# Patient Record
Sex: Male | Born: 1950 | Race: White | Hispanic: No | Marital: Married | Smoking: Current every day smoker
Health system: Southern US, Community
[De-identification: ages and names within clinical notes are randomized; demographics above are authoritative.]

## PROBLEM LIST (undated history)

## (undated) DIAGNOSIS — M109 Gout, unspecified: Secondary | ICD-10-CM

## (undated) DIAGNOSIS — E785 Hyperlipidemia, unspecified: Secondary | ICD-10-CM

## (undated) DIAGNOSIS — I480 Paroxysmal atrial fibrillation: Secondary | ICD-10-CM

## (undated) DIAGNOSIS — I10 Essential (primary) hypertension: Secondary | ICD-10-CM

## (undated) DIAGNOSIS — Z86010 Personal history of colonic polyps: Principal | ICD-10-CM

## (undated) DIAGNOSIS — Z87898 Personal history of other specified conditions: Secondary | ICD-10-CM

## (undated) DIAGNOSIS — E669 Obesity, unspecified: Secondary | ICD-10-CM

## (undated) DIAGNOSIS — R42 Dizziness and giddiness: Secondary | ICD-10-CM

## (undated) DIAGNOSIS — E119 Type 2 diabetes mellitus without complications: Secondary | ICD-10-CM

## (undated) DIAGNOSIS — H269 Unspecified cataract: Secondary | ICD-10-CM

## (undated) HISTORY — PX: CATARACT EXTRACTION: SUR2

## (undated) HISTORY — PX: CHOLECYSTECTOMY: SHX55

## (undated) HISTORY — DX: Essential (primary) hypertension: I10

## (undated) HISTORY — DX: Hyperlipidemia, unspecified: E78.5

## (undated) HISTORY — DX: Gout, unspecified: M10.9

## (undated) HISTORY — DX: Dizziness and giddiness: R42

## (undated) HISTORY — DX: Personal history of other specified conditions: Z87.898

## (undated) HISTORY — DX: Unspecified cataract: H26.9

## (undated) HISTORY — DX: Type 2 diabetes mellitus without complications: E11.9

## (undated) HISTORY — PX: COLONOSCOPY: SHX174

## (undated) HISTORY — DX: Personal history of colonic polyps: Z86.010

## (undated) HISTORY — DX: Paroxysmal atrial fibrillation: I48.0

## (undated) HISTORY — DX: Obesity, unspecified: E66.9

---

## 2000-06-05 ENCOUNTER — Encounter: Admission: RE | Admit: 2000-06-05 | Discharge: 2000-09-03 | Payer: Self-pay | Admitting: Internal Medicine

## 2004-07-06 ENCOUNTER — Ambulatory Visit: Payer: Self-pay | Admitting: Internal Medicine

## 2004-07-21 ENCOUNTER — Ambulatory Visit: Payer: Self-pay | Admitting: Internal Medicine

## 2005-01-25 ENCOUNTER — Ambulatory Visit: Payer: Self-pay | Admitting: Internal Medicine

## 2005-02-01 ENCOUNTER — Ambulatory Visit: Payer: Self-pay | Admitting: Internal Medicine

## 2005-11-09 ENCOUNTER — Ambulatory Visit: Payer: Self-pay | Admitting: Internal Medicine

## 2006-12-01 ENCOUNTER — Encounter: Payer: Self-pay | Admitting: Internal Medicine

## 2006-12-01 DIAGNOSIS — Z87898 Personal history of other specified conditions: Secondary | ICD-10-CM

## 2006-12-01 DIAGNOSIS — M109 Gout, unspecified: Secondary | ICD-10-CM

## 2006-12-01 DIAGNOSIS — I10 Essential (primary) hypertension: Secondary | ICD-10-CM

## 2006-12-01 HISTORY — DX: Essential (primary) hypertension: I10

## 2006-12-01 HISTORY — DX: Personal history of other specified conditions: Z87.898

## 2006-12-01 HISTORY — DX: Gout, unspecified: M10.9

## 2007-01-15 ENCOUNTER — Ambulatory Visit: Payer: Self-pay | Admitting: Internal Medicine

## 2007-01-17 LAB — CONVERTED CEMR LAB
Albumin: 3.6 g/dL (ref 3.5–5.2)
Basophils Absolute: 0 10*3/uL (ref 0.0–0.1)
Basophils Relative: 0.4 % (ref 0.0–1.0)
Bilirubin Urine: NEGATIVE
Bilirubin Urine: NEGATIVE
Bilirubin, Direct: 0.2 mg/dL (ref 0.0–0.3)
CO2: 29 meq/L (ref 19–32)
Cholesterol: 134 mg/dL (ref 0–200)
Cholesterol: 134 mg/dL (ref 0–200)
Creatinine, Ser: 1.2 mg/dL (ref 0.4–1.5)
Eosinophils Absolute: 0.1 10*3/uL (ref 0.0–0.6)
Eosinophils Relative: 2.1 % (ref 0.0–5.0)
GFR calc Af Amer: 80 mL/min
GFR calc Af Amer: 80 mL/min
Glucose, Bld: 129 mg/dL — ABNORMAL HIGH (ref 70–99)
HCT: 39.9 % (ref 39.0–52.0)
HDL: 37.5 mg/dL — ABNORMAL LOW (ref >39.0)
Hemoglobin: 13.9 g/dL (ref 13.0–17.0)
Hemoglobin: 13.9 g/dL (ref 13.0–17.0)
Leukocytes, UA: NEGATIVE
Leukocytes, UA: NEGATIVE
Lymphocytes Relative: 31.5 % (ref 12.0–46.0)
Lymphocytes Relative: 31.5 % (ref 12.0–46.0)
MCHC: 34.9 g/dL (ref 30.0–36.0)
MCV: 94.4 fL (ref 78.0–100.0)
Monocytes Absolute: 0.4 10*3/uL (ref 0.2–0.7)
Monocytes Absolute: 0.4 10*3/uL (ref 0.2–0.7)
Monocytes Relative: 7.3 % (ref 3.0–11.0)
Neutro Abs: 3.4 10*3/uL (ref 1.4–7.7)
Neutro Abs: 3.4 10*3/uL (ref 1.4–7.7)
Neutrophils Relative %: 58.7 % (ref 43.0–77.0)
Nitrite: NEGATIVE
PSA: 1.28 ng/mL (ref 0.10–4.00)
PSA: 1.28 ng/mL (ref 0.10–4.00)
Potassium: 3.7 meq/L (ref 3.5–5.1)
Potassium: 3.7 meq/L (ref 3.5–5.1)
RDW: 12.6 % (ref 11.5–14.6)
Sodium: 139 meq/L (ref 135–145)
Specific Gravity, Urine: >=1.03 (ref 1.000–1.03)
TSH: 2.29 microintl units/mL (ref 0.35–5.50)
TSH: 2.29 microintl units/mL (ref 0.35–5.50)
Total Bilirubin: 0.8 mg/dL (ref 0.3–1.2)
Total Protein, Urine: NEGATIVE mg/dL
Total Protein: 6.9 g/dL (ref 6.0–8.3)
Triglycerides: 150 mg/dL — ABNORMAL HIGH (ref 0–149)
Triglycerides: 150 mg/dL — ABNORMAL HIGH (ref 0–149)
Urobilinogen, UA: 0.2 (ref 0.0–1.0)
WBC: 5.7 10*3/uL (ref 4.5–10.5)

## 2007-01-19 ENCOUNTER — Ambulatory Visit: Payer: Self-pay | Admitting: Internal Medicine

## 2007-01-19 DIAGNOSIS — E785 Hyperlipidemia, unspecified: Secondary | ICD-10-CM

## 2007-01-19 HISTORY — DX: Hyperlipidemia, unspecified: E78.5

## 2007-07-27 ENCOUNTER — Ambulatory Visit: Payer: Self-pay | Admitting: Internal Medicine

## 2007-07-27 DIAGNOSIS — E1165 Type 2 diabetes mellitus with hyperglycemia: Secondary | ICD-10-CM

## 2007-07-27 DIAGNOSIS — E118 Type 2 diabetes mellitus with unspecified complications: Secondary | ICD-10-CM

## 2007-07-27 DIAGNOSIS — E119 Type 2 diabetes mellitus without complications: Secondary | ICD-10-CM

## 2007-07-27 HISTORY — DX: Type 2 diabetes mellitus without complications: E11.9

## 2007-11-19 ENCOUNTER — Encounter: Payer: Self-pay | Admitting: Internal Medicine

## 2008-01-21 ENCOUNTER — Ambulatory Visit: Payer: Self-pay | Admitting: Internal Medicine

## 2008-01-21 LAB — CONVERTED CEMR LAB
ALT: 16 units/L (ref 0–53)
AST: 20 units/L (ref 0–37)
Albumin: 3.6 g/dL (ref 3.5–5.2)
Alkaline Phosphatase: 53 units/L (ref 39–117)
BUN: 17 mg/dL (ref 6–23)
Bilirubin, Direct: 0.1 mg/dL (ref 0.0–0.3)
CO2: 27 meq/L (ref 19–32)
Chloride: 108 meq/L (ref 96–112)
Eosinophils Relative: 2.3 % (ref 0.0–5.0)
Glucose, Bld: 123 mg/dL — ABNORMAL HIGH (ref 70–99)
HCT: 38.9 % — ABNORMAL LOW (ref 39.0–52.0)
Leukocytes, UA: NEGATIVE
Lymphocytes Relative: 27.6 % (ref 12.0–46.0)
Monocytes Absolute: 0.4 10*3/uL (ref 0.1–1.0)
Monocytes Relative: 7.2 % (ref 3.0–12.0)
Neutrophils Relative %: 62.1 % (ref 43.0–77.0)
Nitrite: NEGATIVE
Platelets: 199 10*3/uL (ref 150–400)
Potassium: 4.4 meq/L (ref 3.5–5.1)
TSH: 2.13 microintl units/mL (ref 0.35–5.50)
Total Protein, Urine: NEGATIVE mg/dL
Total Protein: 7 g/dL (ref 6.0–8.3)
VLDL: 25 mg/dL (ref 0–40)
WBC: 5.2 10*3/uL (ref 4.5–10.5)

## 2008-01-24 ENCOUNTER — Ambulatory Visit: Payer: Self-pay | Admitting: Internal Medicine

## 2008-02-11 ENCOUNTER — Telehealth: Payer: Self-pay | Admitting: Internal Medicine

## 2008-03-05 ENCOUNTER — Encounter: Payer: Self-pay | Admitting: Internal Medicine

## 2008-05-26 ENCOUNTER — Ambulatory Visit: Payer: Self-pay | Admitting: Internal Medicine

## 2008-05-30 ENCOUNTER — Ambulatory Visit: Payer: Self-pay | Admitting: Internal Medicine

## 2008-05-30 ENCOUNTER — Encounter: Payer: Self-pay | Admitting: Internal Medicine

## 2008-05-30 DIAGNOSIS — Z860100 Personal history of colon polyps, unspecified: Secondary | ICD-10-CM | POA: Insufficient documentation

## 2008-05-30 HISTORY — DX: Personal history of colon polyps, unspecified: Z86.0100

## 2008-05-30 HISTORY — DX: Personal history of colonic polyps: Z86.010

## 2008-05-30 LAB — HM COLONOSCOPY

## 2008-06-03 ENCOUNTER — Encounter: Payer: Self-pay | Admitting: Internal Medicine

## 2008-07-22 ENCOUNTER — Ambulatory Visit: Payer: Self-pay | Admitting: Internal Medicine

## 2008-07-22 DIAGNOSIS — R21 Rash and other nonspecific skin eruption: Secondary | ICD-10-CM | POA: Insufficient documentation

## 2008-07-22 LAB — CONVERTED CEMR LAB
CO2: 30 meq/L (ref 19–32)
Chloride: 106 meq/L (ref 96–112)
Cholesterol: 127 mg/dL (ref 0–200)
HDL: 45 mg/dL (ref >39.00)
Hgb A1c MFr Bld: 6.6 % — ABNORMAL HIGH (ref 4.6–6.5)
LDL Cholesterol: 58 mg/dL (ref 0–99)
Sodium: 141 meq/L (ref 135–145)
Total CHOL/HDL Ratio: 3
Triglycerides: 118 mg/dL (ref 0.0–149.0)

## 2009-01-14 ENCOUNTER — Ambulatory Visit: Payer: Self-pay | Admitting: Internal Medicine

## 2009-01-14 LAB — CONVERTED CEMR LAB
ALT: 13 units/L (ref 0–53)
AST: 17 units/L (ref 0–37)
Albumin: 3.9 g/dL (ref 3.5–5.2)
Alkaline Phosphatase: 60 units/L (ref 39–117)
BUN: 19 mg/dL (ref 6–23)
Basophils Absolute: 0 10*3/uL (ref 0.0–0.1)
Basophils Relative: 0.4 % (ref 0.0–3.0)
Bilirubin Urine: NEGATIVE
CO2: 29 meq/L (ref 19–32)
Calcium: 9.1 mg/dL (ref 8.4–10.5)
Chloride: 102 meq/L (ref 96–112)
Creatinine, Ser: 1.3 mg/dL (ref 0.4–1.5)
Eosinophils Relative: 1.4 % (ref 0.0–5.0)
HCT: 42.4 % (ref 39.0–52.0)
Hemoglobin, Urine: NEGATIVE
Hemoglobin: 14.2 g/dL (ref 13.0–17.0)
Hgb A1c MFr Bld: 6.3 % (ref 4.6–6.5)
Ketones, ur: NEGATIVE mg/dL
LDL Cholesterol: 76 mg/dL (ref 0–99)
Lymphocytes Relative: 26 % (ref 12.0–46.0)
Lymphs Abs: 1.7 10*3/uL (ref 0.7–4.0)
Microalb, Ur: 0.9 mg/dL (ref 0.0–1.9)
Monocytes Relative: 6 % (ref 3.0–12.0)
Neutro Abs: 4.2 10*3/uL (ref 1.4–7.7)
RBC: 4.4 M/uL (ref 4.22–5.81)
RDW: 12.6 % (ref 11.5–14.6)
TSH: 2.05 microintl units/mL (ref 0.35–5.50)
Total CHOL/HDL Ratio: 3
Total Protein, Urine: NEGATIVE mg/dL
Total Protein: 7 g/dL (ref 6.0–8.3)
Triglycerides: 131 mg/dL (ref 0.0–149.0)
Urine Glucose: NEGATIVE mg/dL
Urobilinogen, UA: 0.2 (ref 0.0–1.0)

## 2009-01-19 ENCOUNTER — Ambulatory Visit: Payer: Self-pay | Admitting: Internal Medicine

## 2009-07-14 ENCOUNTER — Ambulatory Visit: Payer: Self-pay | Admitting: Internal Medicine

## 2009-07-14 LAB — CONVERTED CEMR LAB
CO2: 30 meq/L (ref 19–32)
Calcium: 9.3 mg/dL (ref 8.4–10.5)
Glucose, Bld: 121 mg/dL — ABNORMAL HIGH (ref 70–99)
HDL: 46.4 mg/dL (ref >39.00)
Potassium: 4.2 meq/L (ref 3.5–5.1)
Sodium: 142 meq/L (ref 135–145)
Total CHOL/HDL Ratio: 3
Triglycerides: 176 mg/dL — ABNORMAL HIGH (ref 0.0–149.0)
VLDL: 35.2 mg/dL (ref 0.0–40.0)

## 2009-07-20 ENCOUNTER — Ambulatory Visit: Payer: Self-pay | Admitting: Internal Medicine

## 2010-01-13 ENCOUNTER — Ambulatory Visit: Payer: Self-pay | Admitting: Internal Medicine

## 2010-01-13 LAB — CONVERTED CEMR LAB
Basophils Absolute: 0 10*3/uL (ref 0.0–0.1)
Bilirubin Urine: NEGATIVE
Bilirubin, Direct: 0.1 mg/dL (ref 0.0–0.3)
Calcium: 9.1 mg/dL (ref 8.4–10.5)
Cholesterol: 132 mg/dL (ref 0–200)
Creatinine, Ser: 1.2 mg/dL (ref 0.4–1.5)
Eosinophils Absolute: 0.1 10*3/uL (ref 0.0–0.7)
GFR calc non Af Amer: 62.95 mL/min (ref >60)
HCT: 39.8 % (ref 39.0–52.0)
HDL: 37.1 mg/dL — ABNORMAL LOW (ref >39.00)
Hemoglobin: 13.8 g/dL (ref 13.0–17.0)
Hgb A1c MFr Bld: 7.5 % — ABNORMAL HIGH (ref 4.6–6.5)
Ketones, ur: NEGATIVE mg/dL
LDL Cholesterol: 63 mg/dL (ref 0–99)
Lymphocytes Relative: 24.3 % (ref 12.0–46.0)
Lymphs Abs: 1.6 10*3/uL (ref 0.7–4.0)
MCHC: 34.6 g/dL (ref 30.0–36.0)
Microalb Creat Ratio: 2 mg/g (ref 0.0–30.0)
Neutro Abs: 4.6 10*3/uL (ref 1.4–7.7)
Platelets: 227 10*3/uL (ref 150.0–400.0)
RDW: 12.6 % (ref 11.5–14.6)
Sodium: 140 meq/L (ref 135–145)
Specific Gravity, Urine: 1.02 (ref 1.000–1.030)
Total Bilirubin: 0.7 mg/dL (ref 0.3–1.2)
Triglycerides: 161 mg/dL — ABNORMAL HIGH (ref 0.0–149.0)
Urine Glucose: NEGATIVE mg/dL
Urobilinogen, UA: 0.2 (ref 0.0–1.0)
VLDL: 32.2 mg/dL (ref 0.0–40.0)

## 2010-01-19 ENCOUNTER — Encounter: Payer: Self-pay | Admitting: Internal Medicine

## 2010-01-19 ENCOUNTER — Ambulatory Visit: Payer: Self-pay | Admitting: Internal Medicine

## 2010-02-11 ENCOUNTER — Ambulatory Visit: Payer: Self-pay | Admitting: Internal Medicine

## 2010-02-11 ENCOUNTER — Inpatient Hospital Stay (HOSPITAL_COMMUNITY): Admission: EM | Admit: 2010-02-11 | Discharge: 2010-02-15 | Payer: Self-pay | Source: Home / Self Care

## 2010-02-11 DIAGNOSIS — R42 Dizziness and giddiness: Secondary | ICD-10-CM

## 2010-02-11 DIAGNOSIS — R1011 Right upper quadrant pain: Secondary | ICD-10-CM

## 2010-02-11 HISTORY — DX: Dizziness and giddiness: R42

## 2010-02-12 ENCOUNTER — Encounter (INDEPENDENT_AMBULATORY_CARE_PROVIDER_SITE_OTHER): Payer: Self-pay

## 2010-02-14 ENCOUNTER — Encounter (INDEPENDENT_AMBULATORY_CARE_PROVIDER_SITE_OTHER): Payer: Self-pay | Admitting: Cardiology

## 2010-02-23 ENCOUNTER — Encounter: Payer: Self-pay | Admitting: Internal Medicine

## 2010-02-23 ENCOUNTER — Encounter: Payer: Self-pay | Admitting: Cardiology

## 2010-03-09 ENCOUNTER — Encounter: Payer: Self-pay | Admitting: Cardiology

## 2010-03-09 ENCOUNTER — Ambulatory Visit
Admission: RE | Admit: 2010-03-09 | Discharge: 2010-03-09 | Payer: Self-pay | Source: Home / Self Care | Attending: Cardiology | Admitting: Cardiology

## 2010-03-09 DIAGNOSIS — I4891 Unspecified atrial fibrillation: Secondary | ICD-10-CM | POA: Insufficient documentation

## 2010-04-06 NOTE — Assessment & Plan Note (Signed)
Summary: vomiting,diarrhea/#/cd   Vital Signs:  Patient profile:   60 year old male Height:      75 inches Weight:      330.38 pounds BMI:     41.44 O2 Sat:      96 % on Room air Temp:     98.9 degrees F oral Pulse rate:   61 / minute BP supine:   132 / 82 BP sitting:   118 / 78  (left arm) BP standing:   110 / 80 Cuff size:   large  Vitals Entered By: Zella Ball Ewing CMA Duncan Dull) (February 11, 2010 9:16 AM)  O2 Flow:  Room air CC: Vomiting, shoulder pain, dizziness and no appetiteRE   CC:  Vomiting, shoulder pain, and dizziness and no appetiteRE.  History of Present Illness: here with symptoms that starting with upper abd pressure and belching the night before last after dinner,  drank a coke with vomitied 30 min later, assoc with a small pice of dinner and ? bile;  later still felt naueas and made himself vomit again but nausea persisted, finanlly got ot sleep about 2AM,  yesterday morning, had onset aching in the upper mid abd, right mid abd /side, and near the right shoulder blade;  pregressively worse ;  no position makes better;  little appetite, not eating for 2 days, very little sleep, dizzy to stand up, only drinking smal sips of coke;  no fever, no bowel change except one episode of losse stool, no blood, but discomfort has been constant, persistent and gradually worse.  Feels terrible.  Hot water may have helped the right shoulder blade area this am.  Pt denies CP, worsening sob, doe, wheezing, orthopnea, pnd, worsening LE edema, palps, dizziness or syncope  Pt denies new neuro symptoms such as headache, facial or extremity weakness.  No recnet gout attack.  No fever,  night sweats, loss of appetite or other constitutional symptoms , but has lost 3 lbs in the past 2 days.  No reflux, did not try antacids.  No rash, Pt denies polydipsia, polyuria, or low sugar symptoms such as shakiness improved with eating.  Overall good compliance with meds, trying to follow low chol, DM diet, little  excercise however   Problems Prior to Update: 1)  Rash-nonvesicular  (ICD-782.1) 2)  Rash-nonvesicular  (ICD-782.1) 3)  Preventive Health Care  (ICD-V70.0) 4)  Diabetes Mellitus, Type II  (ICD-250.00) 5)  Preventive Health Care  (ICD-V70.0) 6)  Family History of Cad Male 1st Degree Relative <50  (ICD-V17.3) 7)  Hyperlipidemia  (ICD-272.4) 8)  Special Screening Malignant Neoplasm of Prostate  (ICD-V76.44) 9)  Routine General Medical Exam@[facility]  (ICD-V70.0) 10)  Gout  (ICD-274.9) 11)  Obesity, Hx of  (ICD-V13.8) 12)  Hypertension  (ICD-401.9)  Medications Prior to Update: 1)  Kombiglyze Xr 5-500 Mg Xr24h-Tab (Saxagliptin-Metformin) .Marland Kitchen.. 1po Once Daily 2)  Crestor 20 Mg  Tabs (Rosuvastatin Calcium) .Marland Kitchen.. 1po Once Daily 3)  Indocin Sr 75 Mg Cpcr (Indomethacin) .Marland Kitchen.. 1 By Mouth Two Times A Day As Needed 4)  Lisinopril-Hydrochlorothiazide 20-12.5 Mg Tabs (Lisinopril-Hydrochlorothiazide) .... 2 By Mouth Once Daily 5)  Zetia 10 Mg Tabs (Ezetimibe) .Marland Kitchen.. 1po Once Daily 6)  Ecotrin Low Strength 81 Mg  Tbec (Aspirin) .Marland Kitchen.. 1po Qd 7)  Allopurinol 100 Mg Tabs (Allopurinol) .Marland Kitchen.. 1po Once Daily 8)  Onetouch Test  Strp (Glucose Blood) .... Use Asd 1 Once Daily  Current Medications (verified): 1)  Kombiglyze Xr 5-500 Mg Xr24h-Tab (Saxagliptin-Metformin) .Marland KitchenMarland KitchenMarland Kitchen  1po Once Daily 2)  Crestor 20 Mg  Tabs (Rosuvastatin Calcium) .Marland Kitchen.. 1po Once Daily 3)  Indocin Sr 75 Mg Cpcr (Indomethacin) .Marland Kitchen.. 1 By Mouth Two Times A Day As Needed 4)  Lisinopril-Hydrochlorothiazide 20-12.5 Mg Tabs (Lisinopril-Hydrochlorothiazide) .... 2 By Mouth Once Daily 5)  Zetia 10 Mg Tabs (Ezetimibe) .Marland Kitchen.. 1po Once Daily 6)  Ecotrin Low Strength 81 Mg  Tbec (Aspirin) .Marland Kitchen.. 1po Qd 7)  Allopurinol 100 Mg Tabs (Allopurinol) .Marland Kitchen.. 1po Once Daily 8)  Onetouch Test  Strp (Glucose Blood) .... Use Asd 1 Once Daily  Allergies (verified): 1)  ! * Bee Stings  Past History:  Past Medical History: Last updated:  07/27/2007 Hypertension Gout Obesity Hyperlipidemia Diabetes mellitus, type II  Past Surgical History: Last updated: 12/02/2006 Denies surgical history  Family History: Last updated: 01/24/2008 Family History of CAD - father with MI, died 52yo mother with cervical cancer  Social History: Last updated: 01/19/2010 Current Smoker - occas pipe Alcohol use-yes - 2 -3 beer/day Married 2 children work - Airline pilot, Chief of Staff business as well Drug use-no  Risk Factors: Smoking Status: current (01/19/2007) Packs/Day: Pipe (12/02/2006)  Review of Systems  The patient denies anorexia, fever, vision loss, decreased hearing, hoarseness, chest pain, syncope, dyspnea on exertion, peripheral edema, prolonged cough, headaches, hemoptysis, melena, hematochezia, severe indigestion/heartburn, hematuria, muscle weakness, suspicious skin lesions, depression, abnormal bleeding, enlarged lymph nodes, and angioedema.         all otherwise negative per pt -    Physical Exam  General:  alert and overweight-appearing.  , mod ill appearing Head:  normocephalic and atraumatic.   Eyes:  vision grossly intact, pupils equal, and pupils round.   Ears:  R ear normal and L ear normal.   Nose:  no external deformity and no nasal discharge.   Mouth:  no gingival abnormalities and pharynx pink and moist.   Neck:  supple and no masses.   Lungs:  normal respiratory effort and normal breath sounds.   Heart:  normal rate and regular rhythm.   Abdomen:  soft. , decreased BS with mod to severe RUQ tender, without guarding or rebound Msk:  no acute joint tenderness and no joint swelling.   Extremities:  no edema, no erythema  Neurologic:  strength normal in all extremities and gait normal., but still due to abd pain, and groans to lie down and sit up  Skin:  color normal and no rashes.   Psych:  normally interactive and slightly anxious.     Impression & Recommendations:  Problem # 1:  RUQ PAIN  (ICD-789.01) high suspicion for acute cholecystitis that should be eval and consdiered for more urgent management;  will ask pt to present to ER for labs/ct or u/s, and possible surgury consult  Problem # 2:  POSTURAL LIGHTHEADEDNESS (ICD-780.4) with decreased by mouth intake for 2 days; c/w mild volume depletion;  orthostatics as above;  would likely benefit from IVF;s as well  Problem # 3:  DIABETES MELLITUS, TYPE II (ICD-250.00)  His updated medication list for this problem includes:    Kombiglyze Xr 5-500 Mg Xr24h-tab (Saxagliptin-metformin) .Marland Kitchen... 1po once daily    Lisinopril-hydrochlorothiazide 20-12.5 Mg Tabs (Lisinopril-hydrochlorothiazide) .Marland Kitchen... 2 by mouth once daily    Ecotrin Low Strength 81 Mg Tbec (Aspirin) .Marland Kitchen... 1po qd  Labs Reviewed: Creat: 1.2 (01/13/2010)    Reviewed HgBA1c results: 7.5 (01/13/2010)  6.6 (07/14/2009) stable overall by hx and exam, ok to continue meds/tx as is   Problem # 4:  HYPERTENSION (ICD-401.9)  His updated medication list for this problem includes:    Lisinopril-hydrochlorothiazide 20-12.5 Mg Tabs (Lisinopril-hydrochlorothiazide) .Marland Kitchen... 2 by mouth once daily should hold BP med for now  BP today: 118/78 Prior BP: 120/82 (01/19/2010)  Labs Reviewed: K+: 4.2 (01/13/2010) Creat: : 1.2 (01/13/2010)   Chol: 132 (01/13/2010)   HDL: 37.10 (01/13/2010)   LDL: 63 (01/13/2010)   TG: 161.0 (01/13/2010)  Complete Medication List: 1)  Kombiglyze Xr 5-500 Mg Xr24h-tab (Saxagliptin-metformin) .Marland Kitchen.. 1po once daily 2)  Crestor 20 Mg Tabs (Rosuvastatin calcium) .Marland Kitchen.. 1po once daily 3)  Indocin Sr 75 Mg Cpcr (Indomethacin) .Marland Kitchen.. 1 by mouth two times a day as needed 4)  Lisinopril-hydrochlorothiazide 20-12.5 Mg Tabs (Lisinopril-hydrochlorothiazide) .... 2 by mouth once daily 5)  Zetia 10 Mg Tabs (Ezetimibe) .Marland Kitchen.. 1po once daily 6)  Ecotrin Low Strength 81 Mg Tbec (Aspirin) .Marland Kitchen.. 1po qd 7)  Allopurinol 100 Mg Tabs (Allopurinol) .Marland Kitchen.. 1po once daily 8)  Onetouch Test  Strp (Glucose blood) .... Use asd 1 once daily  Patient Instructions: 1)  You have dehydration by examination today, with pain should be further evaluated urgently by CT scan or ultrasound 2)  Please go to ER ([facility] or [facility]) for further evaluation and management 3)  Please hold your Blood Pressure pills until further advised to take them 4)  Please schedule a follow-up appointment as needed.   Orders Added: 1)  Est. Patient Level V [16109]

## 2010-04-06 NOTE — Assessment & Plan Note (Signed)
Summary: CPX/NWS  #   Vital Signs:  Patient profile:   60 year old male Height:      75 inches Weight:      338.75 pounds BMI:     42.49 O2 Sat:      96 % on Room air Temp:     97.8 degrees F oral Pulse rate:   56 / minute BP sitting:   120 / 82  (left arm) Cuff size:   large  Vitals Entered By: Zella Ball Ewing CMA [facility] Dull) (January 19, 2010 8:41 AM)  O2 Flow:  Room air  CC: Adult Physical/RE   CC:  Adult Physical/RE.  History of Present Illness: here for wellness and f/u;  Pt denies CP, worsening sob, doe, wheezing, orthopnea, pnd, worsening LE edema, palps, dizziness or syncope  Pt denies new neuro symptoms such as headache, facial or extremity weakness  Pt denies polydipsia, polyuria, or low sugar symptoms such as shakiness improved with eating.  Overall good compliance with meds, trying to follow low chol, DM diet, wt stable, little excercise however Denies worsening depressive symptoms, suicidal ideation, or panic.  No fever, wt loss, night sweats, loss of appetite or other constitutional symptoms  Overall good compliance with meds, and good tolerability.  Pt states good ability with ADL's, low fall risk, home safety reviewed and adequate, no significant change in hearing or vision, trying to follow lower chol diet, and occasionally active only with regular excercise.  Also with some sweling to 3rd finger left hand PIP c/w gout which cont to try to recur.  No trauma or fever.   Preventive Screening-Counseling & Management      Drug Use:  no.    Problems Prior to Update: 1)  Rash-nonvesicular  (ICD-782.1) 2)  Rash-nonvesicular  (ICD-782.1) 3)  Preventive Health Care  (ICD-V70.0) 4)  Diabetes Mellitus, Type II  (ICD-250.00) 5)  Preventive Health Care  (ICD-V70.0) 6)  Family History of Cad Male 1st Degree Relative <50  (ICD-V17.3) 7)  Hyperlipidemia  (ICD-272.4) 8)  Special Screening Malignant Neoplasm of Prostate  (ICD-V76.44) 9)  Routine General Medical Exam@[facility]   (ICD-V70.0) 10)  Gout  (ICD-274.9) 11)  Obesity, Hx of  (ICD-V13.8) 12)  Hypertension  (ICD-401.9)  Medications Prior to Update: 1)  Actos 45 Mg Tabs (Pioglitazone Hcl) .Marland Kitchen.. 1 By Mouth Qd 2)  Crestor 20 Mg  Tabs (Rosuvastatin Calcium) .Marland Kitchen.. 1po Qd 3)  Indocin Sr 75 Mg Cpcr (Indomethacin) .Marland Kitchen.. 1 By Mouth Two Times A Day As Needed 4)  Lisinopril-Hydrochlorothiazide 20-12.5 Mg Tabs (Lisinopril-Hydrochlorothiazide) .... 2 By Mouth Qd 5)  Zetia 10 Mg Tabs (Ezetimibe) .Marland Kitchen.. 1po Qd 6)  Ecotrin Low Strength 81 Mg  Tbec (Aspirin) .Marland Kitchen.. 1po Qd 7)  Ketoconazole 200 Mg Tabs (Ketoconazole) .Marland Kitchen.. 1 By Mouth Two Times A Day For 7 Days 8)  Nystatin  Powd (Nystatin) .... Use Asd Two Times A Day As Needed  Current Medications (verified): 1)  Kombiglyze Xr 5-500 Mg Xr24h-Tab (Saxagliptin-Metformin) .Marland Kitchen.. 1po Once Daily 2)  Crestor 20 Mg  Tabs (Rosuvastatin Calcium) .Marland Kitchen.. 1po Once Daily 3)  Indocin Sr 75 Mg Cpcr (Indomethacin) .Marland Kitchen.. 1 By Mouth Two Times A Day As Needed 4)  Lisinopril-Hydrochlorothiazide 20-12.5 Mg Tabs (Lisinopril-Hydrochlorothiazide) .... 2 By Mouth Once Daily 5)  Zetia 10 Mg Tabs (Ezetimibe) .Marland Kitchen.. 1po Once Daily 6)  Ecotrin Low Strength 81 Mg  Tbec (Aspirin) .Marland Kitchen.. 1po Qd 7)  Allopurinol 100 Mg Tabs (Allopurinol) .Marland Kitchen.. 1po Once Daily 8)  Onetouch Test  Strp (Glucose Blood) .... Use Asd 1 Once Daily  Allergies (verified): 1)  ! * Bee Stings  Past History:  Past Medical History: Last updated: 07/27/2007 Hypertension Gout Obesity Hyperlipidemia Diabetes mellitus, type II  Past Surgical History: Last updated: 12/02/2006 Denies surgical history  Family History: Last updated: 01/24/2008 Family History of CAD - father with MI, died 52yo mother with cervical cancer  Social History: Last updated: 01/19/2010 Current Smoker - occas pipe Alcohol use-yes - 2 -3 beer/day Married 2 children work - Airline pilot, Chief of Staff business as well Drug use-no  Risk Factors: Smoking  Status: current (01/19/2007) Packs/Day: Pipe (12/02/2006)  Social History: Current Smoker - occas pipe Alcohol use-yes - 2 -3 beer/day Married 2 children work - Airline pilot, Chief of Staff business as well Drug use-no Drug Use:  no  Review of Systems  The patient denies anorexia, fever, vision loss, decreased hearing, hoarseness, chest pain, syncope, dyspnea on exertion, peripheral edema, prolonged cough, headaches, hemoptysis, abdominal pain, melena, hematochezia, severe indigestion/heartburn, hematuria, muscle weakness, suspicious skin lesions, transient blindness, difficulty walking, depression, unusual weight change, abnormal bleeding, enlarged lymph nodes, and angioedema.         all otherwise negative per pt -    Physical Exam  General:  alert and overweight-appearing.   Head:  normocephalic and atraumatic.   Eyes:  vision grossly intact, pupils equal, and pupils round.   Ears:  R ear normal and L ear normal.   Nose:  no external deformity and no nasal discharge.   Mouth:  no gingival abnormalities and pharynx pink and moist.   Neck:  supple and no masses.   Lungs:  normal respiratory effort and normal breath sounds.   Heart:  normal rate and regular rhythm.   Abdomen:  soft, non-tender, and normal bowel sounds.   Msk:  no joint tenderness and no joint swelling.  except for third finger left hand PIP mild swelling Extremities:  no edema, no erythema  Neurologic:  cranial nerves II-XII intact and strength normal in all extremities.   Skin:  color normal and no rashes.   Psych:  not depressed appearing and slightly anxious.     Impression & Recommendations:  Problem # 1:  Preventive Health Care (ICD-V70.0) Overall doing well, age appropriate education and counseling updated, referral for preventive services and immunizations addressed, dietary counseling and smoking status adressed , most recent labs reviewed, ecg reviewed I have personally reviewed and have  noted 1.The patient's medical and social history 2.Their use of alcohol, tobacco or illicit drugs 3.Their current medications and supplements 4. Functional ability including ADL's, fall risk, home safety risk, hearing & visual impairment  5.Diet and physical activities 6.Evidence for depression or mood disorders The patients weight, height, BMI  have been recorded in the chart I have made referrals, counseling and provided education to the patient based review of the above  Orders: EKG w/ Interpretation (93000)  Problem # 2:  DIABETES MELLITUS, TYPE II (ICD-250.00)  His updated medication list for this problem includes:    Kombiglyze Xr 5-500 Mg Xr24h-tab (Saxagliptin-metformin) .Marland Kitchen... 1po once daily    Lisinopril-hydrochlorothiazide 20-12.5 Mg Tabs (Lisinopril-hydrochlorothiazide) .Marland Kitchen... 2 by mouth once daily    Ecotrin Low Strength 81 Mg Tbec (Aspirin) .Marland Kitchen... 1po qd  Labs Reviewed: Creat: 1.2 (01/13/2010)    Reviewed HgBA1c results: 7.5 (01/13/2010)  6.6 (07/14/2009) treat as above, f/u any worsening signs or symptoms   Problem # 3:  GOUT (ICD-274.9)  His updated medication list for this  problem includes:    Allopurinol 100 Mg Tabs (Allopurinol) .Marland Kitchen... 1po once daily treat as above, f/u any worsening signs or symptoms  - for prevention  Complete Medication List: 1)  Kombiglyze Xr 5-500 Mg Xr24h-tab (Saxagliptin-metformin) .Marland Kitchen.. 1po once daily 2)  Crestor 20 Mg Tabs (Rosuvastatin calcium) .Marland Kitchen.. 1po once daily 3)  Indocin Sr 75 Mg Cpcr (Indomethacin) .Marland Kitchen.. 1 by mouth two times a day as needed 4)  Lisinopril-hydrochlorothiazide 20-12.5 Mg Tabs (Lisinopril-hydrochlorothiazide) .... 2 by mouth once daily 5)  Zetia 10 Mg Tabs (Ezetimibe) .Marland Kitchen.. 1po once daily 6)  Ecotrin Low Strength 81 Mg Tbec (Aspirin) .Marland Kitchen.. 1po qd 7)  Allopurinol 100 Mg Tabs (Allopurinol) .Marland Kitchen.. 1po once daily 8)  Onetouch Test Strp (Glucose blood) .... Use asd 1 once daily  Other Orders: Admin 1st Vaccine (85885) Flu  Vaccine 84yrs + (02774) Zoster (Shingles) Vaccine Live [id number]) Admin of Any Addtl Vaccine (67672)  Patient Instructions: 1)  you had the flu shot today 2)  stop the actos 3)  start the new komblygiyze ER 4)  you are given the new glucometer and supplies 5)  please check your sugars once to twice per day and call if consistently over 175 6)  you had the shingles shot today 7)  Continue all other previous medications as before this visit 8)  Please schedule a follow-up appointment in 6 months with: 9)  BMP prior to visit, ICD-9:250.02 10)  Lipid Panel prior to visit, ICD-9: 11)  HbgA1C prior to visit, ICD-9: Prescriptions: ONETOUCH TEST  STRP (GLUCOSE BLOOD) use asd 1 once daily  #100 x 3   Entered and Authorized by:   Corwin Levins MD   Signed by:   Corwin Levins MD on 01/19/2010   Method used:   Print then Give to Patient   RxID:   [id number] ZETIA 10 MG TABS (EZETIMIBE) 1po once daily  #90 x 3   Entered and Authorized by:   Corwin Levins MD   Signed by:   Corwin Levins MD on 01/19/2010   Method used:   Print then Give to Patient   RxID:   [id number] LISINOPRIL-HYDROCHLOROTHIAZIDE 20-12.5 MG TABS (LISINOPRIL-HYDROCHLOROTHIAZIDE) 2 by mouth once daily  #180 x 3   Entered and Authorized by:   Corwin Levins MD   Signed by:   Corwin Levins MD on 01/19/2010   Method used:   Print then Give to Patient   RxID:   [id number] INDOCIN SR 75 MG CPCR (INDOMETHACIN) 1 by mouth two times a day as needed  #60 x 11   Entered and Authorized by:   Corwin Levins MD   Signed by:   Corwin Levins MD on 01/19/2010   Method used:   Print then Give to Patient   RxID:   [id number] CRESTOR 20 MG  TABS (ROSUVASTATIN CALCIUM) 1po once daily  #90 x 3   Entered and Authorized by:   Corwin Levins MD   Signed by:   Corwin Levins MD on 01/19/2010   Method used:   Print then Give to Patient   RxID:   [id number] KOMBIGLYZE XR 5-500 MG XR24H-TAB (SAXAGLIPTIN-METFORMIN) 1po once daily  #90 x  3   Entered and Authorized by:   Corwin Levins MD   Signed by:   Corwin Levins MD on 01/19/2010   Method used:   Print then Give to Patient   RxID:   [id number] ALLOPURINOL 100 MG TABS (ALLOPURINOL) 1po  once daily  #90 x 3   Entered and Authorized by:   Corwin Levins MD   Signed by:   Corwin Levins MD on 01/19/2010   Method used:   Print then Give to Patient   RxID:   [id number] KOMBIGLYZE XR 5-500 MG XR24H-TAB (SAXAGLIPTIN-METFORMIN) 1po once daily  #30 x 11   Entered and Authorized by:   Corwin Levins MD   Signed by:   Corwin Levins MD on 01/19/2010   Method used:   Print then Give to Patient   RxID:   [id number]    Orders Added: 1)  Admin 1st Vaccine [90471] 2)  Flu Vaccine 39yrs + [84696] 3)  EKG w/ Interpretation [93000] 4)  Zoster (Shingles) Vaccine Live [90736] 5)  Admin of Any Addtl Vaccine [29528]   Immunizations Administered:  Zostavax # 1:    Vaccine Type: Zostavax    Site: right deltoid    Mfr: Merck    Dose: 0.5 ml    Route: [place]    Given by: Zella Ball Ewing CMA (AAMA)    Exp. Date: 10/09/2010    Lot #: [id number]    VIS given: 12/17/04 given January 19, 2010.  Flu Vaccine Consent Questions     Do you have a history of severe allergic reactions to this vaccine? no    Any prior history of allergic reactions to egg and/or gelatin? no    Do you have a sensitivity to the preservative Thimersol? no    Do you have a past history of Guillan-Barre Syndrome? no    Do you currently have an acute febrile illness? no    Have you ever had a severe reaction to latex? no    Vaccine information given and explained to patient? yes    Are you currently pregnant? no    Lot Number:[id number]   Exp Date:09/04/2010   Site Given  Left Deltoid IM  Immunizations Administered:  Zostavax # 1:    Vaccine Type: Zostavax    Site: right deltoid    Mfr: Merck    Dose: 0.5 ml    Route: [place]    Given by: Zella Ball Ewing CMA (AAMA)    Exp. Date: 10/09/2010    Lot #: [id number]    VIS  given: 12/17/04 given January 19, 2010.        Marland [email or web address]

## 2010-04-06 NOTE — Assessment & Plan Note (Signed)
Summary: 6 MO ROV /NWS  #   Vital Signs:  Patient profile:   60 year old male Height:      75 inches Weight:      344.50 pounds BMI:     43.22 O2 Sat:      97 % on Room air Temp:     97.8 degrees F oral Pulse rate:   61 / minute BP sitting:   120 / 82  (left arm) Cuff size:   large  Vitals Entered ByZella Ball Ewing (Jul 20, 2009 8:02 AM)  O2 Flow:  Room air CC: 6 Month ROV/RE   CC:  6 Month ROV/RE.  History of Present Illness: overall doing well;  Pt denies CP, sob, doe, wheezing, orthopnea, pnd, worsening LE edema, palps, dizziness or syncope   Pt denies new neuro symptoms such as headache, facial or extremity weakness   Pt denies polydipsia, polyuria, or low sugar symptoms such as shakiness improved with eating.  Overall good compliance with meds, trying to follow low chol, DM diet, wt stable, little excercise however   CBG's in low 100's.  No new complaints. Has significant itchy rash to inguinal areas and right scrotum with itch and mild discomfort for several months gradually worsening, without trauma or fever.  No GU symptoms such as dysuria or urgency.    Problems Prior to Update: 1)  Rash-nonvesicular  (ICD-782.1) 2)  Rash-nonvesicular  (ICD-782.1) 3)  Preventive Health Care  (ICD-V70.0) 4)  Diabetes Mellitus, Type II  (ICD-250.00) 5)  Preventive Health Care  (ICD-V70.0) 6)  Family History of Cad Male 1st Degree Relative <50  (ICD-V17.3) 7)  Hyperlipidemia  (ICD-272.4) 8)  Special Screening Malignant Neoplasm of Prostate  (ICD-V76.44) 9)  Routine General Medical Exam@[facility]  (ICD-V70.0) 10)  Gout  (ICD-274.9) 11)  Obesity, Hx of  (ICD-V13.8) 12)  Hypertension  (ICD-401.9)  Medications Prior to Update: 1)  Actos 45 Mg Tabs (Pioglitazone Hcl) .Marland Kitchen.. 1 By Mouth Qd 2)  Crestor 20 Mg  Tabs (Rosuvastatin Calcium) .Marland Kitchen.. 1po Qd 3)  Indocin Sr 75 Mg Cpcr (Indomethacin) .Marland Kitchen.. 1 By Mouth Two Times A Day As Needed 4)  Lisinopril-Hydrochlorothiazide 20-12.5 Mg Tabs  (Lisinopril-Hydrochlorothiazide) .... 2 By Mouth Qd 5)  Zetia 10 Mg Tabs (Ezetimibe) .Marland Kitchen.. 1po Qd 6)  Ecotrin Low Strength 81 Mg  Tbec (Aspirin) .Marland Kitchen.. 1po Qd  Current Medications (verified): 1)  Actos 45 Mg Tabs (Pioglitazone Hcl) .Marland Kitchen.. 1 By Mouth Qd 2)  Crestor 20 Mg  Tabs (Rosuvastatin Calcium) .Marland Kitchen.. 1po Qd 3)  Indocin Sr 75 Mg Cpcr (Indomethacin) .Marland Kitchen.. 1 By Mouth Two Times A Day As Needed 4)  Lisinopril-Hydrochlorothiazide 20-12.5 Mg Tabs (Lisinopril-Hydrochlorothiazide) .... 2 By Mouth Qd 5)  Zetia 10 Mg Tabs (Ezetimibe) .Marland Kitchen.. 1po Qd 6)  Ecotrin Low Strength 81 Mg  Tbec (Aspirin) .Marland Kitchen.. 1po Qd 7)  Ketoconazole 200 Mg Tabs (Ketoconazole) .Marland Kitchen.. 1 By Mouth Two Times A Day For 7 Days 8)  Nystatin  Powd (Nystatin) .... Use Asd Two Times A Day As Needed  Allergies (verified): 1)  ! * Bee Stings  Past History:  Past Medical History: Last updated: 07/27/2007 Hypertension Gout Obesity Hyperlipidemia Diabetes mellitus, type II  Past Surgical History: Last updated: 12/02/2006 Denies surgical history  Social History: Last updated: 01/24/2008 Current Smoker - occas pipe Alcohol use-yes - 2 -3 beer/day Married 2 children work - Airline pilot, Chief of Staff business as well  Risk Factors: Smoking Status: current (01/19/2007) Packs/Day: Pipe (12/02/2006)  Review of  Systems       all otherwise negative per pt -    Physical Exam  General:  alert and overweight-appearing.   Head:  normocephalic and atraumatic.   Eyes:  vision grossly intact, pupils equal, and pupils round.   Ears:  R ear normal and L ear normal.   Nose:  no external deformity and no nasal discharge.   Mouth:  no gingival abnormalities and pharynx pink and moist.   Neck:  supple and no masses.   Lungs:  normal respiratory effort and normal breath sounds.   Heart:  normal rate and regular rhythm.   Extremities:  no edema, no erythema  Skin:  nontender erythema to right inguinal area and right scrotum ,  nondiscrete without swelling or ulcer   Impression & Recommendations:  Problem # 1:  RASH-NONVESICULAR (ICD-782.1)  bilat groin and right scrotum  His updated medication list for this problem includes:    Nystatin Powd (Nystatin) ..... Use asd two times a day as needed  Problem # 2:  HYPERTENSION (ICD-401.9)  His updated medication list for this problem includes:    Lisinopril-hydrochlorothiazide 20-12.5 Mg Tabs (Lisinopril-hydrochlorothiazide) .Marland Kitchen... 2 by mouth qd  BP today: 120/82 Prior BP: 142/84 (01/19/2009)  Labs Reviewed: K+: 4.2 (07/14/2009) Creat: : 1.1 (07/14/2009)   Chol: 151 (07/14/2009)   HDL: 46.40 (07/14/2009)   LDL: 69 (07/14/2009)   TG: 176.0 (07/14/2009) stable overall by hx and exam, ok to continue meds/tx as is   Problem # 3:  DIABETES MELLITUS, TYPE II (ICD-250.00)  His updated medication list for this problem includes:    Actos 45 Mg Tabs (Pioglitazone hcl) .Marland Kitchen... 1 by mouth qd    Lisinopril-hydrochlorothiazide 20-12.5 Mg Tabs (Lisinopril-hydrochlorothiazide) .Marland Kitchen... 2 by mouth qd    Ecotrin Low Strength 81 Mg Tbec (Aspirin) .Marland Kitchen... 1po qd  Labs Reviewed: Creat: 1.1 (07/14/2009)    Reviewed HgBA1c results: 6.6 (07/14/2009)  6.3 (01/14/2009) stable overall by hx and exam, ok to continue meds/tx as is   Problem # 4:  HYPERLIPIDEMIA (ICD-272.4)  His updated medication list for this problem includes:    Crestor 20 Mg Tabs (Rosuvastatin calcium) .Marland Kitchen... 1po qd    Zetia 10 Mg Tabs (Ezetimibe) .Marland Kitchen... 1po qd  Labs Reviewed: SGOT: 17 (01/14/2009)   SGPT: 13 (01/14/2009)   HDL:46.40 (07/14/2009), 44.30 (01/14/2009)  LDL:69 (07/14/2009), 76 (01/14/2009)  Chol:151 (07/14/2009), 146 (01/14/2009)  Trig:176.0 (07/14/2009), 131.0 (01/14/2009) stable overall by hx and exam, ok to continue meds/tx as is   Complete Medication List: 1)  Actos 45 Mg Tabs (Pioglitazone hcl) .Marland Kitchen.. 1 by mouth qd 2)  Crestor 20 Mg Tabs (Rosuvastatin calcium) .Marland Kitchen.. 1po qd 3)  Indocin Sr 75 Mg Cpcr  (Indomethacin) .Marland Kitchen.. 1 by mouth two times a day as needed 4)  Lisinopril-hydrochlorothiazide 20-12.5 Mg Tabs (Lisinopril-hydrochlorothiazide) .... 2 by mouth qd 5)  Zetia 10 Mg Tabs (Ezetimibe) .Marland Kitchen.. 1po qd 6)  Ecotrin Low Strength 81 Mg Tbec (Aspirin) .Marland Kitchen.. 1po qd 7)  Ketoconazole 200 Mg Tabs (Ketoconazole) .Marland Kitchen.. 1 by mouth two times a day for 7 days 8)  Nystatin Powd (Nystatin) .... Use asd two times a day as needed  Patient Instructions: 1)  Please take all new medications as prescribed 2)  Continue all previous medications as before this visit  3)  Please schedule a follow-up appointment in 6 months with CPX labs and: 4)  HbgA1C prior to visit, ICD-9: 250.02 5)  Urine Microalbumin prior to visit, ICD-9: Prescriptions: NYSTATIN  POWD (NYSTATIN) use asd  two times a day as needed  #1 x 2   Entered and Authorized by:   Corwin Levins MD   Signed by:   Corwin Levins MD on 07/20/2009   Method used:   Print then Give to Patient   RxID:   [id number] KETOCONAZOLE 200 MG TABS (KETOCONAZOLE) 1 by mouth two times a day for 7 days  #14 x 0   Entered and Authorized by:   Corwin Levins MD   Signed by:   Corwin Levins MD on 07/20/2009   Method used:   Print then Give to Patient   RxID:   [id number]

## 2010-04-08 NOTE — Assessment & Plan Note (Signed)
Summary: card eval/high pulse rate/mt   Visit Type:  Follow-up Primary Provider:  Corwin Levins MD  CC:  Abnormal ECG and Atrial Fibrillation.  History of Present Illness: The patient presents for followup after recent hospitalization. He was admitted with acute cholecystitis. We saw him preoperatively because of risk factors and inferolateral T-wave inversions. At that time Dr. Graciela Husbands did not suggest further preoperative testing as his EKG was chronically abnormal. He had laparoscopic cholecystectomy and did well from a cardiovascular standpoint. There was an episode of nonsustained atrial fibrillation postoperatively. Since going home the patient has done very well. He denies any chest pressure, neck or arm discomfort. He never feels any palpitations and has had no presyncope or syncope. He has had no shortness of breath, PND or orthopnea. He has been dieting and lost weight. He said no fevers or chills. He said no abdominal discomfort or GI complaints.  Of note I did review the hospital records. He had an echocardiogram which demonstrated well-preserved left ventricular function but evidence of moderate LVH without significant valvular abnormalities.  Current Medications (verified): 1)  Kombiglyze Xr 5-500 Mg Xr24h-Tab (Saxagliptin-Metformin) .Marland Kitchen.. 1po Once Daily 2)  Crestor 20 Mg  Tabs (Rosuvastatin Calcium) .Marland Kitchen.. 1po Once Daily 3)  Indocin Sr 75 Mg Cpcr (Indomethacin) .Marland Kitchen.. 1 By Mouth Two Times A Day As Needed 4)  Lisinopril-Hydrochlorothiazide 20-12.5 Mg Tabs (Lisinopril-Hydrochlorothiazide) .... Hold 5)  Zetia 10 Mg Tabs (Ezetimibe) .... Hold 6)  Allopurinol 100 Mg Tabs (Allopurinol) .Marland Kitchen.. 1po Once Daily 7)  Onetouch Test  Strp (Glucose Blood) .... Use Asd 1 Once Daily 8)  Augmentin 875-125 Mg Tabs (Amoxicillin-Pot Clavulanate) .Marland Kitchen.. 1 By Mouth Three Times A Day 9)  Hydrocodone-Acetaminophen 5-500 Mg Tabs (Hydrocodone-Acetaminophen) .... As Needed 10)  Metoprolol Tartrate 50 Mg Tabs (Metoprolol  Tartrate) .Marland Kitchen.. 1 By Mouth Two Times A Day 11)  Protonix 40 Mg Tbec (Pantoprazole Sodium) .Marland Kitchen.. 1 By Mouth Daily  Allergies (verified): 1)  ! * Bee Stings  Past History:  Past Medical History: Hypertension Gout Obesity Hyperlipidemia Diabetes mellitus, type II Paroxysmal atrial fibrillation (postoperatively)  Past Surgical History: Laparoscopic cholecystectomy  Review of Systems       As stated in the HPI and negative for all other systems.   Vital Signs:  Patient profile:   59 year old male Height:      75 inches Weight:      319 pounds BMI:     40.02 Pulse rate:   54 / minute Resp:     16 per minute BP sitting:   138 / 90  (right arm)  Vitals Entered By: Marrion Coy, CNA (March 09, 2010 3:14 PM)   Impression & Recommendations:  Problem # 1:  ATRIAL FIBRILLATION (ICD-427.31) The patient had one episode of fibrillation postoperatively. This was self-limited and there have been no other documented episodes. This was also in the context of postoperative situation. He has minimal risk for thromboembolism. At this point, in the absence of further atrial fibrillation documented I would not suggest further testing or chronic Coumadin. I discussed this with the patient at length. Orders: EKG w/ Interpretation (93000) Treadmill (Treadmill)  Problem # 2:  PREVENTIVE HEALTH CARE (ICD-V70.0) The patient has significant cardiovascular risk factors and a baseline abnormal EKG. Screening with an exercise treadmill test as indicated. While I do not think there is a high pretest probability for obstructive coronary disease I do think this needs to be excluded, he needs to be risk stratified and most  importantly he needs a prescription for exercise.  Problem # 3:  DIABETES MELLITUS, TYPE II (ICD-250.00) I reviewed with him his hemoglobin A1c from November which was 7.5. Hopefully his sugar control will improve with diet and exercise.  Problem # 4:  HYPERLIPIDEMIA (ICD-272.4) I  reviewed with him his lipid profile from November. His HDL was 37 and I would like that to be 40. We discussed that this can go up with exercise. At this point I would not change his medical regimen.  Problem # 5:  OBESITY, HX OF (ICD-V13.8) I am delighted he is losing weight and we discussed strategies for diet and exercise. We will explore this further at the time of his treadmill.  Patient Instructions: 1)  Your physician recommends that you schedule a follow-up appointment at the time of your treadmill 2)  Your physician recommends that you continue on your current medications as directed. Please refer to the Current Medication list given to you today. 3)  Your physician has requested that you have an exercise tolerance test.  For further information please visit [email or web address]  Please also follow instruction sheet, as given.

## 2010-04-08 NOTE — Letter (Signed)
Summary: [facility]   Imported By: Lennie Odor 03/31/2010 13:59:53  _____________________________________________________________________  External Attachment:    Type:   Image     Comment:   External Document

## 2010-04-12 ENCOUNTER — Encounter (INDEPENDENT_AMBULATORY_CARE_PROVIDER_SITE_OTHER): Payer: 59

## 2010-04-12 ENCOUNTER — Encounter: Payer: Self-pay | Admitting: Cardiology

## 2010-04-12 ENCOUNTER — Encounter (INDEPENDENT_AMBULATORY_CARE_PROVIDER_SITE_OTHER): Payer: 59 | Admitting: Cardiology

## 2010-04-12 DIAGNOSIS — R0989 Other specified symptoms and signs involving the circulatory and respiratory systems: Secondary | ICD-10-CM

## 2010-04-12 DIAGNOSIS — R0602 Shortness of breath: Secondary | ICD-10-CM | POA: Insufficient documentation

## 2010-04-13 ENCOUNTER — Encounter: Payer: Self-pay | Admitting: Cardiology

## 2010-04-26 ENCOUNTER — Encounter: Payer: Self-pay | Admitting: Cardiology

## 2010-04-28 ENCOUNTER — Telehealth (INDEPENDENT_AMBULATORY_CARE_PROVIDER_SITE_OTHER): Payer: Self-pay | Admitting: *Deleted

## 2010-04-29 ENCOUNTER — Encounter: Payer: Self-pay | Admitting: Internal Medicine

## 2010-05-03 ENCOUNTER — Encounter: Payer: Self-pay | Admitting: Internal Medicine

## 2010-05-04 NOTE — Letter (Signed)
Summary: [facility] - Office Note  [facility] - Office Note   Imported By: Marylou Mccoy 04/28/2010 13:51:41  _____________________________________________________________________  External Attachment:    Type:   Image     Comment:   External Document

## 2010-05-07 ENCOUNTER — Telehealth: Payer: Self-pay | Admitting: Cardiology

## 2010-05-13 ENCOUNTER — Telehealth: Payer: Self-pay | Admitting: Internal Medicine

## 2010-05-13 NOTE — Progress Notes (Signed)
Summary: Monitor results  Phone Note Outgoing Call   Call placed by: Charolotte Capuchin, RN,  May 07, 2010 11:55 AM Call placed to: Patient Details for Reason: monitor results Summary of Call: Per Dr Verdean Murin's reading of monitor results are NSR, sinus arrhythmia, atrial tachycardia and PAC's.  Left message at pts home number to call back for results Initial call taken by: Charolotte Capuchin, RN,  May 07, 2010 11:57 AM  Follow-up for Phone Call        Pt returning call Judie Grieve  May 07, 2010 1:39 PM  pt aware of monitor results Follow-up by: Charolotte Capuchin, RN,  May 07, 2010 5:24 PM

## 2010-05-13 NOTE — Medication Information (Signed)
Summary: Test strips denied/[employer]  Test strips denied/[employer]   Imported By: Sherian Rein 05/05/2010 12:41:53  _____________________________________________________________________  External Attachment:    Type:   Image     Comment:   External Document

## 2010-05-14 ENCOUNTER — Telehealth: Payer: Self-pay | Admitting: Internal Medicine

## 2010-05-18 LAB — GLUCOSE, CAPILLARY
Glucose-Capillary: 138 mg/dL — ABNORMAL HIGH (ref 70–99)
Glucose-Capillary: 147 mg/dL — ABNORMAL HIGH (ref 70–99)
Glucose-Capillary: 160 mg/dL — ABNORMAL HIGH (ref 70–99)
Glucose-Capillary: 164 mg/dL — ABNORMAL HIGH (ref 70–99)
Glucose-Capillary: 164 mg/dL — ABNORMAL HIGH (ref 70–99)
Glucose-Capillary: 167 mg/dL — ABNORMAL HIGH (ref 70–99)
Glucose-Capillary: 168 mg/dL — ABNORMAL HIGH (ref 70–99)
Glucose-Capillary: 171 mg/dL — ABNORMAL HIGH (ref 70–99)
Glucose-Capillary: 190 mg/dL — ABNORMAL HIGH (ref 70–99)

## 2010-05-18 LAB — COMPREHENSIVE METABOLIC PANEL
ALT: 43 U/L (ref 0–53)
AST: 13 U/L (ref 0–37)
AST: 33 U/L (ref 0–37)
AST: 81 U/L — ABNORMAL HIGH (ref 0–37)
Albumin: 2.4 g/dL — ABNORMAL LOW (ref 3.5–5.2)
Alkaline Phosphatase: 51 U/L (ref 39–117)
Alkaline Phosphatase: 53 U/L (ref 39–117)
Alkaline Phosphatase: 79 U/L (ref 39–117)
BUN: 13 mg/dL (ref 6–23)
CO2: 25 mEq/L (ref 19–32)
CO2: 28 mEq/L (ref 19–32)
CO2: 28 mEq/L (ref 19–32)
Calcium: 8.2 mg/dL — ABNORMAL LOW (ref 8.4–10.5)
Chloride: 100 mEq/L (ref 96–112)
Chloride: 100 mEq/L (ref 96–112)
Chloride: 101 mEq/L (ref 96–112)
Chloride: 98 mEq/L (ref 96–112)
Creatinine, Ser: 1.34 mg/dL (ref 0.4–1.5)
Creatinine, Ser: 1.36 mg/dL (ref 0.4–1.5)
GFR calc Af Amer: 60 mL/min — ABNORMAL LOW (ref >60)
GFR calc Af Amer: >60 mL/min (ref >60)
GFR calc Af Amer: >60 mL/min (ref >60)
GFR calc non Af Amer: 49 mL/min — ABNORMAL LOW (ref >60)
GFR calc non Af Amer: 53 mL/min — ABNORMAL LOW (ref >60)
GFR calc non Af Amer: 54 mL/min — ABNORMAL LOW (ref >60)
Glucose, Bld: 163 mg/dL — ABNORMAL HIGH (ref 70–99)
Glucose, Bld: 211 mg/dL — ABNORMAL HIGH (ref 70–99)
Potassium: 3.4 mEq/L — ABNORMAL LOW (ref 3.5–5.1)
Potassium: 3.6 mEq/L (ref 3.5–5.1)
Potassium: 3.8 mEq/L (ref 3.5–5.1)
Sodium: 137 mEq/L (ref 135–145)
Total Bilirubin: 0.9 mg/dL (ref 0.3–1.2)
Total Bilirubin: 1 mg/dL (ref 0.3–1.2)
Total Bilirubin: 1.4 mg/dL — ABNORMAL HIGH (ref 0.3–1.2)
Total Bilirubin: 1.6 mg/dL — ABNORMAL HIGH (ref 0.3–1.2)
Total Protein: 6.3 g/dL (ref 6.0–8.3)

## 2010-05-18 LAB — DIFFERENTIAL
Basophils Absolute: 0 10*3/uL (ref 0.0–0.1)
Lymphocytes Relative: 9 % — ABNORMAL LOW (ref 12–46)
Neutro Abs: 12.6 10*3/uL — ABNORMAL HIGH (ref 1.7–7.7)
Neutrophils Relative %: 85 % — ABNORMAL HIGH (ref 43–77)

## 2010-05-18 LAB — BASIC METABOLIC PANEL
BUN: 10 mg/dL (ref 6–23)
BUN: 16 mg/dL (ref 6–23)
CO2: 27 mEq/L (ref 19–32)
Chloride: 102 mEq/L (ref 96–112)
Creatinine, Ser: 1.37 mg/dL (ref 0.4–1.5)
GFR calc Af Amer: >60 mL/min (ref >60)
GFR calc non Af Amer: 53 mL/min — ABNORMAL LOW (ref >60)
Glucose, Bld: 124 mg/dL — ABNORMAL HIGH (ref 70–99)
Potassium: 3.5 mEq/L (ref 3.5–5.1)
Potassium: 3.5 mEq/L (ref 3.5–5.1)
Sodium: 138 mEq/L (ref 135–145)

## 2010-05-18 LAB — CBC
HCT: 34.4 % — ABNORMAL LOW (ref 39.0–52.0)
HCT: 37 % — ABNORMAL LOW (ref 39.0–52.0)
HCT: 37.7 % — ABNORMAL LOW (ref 39.0–52.0)
Hemoglobin: 11.3 g/dL — ABNORMAL LOW (ref 13.0–17.0)
Hemoglobin: 12.1 g/dL — ABNORMAL LOW (ref 13.0–17.0)
Hemoglobin: 13.2 g/dL (ref 13.0–17.0)
Hemoglobin: 15.3 g/dL (ref 13.0–17.0)
MCH: 30.9 pg (ref 26.0–34.0)
MCH: 31.1 pg (ref 26.0–34.0)
MCH: 32.6 pg (ref 26.0–34.0)
MCHC: 32.7 g/dL (ref 30.0–36.0)
MCHC: 32.8 g/dL (ref 30.0–36.0)
MCHC: 33.2 g/dL (ref 30.0–36.0)
MCHC: 33.5 g/dL (ref 30.0–36.0)
MCV: 92.6 fL (ref 78.0–100.0)
MCV: 94.8 fL (ref 78.0–100.0)
MCV: 95 fL (ref 78.0–100.0)
MCV: 95.6 fL (ref 78.0–100.0)
Platelets: 185 10*3/uL (ref 150–400)
Platelets: 191 10*3/uL (ref 150–400)
Platelets: 224 10*3/uL (ref 150–400)
RBC: 3.88 MIL/uL — ABNORMAL LOW (ref 4.22–5.81)
RBC: 4.27 MIL/uL (ref 4.22–5.81)
RBC: 4.7 MIL/uL (ref 4.22–5.81)
RDW: 13 % (ref 11.5–15.5)
RDW: 13 % (ref 11.5–15.5)
RDW: 13.1 % (ref 11.5–15.5)
WBC: 9.2 10*3/uL (ref 4.0–10.5)

## 2010-05-18 LAB — MAGNESIUM: Magnesium: 1.6 mg/dL (ref 1.5–2.5)

## 2010-05-18 LAB — HEMOGLOBIN A1C: Hgb A1c MFr Bld: 7.6 % — ABNORMAL HIGH (ref <5.7)

## 2010-05-18 LAB — LIPID PANEL
HDL: 30 mg/dL — ABNORMAL LOW (ref >39)
LDL Cholesterol: 42 mg/dL (ref 0–99)
Triglycerides: 101 mg/dL (ref <150)
VLDL: 20 mg/dL (ref 0–40)

## 2010-05-18 LAB — CARDIAC PANEL(CRET KIN+CKTOT+MB+TROPI)
CK, MB: 0.7 ng/mL (ref 0.3–4.0)
CK, MB: 1 ng/mL (ref 0.3–4.0)
Relative Index: INVALID (ref 0.0–2.5)
Relative Index: INVALID (ref 0.0–2.5)
Relative Index: INVALID (ref 0.0–2.5)
Total CK: 43 U/L (ref 7–232)
Troponin I: 0.02 ng/mL (ref 0.00–0.06)
Troponin I: 0.03 ng/mL (ref 0.00–0.06)
Troponin I: 0.03 ng/mL (ref 0.00–0.06)

## 2010-05-18 LAB — URINE MICROSCOPIC-ADD ON

## 2010-05-18 LAB — LIPASE, BLOOD: Lipase: 21 U/L (ref 11–59)

## 2010-05-18 LAB — URINALYSIS, ROUTINE W REFLEX MICROSCOPIC
Ketones, ur: 40 mg/dL — AB
Leukocytes, UA: NEGATIVE
Nitrite: NEGATIVE
pH: 5 (ref 5.0–8.0)

## 2010-05-18 LAB — POCT CARDIAC MARKERS
CKMB, poc: <1 ng/mL — ABNORMAL LOW (ref 1.0–8.0)
Myoglobin, poc: 146 ng/mL (ref 12–200)

## 2010-05-18 LAB — CK TOTAL AND CKMB (NOT AT ARMC): CK, MB: 0.7 ng/mL (ref 0.3–4.0)

## 2010-05-18 LAB — AMYLASE: Amylase: 27 U/L (ref 0–105)

## 2010-05-18 NOTE — Procedures (Signed)
Summary: SUMMARY REPORT  SUMMARY REPORT   Imported By: Mirna Mires 05/11/2010 13:17:59  _____________________________________________________________________  External Attachment:    Type:   Image     Comment:   External Document

## 2010-05-18 NOTE — Progress Notes (Signed)
Summary: PA-one touch strips  Phone Note From Pharmacy   Summary of Call: PA-one touch ultra strips, awaiting form from [facility] @ [phone]. Dagoberto Reef  April 28, 2010 4:53 PM Form to Dr Jonny Ruiz to complete. Dagoberto Reef  April 29, 2010 8:32 AM Faxed to [facility] @ [phone], awaiting approval. Dagoberto Reef  April 30, 2010 8:27 AM Insurance denied  Initial call taken by: Dagoberto Reef,  May 11, 2010 3:57 PM

## 2010-05-18 NOTE — Medication Information (Signed)
Summary: Prior Auth/[facility]  Prior Auth/[facility]   Imported By: Lester Aline 05/13/2010 11:16:02  _____________________________________________________________________  External Attachment:    Type:   Image     Comment:   External Document

## 2010-05-18 NOTE — Progress Notes (Signed)
  Phone Note Refill Request Message from:  Pharmacy on May 14, 2010 10:50 AM  Refills Requested: Medication #1:  ZETIA 10 MG TABS HOLD   Dosage confirmed as above?Dosage Confirmed   Notes: [facility]  Medication #2:  LISINOPRIL-HYDROCHLOROTHIAZIDE 20-12.5 MG TABS HOLD   Dosage confirmed as above?Dosage Confirmed   Notes: [facility] Initial call taken by: Zella Ball Ewing CMA (AAMA),  May 14, 2010 10:50 AM    Prescriptions: ZETIA 10 MG TABS (EZETIMIBE) HOLD  #90 x 2   Entered by:   Scharlene Gloss CMA (AAMA)   Authorized by:   Corwin Levins MD   Signed by:   Scharlene Gloss CMA (AAMA) on 05/14/2010   Method used:   Faxed to ...       [facility]* (retail)             ,          Ph: [phone]       Fax: [phone]   RxID:   [id number] LISINOPRIL-HYDROCHLOROTHIAZIDE 20-12.5 MG TABS (LISINOPRIL-HYDROCHLOROTHIAZIDE) HOLD  #90 x 2   Entered by:   Scharlene Gloss CMA (AAMA)   Authorized by:   Corwin Levins MD   Signed by:   Scharlene Gloss CMA (AAMA) on 05/14/2010   Method used:   Faxed to ...       [facility]* (retail)             ,          Ph: [phone]       Fax: [phone]   RxID:   [id number]

## 2010-05-18 NOTE — Procedures (Signed)
Summary: summary report  summary report   Imported By: Mirna Mires 05/10/2010 16:29:38  _____________________________________________________________________  External Attachment:    Type:   Image     Comment:   External Document

## 2010-05-18 NOTE — Progress Notes (Signed)
  Phone Note Refill Request Message from:  Fax from Pharmacy on May 13, 2010 8:11 AM  Refills Requested: Medication #1:  LISINOPRIL-HYDROCHLOROTHIAZIDE 20-12.5 MG TABS HOLD   Dosage confirmed as above?Dosage Confirmed   Notes: [facility]  Medication #2:  ZETIA 10 MG TABS HOLD   Dosage confirmed as above?Dosage Confirmed   Notes: [facility] Initial call taken by: Robin Ewing CMA (AAMA),  May 13, 2010 8:12 AM    Prescriptions: ALLOPURINOL 100 MG TABS (ALLOPURINOL) 1po once daily  #90 x 2   Entered by:   Zella Ball Ewing CMA (AAMA)   Authorized by:   Corwin Levins MD   Signed by:   Scharlene Gloss CMA (AAMA) on 05/13/2010   Method used:   Faxed to ...       [facility]* (retail)             ,          Ph: [phone]       Fax: [phone]   RxID:   [id number] ONETOUCH TEST  STRP (GLUCOSE BLOOD) use asd 1 once daily  #100 x 2   Entered by:   Zella Ball Ewing CMA (AAMA)   Authorized by:   Corwin Levins MD   Signed by:   Scharlene Gloss CMA (AAMA) on 05/13/2010   Method used:   Faxed to ...       [facility]* (retail)             ,          Ph: [phone]       Fax: [phone]   RxID:   [id number] KOMBIGLYZE XR 5-500 MG XR24H-TAB (SAXAGLIPTIN-METFORMIN) 1po once daily  #90 x 2   Entered by:   Zella Ball Ewing CMA (AAMA)   Authorized by:   Corwin Levins MD   Signed by:   Scharlene Gloss CMA (AAMA) on 05/13/2010   Method used:   Faxed to ...       [facility]* (retail)             ,          Ph: [phone]       Fax: [phone]   RxID:   [id number] LISINOPRIL-HYDROCHLOROTHIAZIDE 20-12.5 MG TABS (LISINOPRIL-HYDROCHLOROTHIAZIDE) HOLD  #90 x 2   Entered by:   Scharlene Gloss CMA (AAMA)   Authorized by:   Corwin Levins MD   Signed by:   Scharlene Gloss CMA (AAMA) on 05/13/2010   Method used:   Faxed to ...       [facility]* (retail)             ,          Ph: [phone]       Fax: [phone]   RxID:   [id number] ZETIA 10 MG TABS (EZETIMIBE) HOLD  #90 x 2   Entered by:    Scharlene Gloss CMA (AAMA)   Authorized by:   Corwin Levins MD   Signed by:   Scharlene Gloss CMA (AAMA) on 05/13/2010   Method used:   Faxed to ...       [facility]* (retail)             ,          Ph: [phone]       Fax: [phone]   RxID:   [id number]

## 2010-05-25 NOTE — Procedures (Signed)
Summary: Summary Report  Summary Report   Imported By: Erle Crocker 05/21/2010 13:27:48  _____________________________________________________________________  External Attachment:    Type:   Image     Comment:   External Document

## 2010-06-17 LAB — GLUCOSE, CAPILLARY
Glucose-Capillary: 115 mg/dL — ABNORMAL HIGH (ref 70–99)
Glucose-Capillary: 136 mg/dL — ABNORMAL HIGH (ref 70–99)

## 2010-07-13 ENCOUNTER — Other Ambulatory Visit: Payer: Self-pay

## 2010-07-13 ENCOUNTER — Other Ambulatory Visit: Payer: Self-pay | Admitting: Internal Medicine

## 2010-07-18 ENCOUNTER — Encounter: Payer: Self-pay | Admitting: Internal Medicine

## 2010-07-18 DIAGNOSIS — Z0000 Encounter for general adult medical examination without abnormal findings: Secondary | ICD-10-CM | POA: Insufficient documentation

## 2010-07-19 ENCOUNTER — Encounter: Payer: Self-pay | Admitting: Internal Medicine

## 2010-07-20 ENCOUNTER — Other Ambulatory Visit (INDEPENDENT_AMBULATORY_CARE_PROVIDER_SITE_OTHER): Payer: 59

## 2010-07-20 ENCOUNTER — Ambulatory Visit: Payer: Self-pay | Admitting: Internal Medicine

## 2010-07-20 DIAGNOSIS — IMO0001 Reserved for inherently not codable concepts without codable children: Secondary | ICD-10-CM

## 2010-07-20 LAB — LIPID PANEL
Cholesterol: 121 mg/dL (ref 0–200)
VLDL: 20 mg/dL (ref 0.0–40.0)

## 2010-07-20 LAB — BASIC METABOLIC PANEL
BUN: 15 mg/dL (ref 6–23)
Calcium: 8.9 mg/dL (ref 8.4–10.5)
GFR: 81.49 mL/min (ref >60.00)
Potassium: 4.3 mEq/L (ref 3.5–5.1)

## 2010-07-20 LAB — HEMOGLOBIN A1C: Hgb A1c MFr Bld: 7.2 % — ABNORMAL HIGH (ref 4.6–6.5)

## 2010-07-21 ENCOUNTER — Ambulatory Visit (INDEPENDENT_AMBULATORY_CARE_PROVIDER_SITE_OTHER): Payer: 59 | Admitting: Internal Medicine

## 2010-07-21 ENCOUNTER — Encounter: Payer: Self-pay | Admitting: Internal Medicine

## 2010-07-21 DIAGNOSIS — I10 Essential (primary) hypertension: Secondary | ICD-10-CM

## 2010-07-21 DIAGNOSIS — E785 Hyperlipidemia, unspecified: Secondary | ICD-10-CM

## 2010-07-21 DIAGNOSIS — Z0000 Encounter for general adult medical examination without abnormal findings: Secondary | ICD-10-CM

## 2010-07-21 DIAGNOSIS — M109 Gout, unspecified: Secondary | ICD-10-CM

## 2010-07-21 DIAGNOSIS — E119 Type 2 diabetes mellitus without complications: Secondary | ICD-10-CM

## 2010-07-21 MED ORDER — LISINOPRIL-HYDROCHLOROTHIAZIDE 20-12.5 MG PO TABS: 1.0000 | ORAL_TABLET | Freq: Every day | ORAL | Status: DC

## 2010-07-21 MED ORDER — SAXAGLIPTIN-METFORMIN ER 5-1000 MG PO TB24: 1.0000 | ORAL_TABLET | Freq: Every day | ORAL | Status: DC

## 2010-07-21 MED ORDER — ROSUVASTATIN CALCIUM 20 MG PO TABS: 20.0000 mg | ORAL_TABLET | Freq: Every day | ORAL | Status: DC

## 2010-07-21 MED ORDER — EZETIMIBE 10 MG PO TABS: 10.0000 mg | ORAL_TABLET | Freq: Every day | ORAL | Status: DC

## 2010-07-21 MED ORDER — ALLOPURINOL 100 MG PO TABS: 100.0000 mg | ORAL_TABLET | Freq: Every day | ORAL | Status: DC

## 2010-07-21 MED ORDER — INDOMETHACIN ER 75 MG PO CPCR: 75.0000 mg | ORAL_CAPSULE | Freq: Two times a day (BID) | ORAL | Status: DC | PRN

## 2010-07-21 NOTE — Progress Notes (Signed)
Subjective:    Patient ID: Juan [name], male    DOB: 1950-10-11, 60 y.o.   MRN: [id number]  HPI    Here to f/u; overall doing ok,  Pt denies chest pain, increased sob or doe, wheezing, orthopnea, PND, increased LE swelling, palpitations, dizziness or syncope.  Pt denies new neurological symptoms such as new headache, or facial or extremity weakness or numbness   Pt denies polydipsia, polyuria, or low sugar symptoms such as weakness or confusion improved with po intake.  Pt states overall good compliance with meds, trying to follow lower cholesterol, diabetic diet, wt overall down from 338 to current 311 with better diet, but little exercise however. Past Medical History  Diagnosis Date  . Hypertension   . Gout   . Obesity   . Hyperlipidemia   . Diabetes mellitus     type II  . Paroxysmal atrial fibrillation     postoperatively  . Atrial fibrillation 03/09/2010  . DIABETES MELLITUS, TYPE II 07/27/2007  . GOUT 12/01/2006  . HYPERLIPIDEMIA 01/19/2007  . HYPERTENSION 12/01/2006  . 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 OBESITY, HX OF 12/01/2006   Past Surgical History  Procedure Date  . Cholecystectomy     Laparoscopic    reports that he has been smoking Pipe.  He does not have any smokeless tobacco history on file. He reports that he uses illicit drugs. His alcohol history not on file. family history includes Coronary artery disease in his father and Heart attack in his father. No Known Allergies Current Outpatient Prescriptions on File Prior to Visit  Medication Sig Dispense Refill  . allopurinol (ZYLOPRIM) 100 MG tablet Take 100 mg by mouth daily.        Marland Kitchen ezetimibe (ZETIA) 10 MG tablet Take 10 mg by mouth. HOLD       . glucose blood (ONE TOUCH TEST STRIPS) test strip 1 each by Other route. Use as directed once daily       . indomethacin (INDOCIN SR) 75 MG CR capsule Take 75 mg by mouth 2 (two) times daily as needed.        Marland Kitchen lisinopril-hydrochlorothiazide (PRINZIDE,ZESTORETIC)  20-12.5 MG per tablet Take 1 tablet by mouth daily.        . rosuvastatin (CRESTOR) 20 MG tablet Take 20 mg by mouth daily.        . Saxagliptin-Metformin (KOMBIGLYZE XR) 5-500 MG TB24 Take by mouth daily.        Marland Kitchen amoxicillin-clavulanate (AUGMENTIN) 875-125 MG per tablet Take 1 tablet by mouth 3 (three) times daily.        . hydrocodone-acetaminophen (LORCET-HD) 5-500 MG per capsule Take 1 capsule by mouth daily as needed.        . metoprolol (LOPRESSOR) 50 MG tablet Take 50 mg by mouth 2 (two) times daily.        . pantoprazole (PROTONIX) 40 MG tablet Take 40 mg by mouth daily.         Review of Systems All otherwise neg per pt     Objective:   Physical Exam BP 130/82  Pulse 61  Temp(Src) 97.9 F (36.6 C) (Oral)  Ht 6\' 3"  (1.905 m)  Wt 311 lb 4 oz (141.182 kg)  BMI 38.90 kg/m2  SpO2 97% Physical Exam  VS noted Constitutional: Pt appears well-developed and well-nourished.  HENT: Head: Normocephalic.  Right Ear: External ear normal.  Left Ear: External ear normal.  Eyes: Conjunctivae and EOM are normal. Pupils are equal, round, and reactive to  light.  Neck: Normal range of motion. Neck supple.  Cardiovascular: Normal rate and regular rhythm.   Pulmonary/Chest: Effort normal and breath sounds normal.  Abd:  Soft, NT, non-distended, + BS Neurological: Pt is alert. No cranial nerve deficit.  Skin: Skin is warm. No erythema.  Psychiatric: Pt behavior is normal. Thought content normal.  Does not appear depressed or nervous;  No active arthritis today       Assessment & Plan:

## 2010-07-21 NOTE — Assessment & Plan Note (Signed)
stable overall by hx and exam, most recent lab reviewed with pt, and pt to continue medical treatment as before  To check uric acid with next labs, cont allopurinol as is

## 2010-07-21 NOTE — Assessment & Plan Note (Signed)
Fairly well controlled but a1c still over 7 - will increase the kombiglyze, and ask pt to cont efforts at wt loss, diet  Lab Results  Component Value Date   HGBA1C 7.2* 07/20/2010

## 2010-07-21 NOTE — Assessment & Plan Note (Signed)
Lab Results  Component Value Date   WBC 6.4 02/15/2010   HGB 11.3* 02/15/2010   HCT 34.4* 02/15/2010   PLT 223 02/15/2010   CHOL 121 07/20/2010   TRIG 100.0 07/20/2010   HDL 36.20* 07/20/2010   ALT 37 02/14/2010   AST 33 02/14/2010   NA 139 07/20/2010   K 4.3 07/20/2010   CL 106 07/20/2010   CREATININE 1.0 07/20/2010   BUN 15 07/20/2010   CO2 27 07/20/2010   TSH 2.694 02/13/2010   PSA 0.87 01/13/2010   HGBA1C 7.2* 07/20/2010   MICROALBUR 3.3* 01/13/2010   stable overall by hx and exam, most recent lab reviewed with pt, and pt to continue medical treatment as before

## 2010-07-21 NOTE — Patient Instructions (Signed)
Continue all other medications as before, except increase the Kombiglyze to the 07/998 mg per day All medications were sent to [facility] for refill today Please return in 6 mo with Lab testing done 3-5 days before

## 2010-07-21 NOTE — Assessment & Plan Note (Signed)
stable overall by hx and exam, most recent lab reviewed with pt, and pt to continue medical treatment as before  Lab Results  Component Value Date   LDLCALC 65 07/20/2010

## 2011-01-10 ENCOUNTER — Telehealth: Payer: Self-pay

## 2011-01-10 DIAGNOSIS — Z0000 Encounter for general adult medical examination without abnormal findings: Secondary | ICD-10-CM

## 2011-01-10 DIAGNOSIS — Z1289 Encounter for screening for malignant neoplasm of other sites: Secondary | ICD-10-CM

## 2011-01-10 NOTE — Telephone Encounter (Signed)
Put order in for physical labs. 

## 2011-01-19 ENCOUNTER — Ambulatory Visit: Payer: 59 | Admitting: Internal Medicine

## 2011-02-21 ENCOUNTER — Other Ambulatory Visit: Payer: Self-pay | Admitting: Internal Medicine

## 2011-02-21 ENCOUNTER — Other Ambulatory Visit (INDEPENDENT_AMBULATORY_CARE_PROVIDER_SITE_OTHER): Payer: 59

## 2011-02-21 DIAGNOSIS — Z1289 Encounter for screening for malignant neoplasm of other sites: Secondary | ICD-10-CM

## 2011-02-21 DIAGNOSIS — Z0000 Encounter for general adult medical examination without abnormal findings: Secondary | ICD-10-CM

## 2011-02-21 LAB — BASIC METABOLIC PANEL
CO2: 26 mEq/L (ref 19–32)
Calcium: 8.9 mg/dL (ref 8.4–10.5)
Creatinine, Ser: 1.1 mg/dL (ref 0.4–1.5)
Glucose, Bld: 211 mg/dL — ABNORMAL HIGH (ref 70–99)

## 2011-02-21 LAB — CBC WITH DIFFERENTIAL/PLATELET
Basophils Absolute: 0 10*3/uL (ref 0.0–0.1)
HCT: 41.9 % (ref 39.0–52.0)
Hemoglobin: 14.5 g/dL (ref 13.0–17.0)
Lymphs Abs: 1.7 10*3/uL (ref 0.7–4.0)
MCV: 93.4 fl (ref 78.0–100.0)
Monocytes Absolute: 0.4 10*3/uL (ref 0.1–1.0)
Monocytes Relative: 5.4 % (ref 3.0–12.0)
Neutro Abs: 4.9 10*3/uL (ref 1.4–7.7)
RDW: 13.1 % (ref 11.5–14.6)

## 2011-02-21 LAB — PSA: PSA: 1.46 ng/mL (ref 0.10–4.00)

## 2011-02-21 LAB — LIPID PANEL
Cholesterol: 137 mg/dL (ref 0–200)
HDL: 36.7 mg/dL — ABNORMAL LOW (ref >39.00)
Total CHOL/HDL Ratio: 4
Triglycerides: 206 mg/dL — ABNORMAL HIGH (ref 0.0–149.0)
VLDL: 41.2 mg/dL — ABNORMAL HIGH (ref 0.0–40.0)

## 2011-02-21 LAB — URINALYSIS, ROUTINE W REFLEX MICROSCOPIC
Hgb urine dipstick: NEGATIVE
Leukocytes, UA: NEGATIVE
Nitrite: NEGATIVE
Urobilinogen, UA: 0.2 (ref 0.0–1.0)

## 2011-02-21 LAB — HEPATIC FUNCTION PANEL
Albumin: 3.6 g/dL (ref 3.5–5.2)
Bilirubin, Direct: 0.1 mg/dL (ref 0.0–0.3)
Total Protein: 6.6 g/dL (ref 6.0–8.3)

## 2011-02-25 ENCOUNTER — Ambulatory Visit (INDEPENDENT_AMBULATORY_CARE_PROVIDER_SITE_OTHER): Payer: 59 | Admitting: Internal Medicine

## 2011-02-25 VITALS — BP 138/92 | HR 74 | Temp 98.2°F | Wt 306.0 lb

## 2011-02-25 DIAGNOSIS — Z136 Encounter for screening for cardiovascular disorders: Secondary | ICD-10-CM

## 2011-02-25 DIAGNOSIS — R972 Elevated prostate specific antigen [PSA]: Secondary | ICD-10-CM

## 2011-02-25 DIAGNOSIS — Z23 Encounter for immunization: Secondary | ICD-10-CM

## 2011-02-25 DIAGNOSIS — Z0000 Encounter for general adult medical examination without abnormal findings: Secondary | ICD-10-CM

## 2011-02-25 DIAGNOSIS — E119 Type 2 diabetes mellitus without complications: Secondary | ICD-10-CM

## 2011-02-25 MED ORDER — PIOGLITAZONE HCL 15 MG PO TABS: 15.0000 mg | ORAL_TABLET | Freq: Every day | ORAL | Status: DC

## 2011-02-25 NOTE — Assessment & Plan Note (Addendum)

## 2011-02-25 NOTE — Patient Instructions (Addendum)
You had the flu shot today Take all new medications as prescribed  - the lower dose actos at 15 mg per day Continue all other medications as before You can use the OTC "ear wax removal kit" to help remove the left ear wax Please return in 6 mo with Lab testing done 3-5 days before

## 2011-02-25 NOTE — Assessment & Plan Note (Signed)
?   Somewhat suspicious for incrsaed psa velocity - for psa repeat in 6 mo

## 2011-02-26 ENCOUNTER — Encounter: Payer: Self-pay | Admitting: Internal Medicine

## 2011-02-26 NOTE — Assessment & Plan Note (Signed)
Uncontrolled, to add actos 15 qd,  to f/u any worsening symptoms or concerns

## 2011-02-26 NOTE — Progress Notes (Signed)
Subjective:    Patient ID: Juan [name], male    DOB: 31-Jan-1951, 60 y.o.   MRN: [id number]  HPI  Here for wellness and f/u;  Overall doing ok;  Pt denies CP, worsening SOB, DOE, wheezing, orthopnea, PND, worsening LE edema, palpitations, dizziness or syncope.  Pt denies neurological change such as new Headache, facial or extremity weakness.  Pt denies polydipsia, polyuria, or low sugar symptoms. Pt states overall good compliance with treatment and medications, good tolerability, and trying to follow lower cholesterol diet.  Pt denies worsening depressive symptoms, suicidal ideation or panic. No fever, wt loss, night sweats, loss of appetite, or other constitutional symptoms.  Pt states good ability with ADL's, low fall risk, home safety reviewed and adequate, no significant changes in hearing or vision, and occasionally active with exercise.  Due for flu shot. 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 Hypertension   . Gout   . Obesity   . Hyperlipidemia   . Diabetes mellitus     type II  . Paroxysmal atrial fibrillation     postoperatively  . Atrial fibrillation 03/09/2010  . DIABETES MELLITUS, TYPE II 07/27/2007  . GOUT 12/01/2006  . HYPERLIPIDEMIA 01/19/2007  . HYPERTENSION 12/01/2006  . 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 OBESITY, HX OF 12/01/2006   Past Surgical History  Procedure Date  . Cholecystectomy     Laparoscopic    reports that he has been smoking Pipe.  He does not have any smokeless tobacco history on file. He reports that he uses illicit drugs. His alcohol history not on file. family history includes Coronary artery disease in his father and Heart attack in his father. No Known Allergies Current Outpatient Prescriptions on File Prior to Visit  Medication Sig Dispense Refill  . allopurinol (ZYLOPRIM) 100 MG tablet Take 1 tablet (100 mg total) by mouth daily.  90 tablet  3  . ezetimibe (ZETIA) 10 MG tablet Take 1 tablet  (10 mg total) by mouth daily. HOLD  90 tablet  3  . glucose blood (ONE TOUCH TEST STRIPS) test strip 1 each by Other route. Use as directed once daily       . indomethacin (INDOCIN SR) 75 MG CR capsule Take 1 capsule (75 mg total) by mouth 2 (two) times daily as needed.  180 capsule  3  . lisinopril-hydrochlorothiazide (PRINZIDE,ZESTORETIC) 20-12.5 MG per tablet Take 1 tablet by mouth daily.  90 tablet  3  . rosuvastatin (CRESTOR) 20 MG tablet Take 1 tablet (20 mg total) by mouth daily.  90 tablet  3  . Saxagliptin-Metformin (KOMBIGLYZE XR) 07-998 MG TB24 Take 1 tablet by mouth daily.  90 tablet  3   Review of Systems Review of Systems  Constitutional: Negative for diaphoresis, activity change, appetite change and unexpected weight change.  HENT: Negative for hearing loss, ear pain, facial swelling, mouth sores and neck stiffness.   Eyes: Negative for pain, redness and visual disturbance.  Respiratory: Negative for shortness of breath and wheezing.   Cardiovascular: Negative for chest pain and palpitations.  Gastrointestinal: Negative for diarrhea, blood in stool, abdominal distention and rectal pain.  Genitourinary: Negative for hematuria, flank pain and decreased urine volume.  Musculoskeletal: Negative for myalgias and joint swelling.  Skin: Negative for color change and wound.  Neurological: Negative for syncope and numbness.  Hematological: Negative for adenopathy.  Psychiatric/Behavioral: Negative for hallucinations, self-injury, decreased concentration and agitation.  Objective:   Physical Exam BP 138/92  Pulse 74  Temp(Src) 98.2 F (36.8 C) (Oral)  Wt 306 lb (138.801 kg)  SpO2 96% Physical Exam  VS noted Constitutional: Pt is oriented to person, place, and time. Appears well-developed and well-nourished.  HENT:  Head: Normocephalic and atraumatic.  Right Ear: External ear normal.  Left Ear: External ear normal.  Nose: Nose normal.  Mouth/Throat: Oropharynx is clear  and moist.  Eyes: Conjunctivae and EOM are normal. Pupils are equal, round, and reactive to light.  Neck: Normal range of motion. Neck supple. No JVD present. No tracheal deviation present.  Cardiovascular: Normal rate, regular rhythm, normal heart sounds and intact distal pulses.   Pulmonary/Chest: Effort normal and breath sounds normal.  Abdominal: Soft. Bowel sounds are normal. There is no tenderness.  Musculoskeletal: Normal range of motion. Exhibits no edema.  Lymphadenopathy:  Has no cervical adenopathy.  Neurological: Pt is alert and oriented to person, place, and time. Pt has normal reflexes. No cranial nerve deficit.  Skin: Skin is warm and dry. No rash noted.  Psychiatric:  Has  normal mood and affect. Behavior is normal. 1+ nervous    Assessment & Plan:

## 2011-08-26 ENCOUNTER — Other Ambulatory Visit (INDEPENDENT_AMBULATORY_CARE_PROVIDER_SITE_OTHER): Payer: 59

## 2011-08-26 DIAGNOSIS — R972 Elevated prostate specific antigen [PSA]: Secondary | ICD-10-CM

## 2011-08-26 DIAGNOSIS — E119 Type 2 diabetes mellitus without complications: Secondary | ICD-10-CM

## 2011-08-26 LAB — PSA: PSA: 1.38 ng/mL (ref 0.10–4.00)

## 2011-08-26 LAB — LIPID PANEL
HDL: 40.5 mg/dL (ref >39.00)
Total CHOL/HDL Ratio: 6
Triglycerides: 282 mg/dL — ABNORMAL HIGH (ref 0.0–149.0)

## 2011-08-26 LAB — BASIC METABOLIC PANEL
Calcium: 9.3 mg/dL (ref 8.4–10.5)
GFR: 71.9 mL/min (ref >60.00)
Glucose, Bld: 254 mg/dL — ABNORMAL HIGH (ref 70–99)
Potassium: 4.3 mEq/L (ref 3.5–5.1)
Sodium: 138 mEq/L (ref 135–145)

## 2011-08-30 ENCOUNTER — Encounter: Payer: Self-pay | Admitting: Internal Medicine

## 2011-08-30 ENCOUNTER — Ambulatory Visit (INDEPENDENT_AMBULATORY_CARE_PROVIDER_SITE_OTHER): Payer: 59 | Admitting: Internal Medicine

## 2011-08-30 VITALS — BP 140/90 | HR 69 | Temp 98.0°F | Ht 74.0 in | Wt 283.5 lb

## 2011-08-30 DIAGNOSIS — R972 Elevated prostate specific antigen [PSA]: Secondary | ICD-10-CM

## 2011-08-30 DIAGNOSIS — E785 Hyperlipidemia, unspecified: Secondary | ICD-10-CM

## 2011-08-30 DIAGNOSIS — I10 Essential (primary) hypertension: Secondary | ICD-10-CM

## 2011-08-30 DIAGNOSIS — E119 Type 2 diabetes mellitus without complications: Secondary | ICD-10-CM

## 2011-08-30 DIAGNOSIS — Z0000 Encounter for general adult medical examination without abnormal findings: Secondary | ICD-10-CM

## 2011-08-30 MED ORDER — PIOGLITAZONE HCL 15 MG PO TABS: 15.0000 mg | ORAL_TABLET | Freq: Every day | ORAL | Status: DC

## 2011-08-30 MED ORDER — ROSUVASTATIN CALCIUM 20 MG PO TABS: 20.0000 mg | ORAL_TABLET | Freq: Every day | ORAL | Status: DC

## 2011-08-30 MED ORDER — ALLOPURINOL 100 MG PO TABS: 100.0000 mg | ORAL_TABLET | Freq: Every day | ORAL | Status: DC

## 2011-08-30 MED ORDER — GLUCOSE BLOOD VI STRP: ORAL_STRIP | Status: DC

## 2011-08-30 MED ORDER — SAXAGLIPTIN-METFORMIN ER 5-1000 MG PO TB24: 1.0000 | ORAL_TABLET | Freq: Every day | ORAL | Status: DC

## 2011-08-30 MED ORDER — ASPIRIN 81 MG PO TBEC: 81.0000 mg | DELAYED_RELEASE_TABLET | Freq: Every day | ORAL | Status: AC

## 2011-08-30 MED ORDER — LISINOPRIL-HYDROCHLOROTHIAZIDE 20-12.5 MG PO TABS: 1.0000 | ORAL_TABLET | Freq: Every day | ORAL | Status: DC

## 2011-08-30 MED ORDER — EZETIMIBE 10 MG PO TABS: 10.0000 mg | ORAL_TABLET | Freq: Every day | ORAL | Status: DC

## 2011-08-30 MED ORDER — INDOMETHACIN ER 75 MG PO CPCR: 75.0000 mg | ORAL_CAPSULE | Freq: Two times a day (BID) | ORAL | Status: DC | PRN

## 2011-08-30 NOTE — Assessment & Plan Note (Signed)
Uncontrolled, goal ldl < 70, Continue all other medications as before - to re-start crestor 20 Lab Results  Component Value Date   LDLCALC 65 07/20/2010  declines referral for DM education

## 2011-08-30 NOTE — Patient Instructions (Addendum)
Please re-start your medications Your refills were all sent today No further changes today Please continue your efforts at being more active, low cholesterol diet, and weight control. Please return in 6 mo with Lab testing done 3-5 days before

## 2011-08-30 NOTE — Assessment & Plan Note (Signed)
stable overall by hx and exam, most recent data reviewed with pt, and pt to continue medical treatment as before Lab Results  Component Value Date   PSA 1.38 08/26/2011   PSA 1.46 02/21/2011   PSA 0.87 01/13/2010

## 2011-08-30 NOTE — Progress Notes (Signed)
Subjective:    Patient ID: Juan [name], male    DOB: 1950/10/20, 61 y.o.   MRN: [id number]  HPI Here to f/u; overall doing ok,  Pt denies chest pain, increased sob or doe, wheezing, orthopnea, PND, increased LE swelling, palpitations, dizziness or syncope.  Pt denies new neurological symptoms such as new headache, or facial or extremity weakness or numbness   Pt denies polydipsia, polyuria, or low sugar symptoms such as weakness or confusion improved with po intake.  Pt states overall good compliance with meds, trying to follow lower cholesterol, diabetic diet, wt overall stable but little exercise however.  Unfortunately has been out of all his med for 6 wks, "had trouble getting them from the pharmacy." Wife has been ill. Denies worsening depressive symptoms, suicidal ideation, or panic Past Medical History  Diagnosis Date  . Hypertension   . Gout   . Obesity   . Hyperlipidemia   . Diabetes mellitus     type II  . Paroxysmal atrial fibrillation     postoperatively  . Atrial fibrillation 03/09/2010  . DIABETES MELLITUS, TYPE II 07/27/2007  . GOUT 12/01/2006  . HYPERLIPIDEMIA 01/19/2007  . HYPERTENSION 12/01/2006  . 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 OBESITY, HX OF 12/01/2006   Past Surgical History  Procedure Date  . Cholecystectomy     Laparoscopic    reports that he has been smoking Pipe.  He does not have any smokeless tobacco history on file. He reports that he uses illicit drugs. His alcohol history not on file. family history includes Coronary artery disease in his father and Heart attack in his father. No Known Allergies Current Outpatient Prescriptions on File Prior to Visit  Medication Sig Dispense Refill  . DISCONTD: allopurinol (ZYLOPRIM) 100 MG tablet Take 1 tablet (100 mg total) by mouth daily.  90 tablet  3  . DISCONTD: ezetimibe (ZETIA) 10 MG tablet Take 1 tablet (10 mg total) by mouth daily. HOLD  90 tablet  3  . DISCONTD: lisinopril-hydrochlorothiazide  (PRINZIDE,ZESTORETIC) 20-12.5 MG per tablet Take 1 tablet by mouth daily.  90 tablet  3  . DISCONTD: pioglitazone (ACTOS) 15 MG tablet Take 1 tablet (15 mg total) by mouth daily.  90 tablet  3  . DISCONTD: rosuvastatin (CRESTOR) 20 MG tablet Take 1 tablet (20 mg total) by mouth daily.  90 tablet  3  . DISCONTD: Saxagliptin-Metformin (KOMBIGLYZE XR) 07-998 MG TB24 Take 1 tablet by mouth daily.  90 tablet  3   Review of Systems Review of Systems  Constitutional: Negative for diaphoresis and unexpected weight change.  Eyes: Negative for photophobia and visual disturbance.  Respiratory: Negative for choking and stridor.   Gastrointestinal: Negative for vomiting and blood in stool.  Genitourinary: Negative for hematuria and decreased urine volume.  Skin: Negative for color change and wound.  Neurological: Negative for tremors and numbness.     Objective:   Physical Exam BP 140/90  Pulse 69  Temp 98 F (36.7 C) (Oral)  Ht 6\' 2"  (1.88 m)  Wt 283 lb 8 oz (128.595 kg)  BMI 36.40 kg/m2  SpO2 98% Physical Exam  VS noted Constitutional: Pt appears well-developed and well-nourished.  HENT: Head: Normocephalic.  Right Ear: External ear normal.  Left Ear: External ear normal.  Eyes: Conjunctivae and EOM are normal. Pupils are equal, round, and reactive to light.  Neck: Normal range of motion. Neck supple.  Cardiovascular: Normal rate and regular rhythm.   Pulmonary/Chest: Effort normal and breath sounds normal.  Neurological: Pt is alert. Not confused Skin: Skin is warm. No erythema.  Psychiatric: Pt behavior is normal. Thought content normal. 1+ nervous    Assessment & Plan:

## 2011-08-30 NOTE — Assessment & Plan Note (Signed)
Severe uncontrolled, out of meds for over a month, to re-start meds, reinforced importance of med compliance, Please continue  efforts at being more active, low cholesterol DM diet, and weight control, declines referral for DM education Lab Results  Component Value Date   HGBA1C 10.4* 08/26/2011

## 2011-08-30 NOTE — Assessment & Plan Note (Addendum)
stable overall by hx and exam, most recent data reviewed with pt, and pt to continue medical treatment as before  - to re-start meds BP Readings from Last 3 Encounters:  08/30/11 140/90  02/25/11 138/92  07/21/10 130/82

## 2012-02-15 ENCOUNTER — Other Ambulatory Visit (INDEPENDENT_AMBULATORY_CARE_PROVIDER_SITE_OTHER): Payer: 59

## 2012-02-15 DIAGNOSIS — E119 Type 2 diabetes mellitus without complications: Secondary | ICD-10-CM

## 2012-02-15 DIAGNOSIS — Z0000 Encounter for general adult medical examination without abnormal findings: Secondary | ICD-10-CM

## 2012-02-15 LAB — CBC WITH DIFFERENTIAL/PLATELET
Basophils Absolute: 0 10*3/uL (ref 0.0–0.1)
Eosinophils Absolute: 0.1 10*3/uL (ref 0.0–0.7)
HCT: 48.4 % (ref 39.0–52.0)
Hemoglobin: 16.8 g/dL (ref 13.0–17.0)
Lymphocytes Relative: 25.5 % (ref 12.0–46.0)
Lymphs Abs: 1.6 10*3/uL (ref 0.7–4.0)
MCHC: 34.6 g/dL (ref 30.0–36.0)
Monocytes Relative: 5.9 % (ref 3.0–12.0)
Neutro Abs: 4 10*3/uL (ref 1.4–7.7)
Platelets: 233 10*3/uL (ref 150.0–400.0)
RDW: 13 % (ref 11.5–14.6)

## 2012-02-15 LAB — URINALYSIS, ROUTINE W REFLEX MICROSCOPIC
Bilirubin Urine: NEGATIVE
Ketones, ur: NEGATIVE
Leukocytes, UA: NEGATIVE
Urine Glucose: >=1000
Urobilinogen, UA: 0.2 (ref 0.0–1.0)

## 2012-02-15 LAB — BASIC METABOLIC PANEL
BUN: 10 mg/dL (ref 6–23)
Chloride: 102 mEq/L (ref 96–112)
Creatinine, Ser: 1 mg/dL (ref 0.4–1.5)
GFR: 80.14 mL/min (ref >60.00)

## 2012-02-15 LAB — HEPATIC FUNCTION PANEL
AST: 53 U/L — ABNORMAL HIGH (ref 0–37)
Albumin: 3.7 g/dL (ref 3.5–5.2)
Alkaline Phosphatase: 87 U/L (ref 39–117)
Bilirubin, Direct: 0.2 mg/dL (ref 0.0–0.3)
Total Protein: 7.1 g/dL (ref 6.0–8.3)

## 2012-02-15 LAB — TSH: TSH: 2.46 u[IU]/mL (ref 0.35–5.50)

## 2012-02-15 LAB — HEMOGLOBIN A1C: Hgb A1c MFr Bld: 9.6 % — ABNORMAL HIGH (ref 4.6–6.5)

## 2012-02-15 LAB — LIPID PANEL
Cholesterol: 262 mg/dL — ABNORMAL HIGH (ref 0–200)
HDL: 39.9 mg/dL (ref >39.00)
VLDL: 41.6 mg/dL — ABNORMAL HIGH (ref 0.0–40.0)

## 2012-02-15 LAB — LDL CHOLESTEROL, DIRECT: Direct LDL: 204.5 mg/dL

## 2012-02-27 ENCOUNTER — Ambulatory Visit (INDEPENDENT_AMBULATORY_CARE_PROVIDER_SITE_OTHER): Payer: 59 | Admitting: Internal Medicine

## 2012-02-27 ENCOUNTER — Encounter: Payer: Self-pay | Admitting: Internal Medicine

## 2012-02-27 VITALS — BP 140/88 | HR 62 | Temp 98.3°F | Ht 74.0 in | Wt 293.2 lb

## 2012-02-27 DIAGNOSIS — E119 Type 2 diabetes mellitus without complications: Secondary | ICD-10-CM

## 2012-02-27 MED ORDER — ALLOPURINOL 100 MG PO TABS: 100.0000 mg | ORAL_TABLET | Freq: Every day | ORAL | Status: DC

## 2012-02-27 MED ORDER — EZETIMIBE 10 MG PO TABS: 10.0000 mg | ORAL_TABLET | Freq: Every day | ORAL | Status: DC

## 2012-02-27 MED ORDER — INDOMETHACIN ER 75 MG PO CPCR: 75.0000 mg | ORAL_CAPSULE | Freq: Two times a day (BID) | ORAL | Status: DC | PRN

## 2012-02-27 MED ORDER — PIOGLITAZONE HCL 15 MG PO TABS: 15.0000 mg | ORAL_TABLET | Freq: Every day | ORAL | Status: DC

## 2012-02-27 MED ORDER — GLUCOSE BLOOD VI STRP: ORAL_STRIP | Status: DC

## 2012-02-27 MED ORDER — SAXAGLIPTIN-METFORMIN ER 5-1000 MG PO TB24: 1.0000 | ORAL_TABLET | Freq: Every day | ORAL | Status: DC

## 2012-02-27 MED ORDER — LISINOPRIL-HYDROCHLOROTHIAZIDE 20-12.5 MG PO TABS: 1.0000 | ORAL_TABLET | Freq: Every day | ORAL | Status: DC

## 2012-02-27 MED ORDER — ROSUVASTATIN CALCIUM 20 MG PO TABS: 20.0000 mg | ORAL_TABLET | Freq: Every day | ORAL | Status: DC

## 2012-02-27 NOTE — Patient Instructions (Addendum)
Continue all other medications as before - please remember to take all meds every day Please continue your efforts at being more active, low cholesterol diet, and weight control. You are otherwise up to date with prevention Thank you for enrolling in MyChart. Please follow the instructions below to securely access your online medical record. MyChart allows you to send messages to your doctor, view your test results, renew your prescriptions, schedule appointments, and more. To Log into MyChart, please go to [email or web address], and your Username is: [email or web address]  Please return in 6 mo with Lab testing done 3-5 days before

## 2012-02-27 NOTE — Progress Notes (Signed)
Subjective:    Patient ID: Juan [name], male    DOB: 29-Oct-1950, 61 y.o.   MRN: [id number]  HPI  Here for wellness and f/u;  Overall doing ok;  Pt denies CP, worsening SOB, DOE, wheezing, orthopnea, PND, worsening LE edema, palpitations, dizziness or syncope.  Pt denies neurological change such as new Headache, facial or extremity weakness.  Pt denies polydipsia, polyuria, or low sugar symptoms. Pt states overall good compliance with treatment and medications, good tolerability, and trying to follow lower cholesterol diet.  Pt denies worsening depressive symptoms, suicidal ideation or panic. No fever, wt loss, night sweats, loss of appetite, or other constitutional symptoms.  Pt states good ability with ADL's, low fall risk, home safety reviewed and adequate, no significant changes in hearing or vision, and occasionally active with exercise.  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  Admits to recent whole med noncompliance, and diet indiscretion.  Wt is down form 306 one yr ago. Declines flu shot this yr Past Medical History  Diagnosis Date  . Hypertension   . Gout   . Obesity   . Hyperlipidemia   . Diabetes mellitus     type II  . Paroxysmal atrial fibrillation     postoperatively  . Atrial fibrillation 03/09/2010  . DIABETES MELLITUS, TYPE II 07/27/2007  . GOUT 12/01/2006  . HYPERLIPIDEMIA 01/19/2007  . HYPERTENSION 12/01/2006  . 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 OBESITY, HX OF 12/01/2006   Past Surgical History  Procedure Date  . Cholecystectomy     Laparoscopic    reports that he has been smoking Pipe.  He does not have any smokeless tobacco history on file. He reports that he uses illicit drugs. His alcohol history not on file. family history includes Coronary artery disease in his father and Heart attack in his father. No Known Allergies Current Outpatient Prescriptions on File Prior to Visit  Medication Sig Dispense Refill  . allopurinol  (ZYLOPRIM) 100 MG tablet Take 1 tablet (100 mg total) by mouth daily.  90 tablet  3  . aspirin 81 MG EC tablet Take 1 tablet (81 mg total) by mouth daily. Swallow whole.  30 tablet  12  . ezetimibe (ZETIA) 10 MG tablet Take 1 tablet (10 mg total) by mouth daily. HOLD  90 tablet  3  . lisinopril-hydrochlorothiazide (PRINZIDE,ZESTORETIC) 20-12.5 MG per tablet Take 1 tablet by mouth daily.  90 tablet  3  . pioglitazone (ACTOS) 15 MG tablet Take 1 tablet (15 mg total) by mouth daily.  90 tablet  3  . rosuvastatin (CRESTOR) 20 MG tablet Take 1 tablet (20 mg total) by mouth daily.  90 tablet  3  . Saxagliptin-Metformin (KOMBIGLYZE XR) 07-998 MG TB24 Take 1 tablet by mouth daily.  90 tablet  3   Review of Systems Review of Systems  Constitutional: Negative for diaphoresis, activity change, appetite change and unexpected weight change.  HENT: Negative for hearing loss, ear pain, facial swelling, mouth sores and neck stiffness.   Eyes: Negative for pain, redness and visual disturbance.  Respiratory: Negative for shortness of breath and wheezing.   Cardiovascular: Negative for chest pain and palpitations.  Gastrointestinal: Negative for diarrhea, blood in stool, abdominal distention and rectal pain.  Genitourinary: Negative for hematuria, flank pain and decreased urine volume.  Musculoskeletal: Negative for myalgias and joint swelling.  Skin: Negative for color change and wound.  Neurological: Negative for syncope and numbness.  Hematological: Negative for adenopathy.  Psychiatric/Behavioral: Negative for hallucinations,  self-injury, decreased concentration and agitation.      Objective:   Physical Exam BP 140/88  Pulse 62  Temp 98.3 F (36.8 C) (Oral)  Ht 6\' 2"  (1.88 m)  Wt 293 lb 4 oz (133.017 kg)  BMI 37.65 kg/m2  SpO2 97% Physical Exam  VS noted Constitutional: Pt is oriented to person, place, and time. Appears well-developed and well-nourished.  Head: Normocephalic and atraumatic.   Right Ear: External ear normal.  Left Ear: External ear normal.  Nose: Nose normal.  Mouth/Throat: Oropharynx is clear and moist.  Eyes: Conjunctivae and EOM are normal. Pupils are equal, round, and reactive to light.  Neck: Normal range of motion. Neck supple. No JVD present. No tracheal deviation present.  Cardiovascular: Normal rate, regular rhythm, normal heart sounds and intact distal pulses.   Pulmonary/Chest: Effort normal and breath sounds normal.  Abdominal: Soft. Bowel sounds are normal. There is no tenderness.  Musculoskeletal: Normal range of motion. Exhibits no edema.  Lymphadenopathy:  Has no cervical adenopathy.  Neurological: Pt is alert and oriented to person, place, and time. Pt has normal reflexes. No cranial nerve deficit.  Skin: Skin is warm and dry. No rash noted.  Psychiatric:  Has  normal mood and affect. Behavior is normal. except 1+ nervous    Assessment & Plan:

## 2012-04-22 ENCOUNTER — Other Ambulatory Visit: Payer: Self-pay

## 2012-09-17 ENCOUNTER — Encounter: Payer: Self-pay | Admitting: Internal Medicine

## 2012-09-17 LAB — HM DIABETES EYE EXAM

## 2013-01-10 ENCOUNTER — Other Ambulatory Visit: Payer: Self-pay

## 2013-06-13 ENCOUNTER — Encounter: Payer: Self-pay | Admitting: Internal Medicine

## 2013-08-07 ENCOUNTER — Encounter: Payer: Self-pay | Admitting: Cardiology

## 2013-11-06 ENCOUNTER — Encounter: Payer: Self-pay | Admitting: Internal Medicine

## 2013-11-06 DIAGNOSIS — H5200 Hypermetropia, unspecified eye: Secondary | ICD-10-CM | POA: Diagnosis not present

## 2013-11-06 DIAGNOSIS — H2510 Age-related nuclear cataract, unspecified eye: Secondary | ICD-10-CM | POA: Diagnosis not present

## 2013-11-06 DIAGNOSIS — E119 Type 2 diabetes mellitus without complications: Secondary | ICD-10-CM | POA: Diagnosis not present

## 2013-11-06 DIAGNOSIS — H40019 Open angle with borderline findings, low risk, unspecified eye: Secondary | ICD-10-CM | POA: Diagnosis not present

## 2013-11-06 LAB — HM DIABETES EYE EXAM

## 2013-11-22 ENCOUNTER — Encounter: Payer: Self-pay | Admitting: Internal Medicine

## 2013-11-28 DIAGNOSIS — H2589 Other age-related cataract: Secondary | ICD-10-CM | POA: Diagnosis not present

## 2013-11-28 DIAGNOSIS — H2510 Age-related nuclear cataract, unspecified eye: Secondary | ICD-10-CM | POA: Diagnosis not present

## 2013-12-03 ENCOUNTER — Ambulatory Visit: Payer: 59 | Admitting: Internal Medicine

## 2013-12-04 ENCOUNTER — Ambulatory Visit (INDEPENDENT_AMBULATORY_CARE_PROVIDER_SITE_OTHER): Payer: Medicare Other | Admitting: Internal Medicine

## 2013-12-04 ENCOUNTER — Encounter: Payer: Self-pay | Admitting: Internal Medicine

## 2013-12-04 ENCOUNTER — Other Ambulatory Visit: Payer: Self-pay | Admitting: Internal Medicine

## 2013-12-04 ENCOUNTER — Other Ambulatory Visit (INDEPENDENT_AMBULATORY_CARE_PROVIDER_SITE_OTHER): Payer: Medicare Other

## 2013-12-04 VITALS — BP 148/98 | HR 79 | Temp 98.4°F | Wt 268.0 lb

## 2013-12-04 DIAGNOSIS — R972 Elevated prostate specific antigen [PSA]: Secondary | ICD-10-CM

## 2013-12-04 DIAGNOSIS — E119 Type 2 diabetes mellitus without complications: Secondary | ICD-10-CM

## 2013-12-04 DIAGNOSIS — I10 Essential (primary) hypertension: Secondary | ICD-10-CM | POA: Diagnosis not present

## 2013-12-04 DIAGNOSIS — Z23 Encounter for immunization: Secondary | ICD-10-CM | POA: Diagnosis not present

## 2013-12-04 DIAGNOSIS — E785 Hyperlipidemia, unspecified: Secondary | ICD-10-CM

## 2013-12-04 DIAGNOSIS — Z0000 Encounter for general adult medical examination without abnormal findings: Secondary | ICD-10-CM

## 2013-12-04 LAB — URINALYSIS, ROUTINE W REFLEX MICROSCOPIC
HGB URINE DIPSTICK: NEGATIVE
KETONES UR: 15 — AB
Leukocytes, UA: NEGATIVE
Nitrite: NEGATIVE
Specific Gravity, Urine: 1.025 (ref 1.000–1.030)
Urobilinogen, UA: 0.2 (ref 0.0–1.0)
pH: 6 (ref 5.0–8.0)

## 2013-12-04 LAB — HEMOGLOBIN A1C: Hgb A1c MFr Bld: 9.9 % — ABNORMAL HIGH (ref 4.6–6.5)

## 2013-12-04 LAB — MICROALBUMIN / CREATININE URINE RATIO
Creatinine,U: 334 mg/dL
MICROALB UR: 9.2 mg/dL — AB (ref 0.0–1.9)
Microalb Creat Ratio: 2.8 mg/g (ref 0.0–30.0)

## 2013-12-04 LAB — LIPID PANEL
CHOL/HDL RATIO: 6
Cholesterol: 252 mg/dL — ABNORMAL HIGH (ref 0–200)
HDL: 44.3 mg/dL (ref >39.00)
LDL CALC: 178 mg/dL — AB (ref 0–99)
NonHDL: 207.7
Triglycerides: 147 mg/dL (ref 0.0–149.0)
VLDL: 29.4 mg/dL (ref 0.0–40.0)

## 2013-12-04 LAB — HEPATIC FUNCTION PANEL
ALK PHOS: 73 U/L (ref 39–117)
ALT: 29 U/L (ref 0–53)
AST: 34 U/L (ref 0–37)
Albumin: 3.8 g/dL (ref 3.5–5.2)
BILIRUBIN DIRECT: 0.2 mg/dL (ref 0.0–0.3)
TOTAL PROTEIN: 7.6 g/dL (ref 6.0–8.3)
Total Bilirubin: 1 mg/dL (ref 0.2–1.2)

## 2013-12-04 LAB — TSH: TSH: 1.89 u[IU]/mL (ref 0.35–4.50)

## 2013-12-04 LAB — CBC WITH DIFFERENTIAL/PLATELET
BASOS ABS: 0.1 10*3/uL (ref 0.0–0.1)
Basophils Relative: 0.9 % (ref 0.0–3.0)
Eosinophils Absolute: 0.1 10*3/uL (ref 0.0–0.7)
Eosinophils Relative: 1.1 % (ref 0.0–5.0)
HCT: 49.8 % (ref 39.0–52.0)
HEMOGLOBIN: 17.1 g/dL — AB (ref 13.0–17.0)
LYMPHS PCT: 26.5 % (ref 12.0–46.0)
Lymphs Abs: 2.2 10*3/uL (ref 0.7–4.0)
MCHC: 34.3 g/dL (ref 30.0–36.0)
MCV: 94.6 fl (ref 78.0–100.0)
MONO ABS: 0.4 10*3/uL (ref 0.1–1.0)
Monocytes Relative: 5.3 % (ref 3.0–12.0)
NEUTROS ABS: 5.6 10*3/uL (ref 1.4–7.7)
Neutrophils Relative %: 66.2 % (ref 43.0–77.0)
Platelets: 241 10*3/uL (ref 150.0–400.0)
RBC: 5.27 Mil/uL (ref 4.22–5.81)
RDW: 12.6 % (ref 11.5–15.5)
WBC: 8.4 10*3/uL (ref 4.0–10.5)

## 2013-12-04 LAB — BASIC METABOLIC PANEL
BUN: 11 mg/dL (ref 6–23)
CO2: 29 mEq/L (ref 19–32)
Calcium: 9.5 mg/dL (ref 8.4–10.5)
Chloride: 102 mEq/L (ref 96–112)
Creatinine, Ser: 1 mg/dL (ref 0.4–1.5)
GFR: 81.56 mL/min (ref >60.00)
Glucose, Bld: 216 mg/dL — ABNORMAL HIGH (ref 70–99)
Potassium: 4.3 mEq/L (ref 3.5–5.1)
SODIUM: 134 meq/L — AB (ref 135–145)

## 2013-12-04 LAB — PSA: PSA: 1.35 ng/mL (ref 0.10–4.00)

## 2013-12-04 MED ORDER — ALLOPURINOL 100 MG PO TABS: 100.0000 mg | ORAL_TABLET | Freq: Every day | ORAL | Status: AC

## 2013-12-04 MED ORDER — PIOGLITAZONE HCL 30 MG PO TABS: 30.0000 mg | ORAL_TABLET | Freq: Every day | ORAL | Status: DC

## 2013-12-04 MED ORDER — ASPIRIN EC 81 MG PO TBEC: 81.0000 mg | DELAYED_RELEASE_TABLET | Freq: Every day | ORAL | Status: DC

## 2013-12-04 MED ORDER — LISINOPRIL-HYDROCHLOROTHIAZIDE 20-12.5 MG PO TABS: 1.0000 | ORAL_TABLET | Freq: Every day | ORAL | Status: DC

## 2013-12-04 MED ORDER — ROSUVASTATIN CALCIUM 20 MG PO TABS: 20.0000 mg | ORAL_TABLET | Freq: Every day | ORAL | Status: DC

## 2013-12-04 MED ORDER — METFORMIN HCL ER 500 MG PO TB24: ORAL_TABLET | ORAL | Status: DC

## 2013-12-04 NOTE — Addendum Note (Signed)
Addended by: Sharon Seller B on: 12/04/2013 04:42 PM   Modules accepted: Orders

## 2013-12-04 NOTE — Progress Notes (Signed)
Pre visit review using our clinic review tool, if applicable. No additional management support is needed unless otherwise documented below in the visit note. 

## 2013-12-04 NOTE — Patient Instructions (Addendum)
You had the new Prevnar pneumonia shot  You will be contacted regarding the referral for: colonoscopy  Please start Aspirin 81 mg per day  Please re-start the Blood Pressure medication as before, as well as the crestor, and allopurinol, and refills have been sent to [facility]  We will have to check the A1c to see about any Diabetes medication  Please have the pharmacy call with any other refills you may need.  Please continue your efforts at being more active, low cholesterol diet, and weight control.  You are otherwise up to date with prevention measures today.  Please keep your appointments with your specialists as you may have planned  Please go to the LAB in the Basement (turn left off the elevator) for the tests to be done today  You will be contacted by phone if any changes need to be made immediately.  Otherwise, you will receive a letter about your results with an explanation, but please check with MyChart first.  Please remember to sign up for MyChart if you have not done so, as this will be important to you in the future with finding out test results, communicating by private email, and scheduling acute appointments online when needed.  Please return in 6 months, or sooner if needed

## 2013-12-04 NOTE — Progress Notes (Signed)
   Subjective:    Patient ID: Juan [name], male    DOB: Oct 19, 1950, 63 y.o.   MRN: [id number]  HPI  Here for yearly f/u;  Overall doing ok;  Pt denies CP, worsening SOB, DOE, wheezing, orthopnea, PND, worsening LE edema, palpitations, dizziness or syncope.  Pt denies neurological change such as new headache, facial or extremity weakness.  Pt denies polydipsia, polyuria, or low sugar symptoms. Pt states overall good compliance with treatment and medications, good tolerability, and has been trying to follow lower cholesterol diet.  Pt denies worsening depressive symptoms, suicidal ideation or panic. No fever, night sweats, unintent wt loss, loss of appetite, or other constitutional symptoms.  Pt states good ability with ADL's, has low fall risk, home safety reviewed and adequate, no other significant changes in hearing or vision, and only rarely active with exercise, wife is chronicaloy ill. Had recetn elev BP at visit to optho for cataract surgury , now s/p bilat cataract, 20/20 far vision.  Has been trying to lose wt as well- from 283 to 268 over 2 yrs.  Lost to f/u since 2013, due to lack of insurance Wt Readings from Last 3 Encounters:  12/04/13 268 lb (121.564 kg)  02/27/12 293 lb 4 oz (133.017 kg)  08/30/11 283 lb 8 oz (128.595 kg)   Declines flu shot, ok for prevnar Review of Systems Constitutional: Negative for increased diaphoresis, other activity, appetite or other siginficant weight change  HENT: Negative for worsening hearing loss, ear pain, facial swelling, mouth sores and neck stiffness.   Eyes: Negative for other worsening pain, redness or visual disturbance.  Respiratory: Negative for shortness of breath and wheezing.   Cardiovascular: Negative for chest pain and palpitations.  Gastrointestinal: Negative for diarrhea, blood in stool, abdominal distention or other pain Genitourinary: Negative for hematuria, flank pain or change in urine volume.  Musculoskeletal: Negative for  myalgias or other joint complaints.  Skin: Negative for color change and wound.  Neurological: Negative for syncope and numbness. other than noted Hematological: Negative for adenopathy. or other swelling Psychiatric/Behavioral: Negative for hallucinations, self-injury, decreased concentration or other worsening agitation.      Objective:   Physical Exam BP 148/98  Pulse 79  Temp(Src) 98.4 F (36.9 C) (Oral)  Wt 268 lb (121.564 kg)  SpO2 97% VS noted,  Constitutional: Pt is oriented to person, place, and time. Appears well-developed and well-nourished.  Head: Normocephalic and atraumatic.  Right Ear: External ear normal.  Left Ear: External ear normal.  Nose: Nose normal.  Mouth/Throat: Oropharynx is clear and moist.  Eyes: Conjunctivae and EOM are normal. Pupils are equal, round, and reactive to light.  Neck: Normal range of motion. Neck supple. No JVD present. No tracheal deviation present.  Cardiovascular: Normal rate, regular rhythm, normal heart sounds and intact distal pulses.   Pulmonary/Chest: Effort normal and breath sounds without rales or wheezing  Abdominal: Soft. Bowel sounds are normal. NT. No HSM  Musculoskeletal: Normal range of motion. Exhibits no edema.  Lymphadenopathy:  Has no cervical adenopathy.  Neurological: Pt is alert and oriented to person, place, and time. Pt has normal reflexes. No cranial nerve deficit. Motor grossly intact Skin: Skin is warm and dry. No rash noted.  Psychiatric:  Has mild nervous mood and affect. Behavior is normal.     Assessment & Plan:

## 2013-12-11 ENCOUNTER — Encounter: Payer: Self-pay | Admitting: Internal Medicine

## 2014-01-29 ENCOUNTER — Ambulatory Visit (AMBULATORY_SURGERY_CENTER): Payer: Self-pay | Admitting: *Deleted

## 2014-01-29 VITALS — Ht 74.0 in | Wt 267.6 lb

## 2014-01-29 DIAGNOSIS — Z86010 Personal history of colonic polyps: Secondary | ICD-10-CM

## 2014-01-29 NOTE — Progress Notes (Signed)
No egg or soy allergy. ewm No issues with past sedation. ewm No home 02 use. ewm No diet pills, no blood thinners. ewm Pt declined emmi. ewm

## 2014-02-12 ENCOUNTER — Ambulatory Visit (AMBULATORY_SURGERY_CENTER): Payer: Medicare Other | Admitting: Internal Medicine

## 2014-02-12 ENCOUNTER — Encounter: Payer: Self-pay | Admitting: Internal Medicine

## 2014-02-12 VITALS — BP 174/92 | HR 51 | Temp 97.8°F | Resp 24 | Ht 74.0 in | Wt 267.0 lb

## 2014-02-12 DIAGNOSIS — D120 Benign neoplasm of cecum: Secondary | ICD-10-CM

## 2014-02-12 DIAGNOSIS — E119 Type 2 diabetes mellitus without complications: Secondary | ICD-10-CM | POA: Diagnosis not present

## 2014-02-12 DIAGNOSIS — Z86010 Personal history of colonic polyps: Secondary | ICD-10-CM

## 2014-02-12 DIAGNOSIS — I4891 Unspecified atrial fibrillation: Secondary | ICD-10-CM | POA: Diagnosis not present

## 2014-02-12 MED ORDER — SODIUM CHLORIDE 0.9 % IV SOLN: 500.0000 mL | INTRAVENOUS | Status: DC

## 2014-02-12 NOTE — Progress Notes (Signed)
Called to room to assist during endoscopic procedure.  Patient ID and intended procedure confirmed with present staff. Received instructions for my participation in the procedure from the performing physician.  

## 2014-02-12 NOTE — Progress Notes (Signed)
A/ox3, pleased with MAC, report to RN 

## 2014-02-12 NOTE — Op Note (Signed)
[place]  [address]. [place], [id number]   COLONOSCOPY PROCEDURE REPORT  PATIENT: Juan, [name]  MR#: [id number] BIRTHDATE: [birth year] , 31  yrs. old GENDER: male ENDOSCOPIST: Gatha Mayer, MD, [facility] PROCEDURE DATE:  02/12/2014 PROCEDURE:   Colonoscopy with snare polypectomy First Screening Colonoscopy - Avg.  risk and is 50 yrs.  old or older - No.  Prior Negative Screening - Now for repeat screening. N/A  History of Adenoma - Now for follow-up colonoscopy & has been > or = to 3 yrs.  Yes hx of adenoma.  Has been 3 or more years since last colonoscopy.  Polyps Removed Today? Yes. ASA CLASS:   Class III INDICATIONS:surveillance colonoscopy based on a history of adenomatous colonic polyp(s). MEDICATIONS: Propofol 250 mg IV and Monitored anesthesia care  DESCRIPTION OF PROCEDURE:   After the risks benefits and alternatives of the procedure were thoroughly explained, informed consent was obtained.  The digital rectal exam revealed no abnormalities of the rectum, revealed no prostatic nodules, and revealed the prostate was not enlarged.   The [id number] endoscope was introduced through the anus and advanced to the cecum, which was identified by both the appendix and ileocecal valve. No adverse events experienced.   The quality of the prep was good, using MiraLax  The instrument was then slowly withdrawn as the colon was fully examined.      COLON FINDINGS: 1) Two 5-6 mm cecal polyps removed completely and sent to pathology using cold snare.  2) Mild right sided-diverticulosis 3) Otherwise normal colonoscopy.  Retroflexed views revealed no abnormalities. The time to cecum=1 minutes 45 seconds.  Withdrawal time=11 minutes 25 seconds.  The scope was withdrawn and the procedure completed. COMPLICATIONS: There were no immediate complications.  ENDOSCOPIC IMPRESSION: 1) Two 5-6 mm cecal polyps removed 2) Mild right sided-diverticulosis 3)  Otherwise normal colonoscopy - good prep - hx adenoma 2010  RECOMMENDATIONS: Timing of repeat colonoscopy will be determined by pathology findings.  eSigned:  Gatha Mayer, MD, [facility] 02/12/2014 10:57 AM   cc: The Patient

## 2014-02-12 NOTE — Patient Instructions (Addendum)
I found and removed 2 small polyps. You also have a condition called diverticulosis - common and not usually a problem. Please read the handout provided.  I will let you know pathology results and when to have another routine colonoscopy by mail.  I appreciate the opportunity to care for you. Gatha Mayer, MD, [facility]   Discharge instructions given. Handouts on polyps and diverticulosis. Resume previous medications. YOU HAD AN ENDOSCOPIC PROCEDURE TODAY AT [facility]: Refer to the procedure report that was given to you for any specific questions about what was found during the examination.  If the procedure report does not answer your questions, please call your gastroenterologist to clarify.  If you requested that your care partner not be given the details of your procedure findings, then the procedure report has been included in a sealed envelope for you to review at your convenience later.  YOU SHOULD EXPECT: Some feelings of bloating in the abdomen. Passage of more gas than usual.  Walking can help get rid of the air that was put into your GI tract during the procedure and reduce the bloating. If you had a lower endoscopy (such as a colonoscopy or flexible sigmoidoscopy) you may notice spotting of blood in your stool or on the toilet paper. If you underwent a bowel prep for your procedure, then you may not have a normal bowel movement for a few days.  DIET: Your first meal following the procedure should be a light meal and then it is ok to progress to your normal diet.  A half-sandwich or bowl of soup is an example of a good first meal.  Heavy or fried foods are harder to digest and may make you feel nauseous or bloated.  Likewise meals heavy in dairy and vegetables can cause extra gas to form and this can also increase the bloating.  Drink plenty of fluids but you should avoid alcoholic beverages for 24 hours.  ACTIVITY: Your care partner should take you home  directly after the procedure.  You should plan to take it easy, moving slowly for the rest of the day.  You can resume normal activity the day after the procedure however you should NOT DRIVE or use heavy machinery for 24 hours (because of the sedation medicines used during the test).    SYMPTOMS TO REPORT IMMEDIATELY: A gastroenterologist can be reached at any hour.  During normal business hours, 8:30 AM to 5:00 PM Monday through Friday, call [phone].  After hours and on weekends, please call the GI answering service at [phone] who will take a message and have the physician on call contact you.   Following lower endoscopy (colonoscopy or flexible sigmoidoscopy):  Excessive amounts of blood in the stool  Significant tenderness or worsening of abdominal pains  Swelling of the abdomen that is new, acute  Fever of 100F or higher  FOLLOW UP: If any biopsies were taken you will be contacted by phone or by letter within the next 1-3 weeks.  Call your gastroenterologist if you have not heard about the biopsies in 3 weeks.  Our staff will call the home number listed on your records the next business day following your procedure to check on you and address any questions or concerns that you may have at that time regarding the information given to you following your procedure. This is a courtesy call and so if there is no answer at the home number and we have not heard from  you through the emergency physician on call, we will assume that you have returned to your regular daily activities without incident.  SIGNATURES/CONFIDENTIALITY: You and/or your care partner have signed paperwork which will be entered into your electronic medical record.  These signatures attest to the fact that that the information above on your After Visit Summary has been reviewed and is understood.  Full responsibility of the confidentiality of this discharge information lies with you and/or your care-partner.

## 2014-02-13 ENCOUNTER — Telehealth: Payer: Self-pay | Admitting: *Deleted

## 2014-02-13 NOTE — Telephone Encounter (Signed)
  Follow up Call-  Call back number 02/12/2014  Post procedure Call Back phone  # [phone]  Permission to leave phone message Yes     Patient questions:  Do you have a fever, pain , or abdominal swelling? No. Pain Score  0 *  Have you tolerated food without any problems? Yes.    Have you been able to return to your normal activities? Yes.    Do you have any questions about your discharge instructions: Diet   No. Medications  No. Follow up visit  No.  Do you have questions or concerns about your Care? No.  Actions: * If pain score is 4 or above: No action needed, pain <4.

## 2014-02-18 ENCOUNTER — Encounter: Payer: Self-pay | Admitting: Internal Medicine

## 2014-02-18 NOTE — Progress Notes (Signed)
Quick Note:  1 adenoma Repeat colonoscopy 2020/1 ______

## 2014-06-03 ENCOUNTER — Ambulatory Visit (INDEPENDENT_AMBULATORY_CARE_PROVIDER_SITE_OTHER): Payer: Medicare Other | Admitting: Internal Medicine

## 2014-06-03 ENCOUNTER — Encounter: Payer: Self-pay | Admitting: Internal Medicine

## 2014-06-03 ENCOUNTER — Telehealth: Payer: Self-pay

## 2014-06-03 ENCOUNTER — Other Ambulatory Visit: Payer: Self-pay | Admitting: Internal Medicine

## 2014-06-03 ENCOUNTER — Other Ambulatory Visit (INDEPENDENT_AMBULATORY_CARE_PROVIDER_SITE_OTHER): Payer: Medicare Other

## 2014-06-03 VITALS — BP 138/86 | HR 71 | Temp 97.8°F | Resp 18 | Ht 74.0 in | Wt 269.2 lb

## 2014-06-03 DIAGNOSIS — E785 Hyperlipidemia, unspecified: Secondary | ICD-10-CM

## 2014-06-03 DIAGNOSIS — E119 Type 2 diabetes mellitus without complications: Secondary | ICD-10-CM | POA: Diagnosis not present

## 2014-06-03 DIAGNOSIS — I10 Essential (primary) hypertension: Secondary | ICD-10-CM | POA: Diagnosis not present

## 2014-06-03 LAB — HEPATIC FUNCTION PANEL
ALK PHOS: 77 U/L (ref 39–117)
ALT: 10 U/L (ref 0–53)
AST: 16 U/L (ref 0–37)
Albumin: 4 g/dL (ref 3.5–5.2)
BILIRUBIN TOTAL: 0.7 mg/dL (ref 0.2–1.2)
Bilirubin, Direct: 0.1 mg/dL (ref 0.0–0.3)
Total Protein: 7.7 g/dL (ref 6.0–8.3)

## 2014-06-03 LAB — LIPID PANEL
CHOLESTEROL: 255 mg/dL — AB (ref 0–200)
HDL: 42.3 mg/dL (ref >39.00)
NonHDL: 212.7
Total CHOL/HDL Ratio: 6
Triglycerides: 241 mg/dL — ABNORMAL HIGH (ref 0.0–149.0)
VLDL: 48.2 mg/dL — AB (ref 0.0–40.0)

## 2014-06-03 LAB — BASIC METABOLIC PANEL
BUN: 9 mg/dL (ref 6–23)
CHLORIDE: 101 meq/L (ref 96–112)
CO2: 27 mEq/L (ref 19–32)
Calcium: 9.5 mg/dL (ref 8.4–10.5)
Creatinine, Ser: 1.02 mg/dL (ref 0.40–1.50)
GFR: 77.76 mL/min (ref >60.00)
GLUCOSE: 237 mg/dL — AB (ref 70–99)
POTASSIUM: 3.9 meq/L (ref 3.5–5.1)
SODIUM: 134 meq/L — AB (ref 135–145)

## 2014-06-03 LAB — LDL CHOLESTEROL, DIRECT: LDL DIRECT: 193 mg/dL

## 2014-06-03 LAB — HEMOGLOBIN A1C: Hgb A1c MFr Bld: 9.2 % — ABNORMAL HIGH (ref 4.6–6.5)

## 2014-06-03 MED ORDER — ZOLPIDEM TARTRATE 5 MG PO TABS: 5.0000 mg | ORAL_TABLET | Freq: Every evening | ORAL | Status: DC | PRN

## 2014-06-03 MED ORDER — GLIPIZIDE ER 10 MG PO TB24: 10.0000 mg | ORAL_TABLET | Freq: Every day | ORAL | Status: DC

## 2014-06-03 MED ORDER — ATORVASTATIN CALCIUM 80 MG PO TABS: 80.0000 mg | ORAL_TABLET | Freq: Every day | ORAL | Status: AC

## 2014-06-03 NOTE — Assessment & Plan Note (Signed)
stable overall by history and exam, recent data reviewed with pt, and pt to continue medical treatment as before,  to f/u any worsening symptoms or concerns BP Readings from Last 3 Encounters:  06/03/14 138/86  02/12/14 174/92  12/04/13 148/98

## 2014-06-03 NOTE — Assessment & Plan Note (Signed)
stable overall by history and exam, recent data reviewed with pt, and pt to continue medical treatment as before except change crestor to lipitor 80,  to f/u any worsening symptoms or concerns .lastld

## 2014-06-03 NOTE — Assessment & Plan Note (Signed)
Admits to some noncompliacne, o/w stable overall by history and exam, recent data reviewed with pt, and pt to continue medical treatment as before,  to f/u any worsening symptoms or concerns Lab Results  Component Value Date   HGBA1C 9.9* 12/04/2013   Forf/u labs

## 2014-06-03 NOTE — Telephone Encounter (Signed)
Patient was seen today and forgot to ask for a Rx refill for his ambien. If you can print and sign it, I will fax to the pharmacy for the patient.

## 2014-06-03 NOTE — Telephone Encounter (Signed)
Done hardcopy to Cherina  

## 2014-06-03 NOTE — Progress Notes (Signed)
Subjective:    Patient ID: Juan [name], male    DOB: 03/13/1950, 64 y.o.   MRN: [id number]  HPI  Here to f/u; overall doing ok,  Pt denies chest pain, increasing sob or doe, wheezing, orthopnea, PND, increased LE swelling, palpitations, dizziness or syncope.  Pt denies new neurological symptoms such as new headache, or facial or extremity weakness or numbness.  Pt denies polydipsia, polyuria, or low sugar episode.   Pt denies new neurological symptoms such as new headache, or facial or extremity weakness or numbness.   Pt states overall good compliance with meds, mostly trying to follow appropriate diet, with wt overall stable,  but little exercise however.  Has missed several DM meds he admits. Crestor too expensive.  Mentions "my sleep cycle got screwed up."  C/o 1 mo nighlty insomnia so that cant get to sleep most nights on time, wife has dementia/psych issues and needs to sleep regular hours instead of during day which often happens.  Has some snoring but no other signifcaint daytime somnolence. Very frustrating.   Past Medical History  Diagnosis Date  . Hypertension   . Gout   . Obesity   . Hyperlipidemia   . Diabetes mellitus     type II  . Paroxysmal atrial fibrillation     postoperatively  . DIABETES MELLITUS, TYPE II 07/27/2007  . GOUT 12/01/2006  . HYPERLIPIDEMIA 01/19/2007  . HYPERTENSION 12/01/2006  . 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 OBESITY, HX OF 12/01/2006  . Cataract   . Hx of colonic polyps 05/30/2008   Past Surgical History  Procedure Laterality Date  . Cholecystectomy      Laparoscopic  . Cataract extraction      bilateral  . Colonoscopy      reports that he has been smoking Pipe.  He has never used smokeless tobacco. He reports that he drinks alcohol. He reports that he does not use illicit drugs. family history includes Coronary artery disease in his father; Heart attack in his father. There is no history of Colon cancer. No Known Allergies Current  Outpatient Prescriptions on File Prior to Visit  Medication Sig Dispense Refill  . allopurinol (ZYLOPRIM) 100 MG tablet Take 1 tablet (100 mg total) by mouth daily. 90 tablet 3  . aspirin EC 81 MG tablet Take 1 tablet (81 mg total) by mouth daily. 90 tablet 11  . glucose blood (ONE TOUCH TEST STRIPS) test strip Use as directed 1 per day 250.02 100 each 11  . lisinopril-hydrochlorothiazide (PRINZIDE,ZESTORETIC) 20-12.5 MG per tablet Take 1 tablet by mouth daily. 90 tablet 3  . metFORMIN (GLUCOPHAGE-XR) 500 MG 24 hr tablet 2 tabs by mouth in the AM 180 tablet 3  . rosuvastatin (CRESTOR) 20 MG tablet Take 1 tablet (20 mg total) by mouth daily. 90 tablet 3   No current facility-administered medications on file prior to visit.    Review of Systems  Constitutional: Negative for unusual diaphoresis or night sweats HENT: Negative for ringing in ear or discharge Eyes: Negative for double vision or worsening visual disturbance.  Respiratory: Negative for choking and stridor.   Gastrointestinal: Negative for vomiting or other signifcant bowel change Genitourinary: Negative for hematuria or change in urine volume.  Musculoskeletal: Negative for other MSK pain or swelling Skin: Negative for color change and worsening wound.  Neurological: Negative for tremors and numbness other than noted  Psychiatric/Behavioral: Negative for decreased concentration or agitation other than above       Objective:  Physical Exam BP 138/86 mmHg  Pulse 71  Temp(Src) 97.8 F (36.6 C) (Oral)  Resp 18  Ht 6\' 2"  (1.88 m)  Wt 269 lb 3.2 oz (122.108 kg)  BMI 34.55 kg/m2  SpO2 96% VS noted,  Constitutional: Pt appears in no significant distress HENT: Head: NCAT.  Right Ear: External ear normal.  Left Ear: External ear normal.  Eyes: . Pupils are equal, round, and reactive to light. Conjunctivae and EOM are normal Neck: Normal range of motion. Neck supple.  Cardiovascular: Normal rate and regular rhythm.     Pulmonary/Chest: Effort normal and breath sounds without rales or wheezing.  Abd:  Soft, NT, ND, + BS Neurological: Pt is alert. Not confused , motor grossly intact Skin: Skin is warm. No rash, no LE edema Psychiatric: Pt behavior is normal. No agitation.      Assessment & Plan:

## 2014-06-03 NOTE — Addendum Note (Signed)
Addended by: Biagio Borg on: 06/03/2014 05:21 PM   Modules accepted: Orders

## 2014-06-03 NOTE — Progress Notes (Signed)
Pre visit review using our clinic review tool, if applicable. No additional management support is needed unless otherwise documented below in the visit note. 

## 2014-06-03 NOTE — Patient Instructions (Addendum)
OK to stop the crestor  Please take all new medication as prescribed - the lipitor  Please continue all other medications as before, and refills have been done if requested.  Please have the pharmacy call with any other refills you may need.  Please continue your efforts at being more active, low cholesterol diet, and weight control.  You are otherwise up to date with prevention measures today.  Please keep your appointments with your specialists as you may have planned  Please go to the LAB in the Basement (turn left off the elevator) for the tests to be done today  You will be contacted by phone if any changes need to be made immediately.  Otherwise, you will receive a letter about your results with an explanation, but please check with MyChart first.  Please remember to sign up for MyChart if you have not done so, as this will be important to you in the future with finding out test results, communicating by private email, and scheduling acute appointments online when needed.  Please return in 6 months, or sooner if needed

## 2014-06-04 NOTE — Telephone Encounter (Signed)
Faxed RX to pharmacy.  

## 2014-08-18 ENCOUNTER — Emergency Department (HOSPITAL_COMMUNITY): Payer: Medicare Other

## 2014-08-18 ENCOUNTER — Inpatient Hospital Stay (HOSPITAL_COMMUNITY)
Admission: EM | Admit: 2014-08-18 | Discharge: 2014-08-21 | DRG: 065 | Disposition: A | Discharge status: Skilled Nursing Facility | Payer: Medicare Other | Attending: Internal Medicine | Admitting: Internal Medicine

## 2014-08-18 ENCOUNTER — Inpatient Hospital Stay (HOSPITAL_COMMUNITY): Payer: Medicare Other

## 2014-08-18 ENCOUNTER — Other Ambulatory Visit (HOSPITAL_COMMUNITY): Payer: Self-pay

## 2014-08-18 ENCOUNTER — Encounter (HOSPITAL_COMMUNITY): Payer: Self-pay

## 2014-08-18 DIAGNOSIS — F17290 Nicotine dependence, other tobacco product, uncomplicated: Secondary | ICD-10-CM | POA: Diagnosis present

## 2014-08-18 DIAGNOSIS — I472 Ventricular tachycardia: Secondary | ICD-10-CM | POA: Diagnosis not present

## 2014-08-18 DIAGNOSIS — I6930 Unspecified sequelae of cerebral infarction: Secondary | ICD-10-CM | POA: Diagnosis present

## 2014-08-18 DIAGNOSIS — M109 Gout, unspecified: Secondary | ICD-10-CM | POA: Diagnosis present

## 2014-08-18 DIAGNOSIS — I69320 Aphasia following cerebral infarction: Secondary | ICD-10-CM | POA: Diagnosis not present

## 2014-08-18 DIAGNOSIS — E118 Type 2 diabetes mellitus with unspecified complications: Secondary | ICD-10-CM

## 2014-08-18 DIAGNOSIS — M6281 Muscle weakness (generalized): Secondary | ICD-10-CM | POA: Diagnosis not present

## 2014-08-18 DIAGNOSIS — I6601 Occlusion and stenosis of right middle cerebral artery: Secondary | ICD-10-CM | POA: Diagnosis not present

## 2014-08-18 DIAGNOSIS — S299XXA Unspecified injury of thorax, initial encounter: Secondary | ICD-10-CM | POA: Diagnosis not present

## 2014-08-18 DIAGNOSIS — S0990XA Unspecified injury of head, initial encounter: Secondary | ICD-10-CM | POA: Diagnosis not present

## 2014-08-18 DIAGNOSIS — I480 Paroxysmal atrial fibrillation: Secondary | ICD-10-CM | POA: Diagnosis present

## 2014-08-18 DIAGNOSIS — Z9181 History of falling: Secondary | ICD-10-CM | POA: Diagnosis not present

## 2014-08-18 DIAGNOSIS — R9431 Abnormal electrocardiogram [ECG] [EKG]: Secondary | ICD-10-CM | POA: Diagnosis not present

## 2014-08-18 DIAGNOSIS — E669 Obesity, unspecified: Secondary | ICD-10-CM | POA: Diagnosis present

## 2014-08-18 DIAGNOSIS — I6789 Other cerebrovascular disease: Secondary | ICD-10-CM | POA: Diagnosis not present

## 2014-08-18 DIAGNOSIS — G8190 Hemiplegia, unspecified affecting unspecified side: Secondary | ICD-10-CM | POA: Diagnosis not present

## 2014-08-18 DIAGNOSIS — Z9114 Patient's other noncompliance with medication regimen: Secondary | ICD-10-CM | POA: Diagnosis present

## 2014-08-18 DIAGNOSIS — R1312 Dysphagia, oropharyngeal phase: Secondary | ICD-10-CM | POA: Diagnosis not present

## 2014-08-18 DIAGNOSIS — E1159 Type 2 diabetes mellitus with other circulatory complications: Secondary | ICD-10-CM

## 2014-08-18 DIAGNOSIS — I4510 Unspecified right bundle-branch block: Secondary | ICD-10-CM | POA: Diagnosis not present

## 2014-08-18 DIAGNOSIS — I63431 Cerebral infarction due to embolism of right posterior cerebral artery: Secondary | ICD-10-CM | POA: Diagnosis present

## 2014-08-18 DIAGNOSIS — E1165 Type 2 diabetes mellitus with hyperglycemia: Secondary | ICD-10-CM | POA: Diagnosis present

## 2014-08-18 DIAGNOSIS — G8194 Hemiplegia, unspecified affecting left nondominant side: Secondary | ICD-10-CM | POA: Diagnosis present

## 2014-08-18 DIAGNOSIS — I639 Cerebral infarction, unspecified: Secondary | ICD-10-CM

## 2014-08-18 DIAGNOSIS — I69359 Hemiplegia and hemiparesis following cerebral infarction affecting unspecified side: Secondary | ICD-10-CM | POA: Diagnosis not present

## 2014-08-18 DIAGNOSIS — Z79899 Other long term (current) drug therapy: Secondary | ICD-10-CM | POA: Diagnosis not present

## 2014-08-18 DIAGNOSIS — E876 Hypokalemia: Secondary | ICD-10-CM | POA: Diagnosis present

## 2014-08-18 DIAGNOSIS — R269 Unspecified abnormalities of gait and mobility: Secondary | ICD-10-CM | POA: Diagnosis present

## 2014-08-18 DIAGNOSIS — I4891 Unspecified atrial fibrillation: Secondary | ICD-10-CM | POA: Diagnosis present

## 2014-08-18 DIAGNOSIS — I63421 Cerebral infarction due to embolism of right anterior cerebral artery: Secondary | ICD-10-CM | POA: Diagnosis present

## 2014-08-18 DIAGNOSIS — R404 Transient alteration of awareness: Secondary | ICD-10-CM | POA: Diagnosis not present

## 2014-08-18 DIAGNOSIS — I340 Nonrheumatic mitral (valve) insufficiency: Secondary | ICD-10-CM | POA: Diagnosis not present

## 2014-08-18 DIAGNOSIS — E785 Hyperlipidemia, unspecified: Secondary | ICD-10-CM | POA: Diagnosis present

## 2014-08-18 DIAGNOSIS — Z7982 Long term (current) use of aspirin: Secondary | ICD-10-CM

## 2014-08-18 DIAGNOSIS — I6350 Cerebral infarction due to unspecified occlusion or stenosis of unspecified cerebral artery: Secondary | ICD-10-CM | POA: Diagnosis not present

## 2014-08-18 DIAGNOSIS — Z6832 Body mass index (BMI) 32.0-32.9, adult: Secondary | ICD-10-CM

## 2014-08-18 DIAGNOSIS — R531 Weakness: Secondary | ICD-10-CM | POA: Diagnosis present

## 2014-08-18 DIAGNOSIS — I10 Essential (primary) hypertension: Secondary | ICD-10-CM | POA: Diagnosis present

## 2014-08-18 DIAGNOSIS — R278 Other lack of coordination: Secondary | ICD-10-CM | POA: Diagnosis not present

## 2014-08-18 DIAGNOSIS — IMO0002 Reserved for concepts with insufficient information to code with codable children: Secondary | ICD-10-CM

## 2014-08-18 DIAGNOSIS — I4729 Other ventricular tachycardia: Secondary | ICD-10-CM | POA: Insufficient documentation

## 2014-08-18 LAB — DIFFERENTIAL
BASOS ABS: 0 10*3/uL (ref 0.0–0.1)
Basophils Relative: 0 % (ref 0–1)
Eosinophils Absolute: 0 10*3/uL (ref 0.0–0.7)
Eosinophils Relative: 0 % (ref 0–5)
LYMPHS PCT: 17 % (ref 12–46)
Lymphs Abs: 1.6 10*3/uL (ref 0.7–4.0)
Monocytes Absolute: 0.5 10*3/uL (ref 0.1–1.0)
Monocytes Relative: 5 % (ref 3–12)
NEUTROS ABS: 7 10*3/uL (ref 1.7–7.7)
NEUTROS PCT: 78 % — AB (ref 43–77)

## 2014-08-18 LAB — PROTIME-INR
INR: 1.11 (ref 0.00–1.49)
Prothrombin Time: 14.5 seconds (ref 11.6–15.2)

## 2014-08-18 LAB — CBC
HEMATOCRIT: 46 % (ref 39.0–52.0)
Hemoglobin: 16.2 g/dL (ref 13.0–17.0)
MCH: 31.4 pg (ref 26.0–34.0)
MCHC: 35.2 g/dL (ref 30.0–36.0)
MCV: 89.1 fL (ref 78.0–100.0)
PLATELETS: 243 10*3/uL (ref 150–400)
RBC: 5.16 MIL/uL (ref 4.22–5.81)
RDW: 12.4 % (ref 11.5–15.5)
WBC: 9.1 10*3/uL (ref 4.0–10.5)

## 2014-08-18 LAB — I-STAT CHEM 8, ED
BUN: 8 mg/dL (ref 6–20)
Calcium, Ion: 1.22 mmol/L (ref 1.13–1.30)
Chloride: 99 mmol/L — ABNORMAL LOW (ref 101–111)
Creatinine, Ser: 0.9 mg/dL (ref 0.61–1.24)
Glucose, Bld: 261 mg/dL — ABNORMAL HIGH (ref 65–99)
HEMATOCRIT: 49 % (ref 39.0–52.0)
HEMOGLOBIN: 16.7 g/dL (ref 13.0–17.0)
POTASSIUM: 3.5 mmol/L (ref 3.5–5.1)
SODIUM: 139 mmol/L (ref 135–145)
TCO2: 25 mmol/L (ref 0–100)

## 2014-08-18 LAB — COMPREHENSIVE METABOLIC PANEL
ALBUMIN: 3.4 g/dL — AB (ref 3.5–5.0)
ALK PHOS: 75 U/L (ref 38–126)
ALT: 13 U/L — ABNORMAL LOW (ref 17–63)
ANION GAP: 9 (ref 5–15)
AST: 19 U/L (ref 15–41)
BUN: 8 mg/dL (ref 6–20)
CO2: 28 mmol/L (ref 22–32)
CREATININE: 1.03 mg/dL (ref 0.61–1.24)
Calcium: 9.2 mg/dL (ref 8.9–10.3)
Chloride: 99 mmol/L — ABNORMAL LOW (ref 101–111)
GFR calc non Af Amer: >60 mL/min (ref >60)
GLUCOSE: 264 mg/dL — AB (ref 65–99)
POTASSIUM: 3.4 mmol/L — AB (ref 3.5–5.1)
Sodium: 136 mmol/L (ref 135–145)
Total Bilirubin: 0.9 mg/dL (ref 0.3–1.2)
Total Protein: 7.1 g/dL (ref 6.5–8.1)

## 2014-08-18 LAB — ETHANOL

## 2014-08-18 LAB — APTT: aPTT: 26 seconds (ref 24–37)

## 2014-08-18 LAB — CK: CK TOTAL: 46 U/L — AB (ref 49–397)

## 2014-08-18 LAB — I-STAT TROPONIN, ED: TROPONIN I, POC: 0 ng/mL (ref 0.00–0.08)

## 2014-08-18 MED ORDER — ASPIRIN 325 MG PO TABS
325.0000 mg | ORAL_TABLET | Freq: Once | ORAL | Status: AC
  Administered 2014-08-18: 325 mg via ORAL
  Filled 2014-08-18: qty 1

## 2014-08-18 MED ORDER — HEPARIN SODIUM (PORCINE) 5000 UNIT/ML IJ SOLN
5000.0000 [IU] | Freq: Three times a day (TID) | INTRAMUSCULAR | Status: DC
  Administered 2014-08-19 – 2014-08-21 (×8): 5000 [IU] via SUBCUTANEOUS
  Filled 2014-08-18 (×9): qty 1

## 2014-08-18 MED ORDER — HYDRALAZINE HCL 20 MG/ML IJ SOLN
5.0000 mg | INTRAMUSCULAR | Status: DC | PRN
  Administered 2014-08-18 – 2014-08-20 (×2): 5 mg via INTRAVENOUS
  Filled 2014-08-18 (×2): qty 1

## 2014-08-18 MED ORDER — ATORVASTATIN CALCIUM 80 MG PO TABS
80.0000 mg | ORAL_TABLET | Freq: Every day | ORAL | Status: DC
  Administered 2014-08-19 – 2014-08-20 (×2): 80 mg via ORAL
  Filled 2014-08-18 (×3): qty 1

## 2014-08-18 MED ORDER — ASPIRIN 325 MG PO TABS
325.0000 mg | ORAL_TABLET | Freq: Every day | ORAL | Status: DC
  Administered 2014-08-19 – 2014-08-21 (×3): 325 mg via ORAL
  Filled 2014-08-18 (×3): qty 1

## 2014-08-18 MED ORDER — ASPIRIN 300 MG RE SUPP
300.0000 mg | Freq: Every day | RECTAL | Status: DC
  Filled 2014-08-18 (×3): qty 1

## 2014-08-18 MED ORDER — METFORMIN HCL ER 500 MG PO TB24
1000.0000 mg | ORAL_TABLET | Freq: Every day | ORAL | Status: DC
  Administered 2014-08-19: 1000 mg via ORAL
  Filled 2014-08-18 (×2): qty 2

## 2014-08-18 MED ORDER — STROKE: EARLY STAGES OF RECOVERY BOOK
Freq: Once | Status: AC
  Administered 2014-08-19: 01:00:00
  Filled 2014-08-18: qty 1

## 2014-08-18 MED ORDER — GLIPIZIDE ER 10 MG PO TB24
10.0000 mg | ORAL_TABLET | Freq: Every day | ORAL | Status: DC
  Administered 2014-08-19: 10 mg via ORAL
  Filled 2014-08-18 (×2): qty 1

## 2014-08-18 NOTE — ED Notes (Signed)
PER EMS: pt from home, LSN 0000. Fell twice today. Laid on ground for 1+hour. Has a left sided lean, no motor deficits/facial droop. Denies any LOC or N/V. Family reports slurred speech this AM. Has not had any medications x 1 month. BP = 202/120, HR ~60. CBG = 268. Has a hx of cardiac arrhythmia.

## 2014-08-18 NOTE — Consult Note (Signed)
Stroke Consult    Chief Complaint: gait instability HPI: Juan [name] is an 64 y.o. male with hx of HTN, HLD, DM, paroxysmal A fib presenting with falls x 2 and gait instability. Symptoms started around 10am this morning when patient fell into a ditch. At that time noted to have left leg weakness and gait instability. Symptoms persisted and he had another fall so family brought him to the ED.   CT head imaging reviewed, shows right frontal evolving infarct and question of small punctate infarct in the high right frontal lobe.   Unclear A fib history, notes document it was paroxysmal and post procedure, current EKG and tele shows irregular rhythm but does not appear to be A fib.  Date last known well: 08/18/2014 Time last known well: 1000 tPA Given: no, outside IV tPA window  Past Medical History  Diagnosis Date  . Hypertension   . Gout   . Obesity   . Hyperlipidemia   . Diabetes mellitus     type II  . Paroxysmal atrial fibrillation     postoperatively  . DIABETES MELLITUS, TYPE II 07/27/2007  . GOUT 12/01/2006  . HYPERLIPIDEMIA 01/19/2007  . HYPERTENSION 12/01/2006  . 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 OBESITY, HX OF 12/01/2006  . Cataract   . Hx of colonic polyps 05/30/2008    Past Surgical History  Procedure Laterality Date  . Cholecystectomy      Laparoscopic  . Cataract extraction      bilateral  . Colonoscopy      Family History  Problem Relation Age of Onset  . Heart attack Father   . Coronary artery disease Father   . Colon cancer Neg Hx    Social History:  reports that he has been smoking Pipe.  He has never used smokeless tobacco. He reports that he drinks alcohol. He reports that he does not use illicit drugs.  Allergies: No Known Allergies   (Not in a hospital admission)  ROS: Out of a complete 14 system review, the patient complains of only the following symptoms, and all other reviewed systems are negative. +gait instability  Physical  Examination: Filed Vitals:   08/18/14 2030  BP: 213/98  Pulse:   Temp:   Resp: 19   Physical Exam  Constitutional: He appears well-developed and well-nourished.  Psych: Affect appropriate to situation Eyes: No scleral injection HENT: No OP obstrucion Head: Normocephalic.  Cardiovascular: Normal rate and regular rhythm.  Respiratory: Effort normal and breath sounds normal.  GI: Soft. Bowel sounds are normal. No distension. There is no tenderness.  Skin: WDI  Neurologic Examination: Mental Status: Alert, oriented, thought content appropriate.  Speech fluent without evidence of aphasia.  Able to follow 3 step commands without difficulty. Cranial Nerves: II: funduscopic exam wnl bilaterally, visual fields grossly normal, pupils equal, round, reactive to light and accommodation III,IV, VI: ptosis not present, extra-ocular motions intact bilaterally V,VII: smile symmetric, facial light touch sensation normal bilaterally VIII: hearing normal bilaterally IX,X: gag reflex present XI: trapezius strength/neck flexion strength normal bilaterally XII: tongue strength normal  Motor: Right : Upper extremity    Left:     Upper extremity 5/5 deltoid       5-/5 deltoid 5/5 biceps      5/5 biceps  5/5 triceps      5/5 triceps 5/5 hand grip      5/5 hand grip  Lower extremity     Lower extremity 5-/5 hip flexor      4+/5  hip flexor 5-/5 quadricep      4+/5 quadriceps  5/5 hamstrings     5-/5 hamstrings 5/5 plantar flexion       5/5 plantar flexion 5/5 plantar extension     5/5 plantar extension Tone and bulk:normal tone throughout; no atrophy noted Sensory: Pinprick and light touch intact throughout, bilaterally Deep Tendon Reflexes: 2+ and symmetric throughout Plantars: Right: downgoing   Left: downgoing Cerebellar: normal finger-to-nose, and normal heel-to-shin test Gait: deferred  Laboratory Studies:   Basic Metabolic Panel:  Recent Labs Lab 08/18/14 2026 08/18/14 2056   NA 136 139  K 3.4* 3.5  CL 99* 99*  CO2 28  --   GLUCOSE 264* 261*  BUN 8 8  CREATININE 1.03 0.90  CALCIUM 9.2  --     Liver Function Tests:  Recent Labs Lab 08/18/14 2026  AST 19  ALT 13*  ALKPHOS 75  BILITOT 0.9  PROT 7.1  ALBUMIN 3.4*   No results for input(s): LIPASE, AMYLASE in the last 168 hours. No results for input(s): AMMONIA in the last 168 hours.  CBC:  Recent Labs Lab 08/18/14 2026 08/18/14 2056  WBC 9.1  --   NEUTROABS 7.0  --   HGB 16.2 16.7  HCT 46.0 49.0  MCV 89.1  --   PLT 243  --     Cardiac Enzymes:  Recent Labs Lab 08/18/14 2026  CKTOTAL 46*    BNP: Invalid input(s): POCBNP  CBG: No results for input(s): GLUCAP in the last 168 hours.  Microbiology: Results for orders placed or performed during the hospital encounter of 02/11/10  MRSA PCR Screening     Status: None   Collection Time: 02/13/10  8:27 PM  Result Value Ref Range Status   MRSA by PCR  NEGATIVE Final    NEGATIVE        The GeneXpert MRSA Assay (FDA approved for NASAL specimens only), is one component of a comprehensive MRSA colonization surveillance program. It is not intended to diagnose MRSA infection nor to guide or monitor treatment for MRSA infections.    Coagulation Studies:  Recent Labs  08/18/14 2026  LABPROT 14.5  INR 1.11    Urinalysis: No results for input(s): COLORURINE, LABSPEC, PHURINE, GLUCOSEU, HGBUR, BILIRUBINUR, KETONESUR, PROTEINUR, UROBILINOGEN, NITRITE, LEUKOCYTESUR in the last 168 hours.  Invalid input(s): APPERANCEUR  Lipid Panel:     Component Value Date/Time   CHOL 255* 06/03/2014 1503   TRIG 241.0* 06/03/2014 1503   HDL 42.30 06/03/2014 1503   CHOLHDL 6 06/03/2014 1503   VLDL 48.2* 06/03/2014 1503   LDLCALC 178* 12/04/2013 1630    HgbA1C:  Lab Results  Component Value Date   HGBA1C 9.2* 06/03/2014    Urine Drug Screen:  No results found for: LABOPIA, COCAINSCRNUR, LABBENZ, AMPHETMU, THCU, LABBARB  Alcohol  Level:  Recent Labs Lab 08/18/14 2026  [place] <5    Other results:  Imaging: Dg Chest 2 View  08/18/2014   CLINICAL DATA:  [place] twice today. Late on ground for 1 hour. Leans to the left. Slurred speech this morning. Elevated blood pressure.  EXAM: CHEST  2 VIEW  COMPARISON:  None.  FINDINGS: Shallow inspiration. The heart size and mediastinal contours are within normal limits. Both lungs are clear. 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  IMPRESSION: No active cardiopulmonary disease.   Electronically Signed   By: Lucienne Capers M.D.   On: 08/18/2014 21:08   Ct Head Wo Contrast  08/18/2014   CLINICAL DATA:  Status post fall. Laid on ground for more than 1 hour. Left-sided lean and slurred speech. Initial encounter.  EXAM: CT HEAD WITHOUT CONTRAST  TECHNIQUE: Contiguous axial images were obtained from the base of the skull through the vertex without intravenous contrast.  COMPARISON:  None.  FINDINGS: There appears to be an evolving small acute infarct at the high medial right frontal lobe. There is no evidence of hemorrhagic transformation. A small adjacent punctate infarct is noted at the high right frontal lobe, possibly reflecting an embolic event. No mass lesion is seen. There is no evidence of midline shift.  Cerebellar atrophy is noted. Mild periventricular white matter change likely reflects small vessel ischemic microangiopathy. A small chronic lacunar infarct is noted at the right basal ganglia.  The brainstem and fourth ventricle are within normal limits. The third and lateral ventricles are unremarkable in appearance. The cerebral hemispheres are symmetric in appearance, with normal gray-white differentiation.  There is no evidence of fracture; visualized osseous structures are unremarkable in appearance. The orbits are within normal limits. The paranasal sinuses and mastoid air cells are well-aerated. No significant soft tissue  abnormalities are seen.  IMPRESSION: 1. 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 Small adjacent punctate infarct also seen at the high right frontal lobe, possibly reflecting an underlying embolic event. No evidence of hemorrhagic transformation at this time. 2. Cerebellar atrophy noted. 3. Mild small vessel ischemic microangiopathy. 4. Small chronic lacunar infarct at the right basal ganglia.  These results were called by telephone at the time of interpretation on 08/18/2014 at 9:08 pm to [facility] PA, who verbally acknowledged these results.   Electronically Signed   By: Garald Balding M.D.   On: 08/18/2014 21:08    Assessment: 64 y.o. male hx of HTN, DM, HLD, question of paroxysmal A fib (post operative) presenting with left sided weakness and gait instability. CT head imaging shows acute infarct with question of embolic etiology.    Plan: 1. HgbA1c, fasting lipid panel 2. MRI, MRA  of the brain without contrast 3. PT consult, OT consult, Speech consult 4. Echocardiogram. May consider TEE and/or loop recorder 5. Carotid dopplers 6. Prophylactic therapy-ASA 325mg  pending workup for possible A fib 7. Risk factor modification 8. Telemetry monitoring 9. Frequent neuro checks 10. NPO until RN stroke swallow screen   Jim Like, DO [facility] [phone]  If 7pm- 7am, please page neurology on call as listed in [place]. 08/18/2014, 9:33 PM

## 2014-08-18 NOTE — H&P (Signed)
[facility] History and Physical  [name]:[id number] DOB: 10/07/1950 DOA: 08/18/2014  Referring physician: EDP PCP: Cathlean Cower, MD   Chief Complaint: LLE weakness   HPI: Juan [name] is a 64 y.o. male who presents to the ED with LLE weakness and fall x2 today.  First time was this morning around 10am, he fell over in a ditch next to the road.  Thankfully the garbage man came by shortly thereafter and was able to help the patient back to his house.  Had left leg weakness and gait instability since then.  [place] a second time at home and was unable to get up until son came home and found him on the floor.  Patient brought in to ED.  Patient has h/o HLD, HTN, DM2.  Has been off of all meds for last 2 months apparently.  Review of Systems: Systems reviewed.  As above, otherwise negative  Past Medical History  Diagnosis Date  . Hypertension   . Gout   . Obesity   . Hyperlipidemia   . Diabetes mellitus     type II  . Paroxysmal atrial fibrillation     postoperatively  . DIABETES MELLITUS, TYPE II 07/27/2007  . GOUT 12/01/2006  . HYPERLIPIDEMIA 01/19/2007  . HYPERTENSION 12/01/2006  . 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 OBESITY, HX OF 12/01/2006  . Cataract   . Hx of colonic polyps 05/30/2008   Past Surgical History  Procedure Laterality Date  . Cholecystectomy      Laparoscopic  . Cataract extraction      bilateral  . Colonoscopy     Social History:  reports that he has been smoking Pipe.  He has never used smokeless tobacco. He reports that he drinks alcohol. He reports that he does not use illicit drugs.  No Known Allergies  Family History  Problem Relation Age of Onset  . Heart attack Father   . Coronary artery disease Father   . Colon cancer Neg Hx      Prior to Admission medications   Medication Sig Start Date End Date Taking? Authorizing Provider  allopurinol (ZYLOPRIM) 100 MG tablet Take 1 tablet (100 mg total) by mouth daily. 12/04/13   Yes Biagio Borg, MD  aspirin EC 81 MG tablet Take 1 tablet (81 mg total) by mouth daily. Patient not taking: Reported on 08/18/2014 12/04/13   Biagio Borg, MD  atorvastatin (LIPITOR) 80 MG tablet Take 1 tablet (80 mg total) by mouth daily. Patient not taking: Reported on 08/18/2014 06/03/14   Biagio Borg, MD  glipiZIDE (GLUCOTROL XL) 10 MG 24 hr tablet Take 1 tablet (10 mg total) by mouth daily with breakfast. Patient not taking: Reported on 08/18/2014 06/03/14   Biagio Borg, MD  glucose blood (ONE [facility] TEST STRIPS) test strip Use as directed 1 per day 250.02 02/27/12   Biagio Borg, MD  lisinopril-hydrochlorothiazide (PRINZIDE,ZESTORETIC) 20-12.5 MG per tablet Take 1 tablet by mouth daily. Patient not taking: Reported on 08/18/2014 12/04/13   Biagio Borg, MD  metFORMIN (GLUCOPHAGE-XR) 500 MG 24 hr tablet 2 tabs by mouth in the AM Patient not taking: Reported on 08/18/2014 12/04/13   Biagio Borg, MD  zolpidem (AMBIEN) 5 MG tablet Take 1 tablet (5 mg total) by mouth at bedtime as needed for sleep. Patient not taking: Reported on 08/18/2014 06/03/14   Biagio Borg, MD   Physical Exam: Filed Vitals:   08/18/14 2136  BP:   Pulse:  Temp: 98 F (36.7 C)  Resp:     BP 212/123 mmHg  Pulse 103  Temp(Src) 98 F (36.7 C) (Oral)  Resp 24  SpO2 99%  General Appearance:    Alert, oriented, no distress, appears stated age  Head:    Normocephalic, atraumatic  Eyes:    PERRL, EOMI, sclera non-icteric        Nose:   Nares without drainage or epistaxis. Mucosa, turbinates normal  Throat:   Moist mucous membranes. Oropharynx without erythema or exudate.  Neck:   Supple. No carotid bruits.  No thyromegaly.  No lymphadenopathy.   Back:     No CVA tenderness, no spinal tenderness  Lungs:     Clear to auscultation bilaterally, without wheezes, rhonchi or rales  Chest wall:    No tenderness to palpitation  Heart:    Irregularly irregular without murmurs, gallops, rubs  Abdomen:     Soft, non-tender,  nondistended, normal bowel sounds, no organomegaly  Genitalia:    deferred  Rectal:    deferred  Extremities:   No clubbing, cyanosis or edema.  Pulses:   2+ and symmetric all extremities  Skin:   Skin color, texture, turgor normal, no rashes or lesions  Lymph nodes:   Cervical, supraclavicular, and axillary nodes normal  Neurologic:   LLE hip flexor is weak compared to the R, 4/5 strength.    Labs on Admission:  Basic Metabolic Panel:  Recent Labs Lab 08/18/14 2026 08/18/14 2056  NA 136 139  K 3.4* 3.5  CL 99* 99*  CO2 28  --   GLUCOSE 264* 261*  BUN 8 8  CREATININE 1.03 0.90  CALCIUM 9.2  --    Liver Function Tests:  Recent Labs Lab 08/18/14 2026  AST 19  ALT 13*  ALKPHOS 75  BILITOT 0.9  PROT 7.1  ALBUMIN 3.4*   No results for input(s): LIPASE, AMYLASE in the last 168 hours. No results for input(s): AMMONIA in the last 168 hours. CBC:  Recent Labs Lab 08/18/14 2026 08/18/14 2056  WBC 9.1  --   NEUTROABS 7.0  --   HGB 16.2 16.7  HCT 46.0 49.0  MCV 89.1  --   PLT 243  --    Cardiac Enzymes:  Recent Labs Lab 08/18/14 2026  CKTOTAL 46*    BNP (last 3 results) No results for input(s): PROBNP in the last 8760 hours. CBG: No results for input(s): GLUCAP in the last 168 hours.  Radiological Exams on Admission: Dg Chest 2 View  08/18/2014   CLINICAL DATA:  [place] twice today. Late on ground for 1 hour. Leans to the left. Slurred speech this morning. Elevated blood pressure.  EXAM: CHEST  2 VIEW  COMPARISON:  None.  FINDINGS: Shallow inspiration. The heart size and mediastinal contours are within normal limits. Both lungs are clear. 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  IMPRESSION: No active cardiopulmonary disease.   Electronically Signed   By: Lucienne Capers M.D.   On: 08/18/2014 21:08   Ct Head Wo Contrast  08/18/2014   CLINICAL DATA:  Status post fall. Laid on ground for more than 1  hour. Left-sided lean and slurred speech. Initial encounter.  EXAM: CT HEAD WITHOUT CONTRAST  TECHNIQUE: Contiguous axial images were obtained from the base of the skull through the vertex without intravenous contrast.  COMPARISON:  None.  FINDINGS: There appears to be an evolving small acute infarct at the high medial right frontal  lobe. There is no evidence of hemorrhagic transformation. A small adjacent punctate infarct is noted at the high right frontal lobe, possibly reflecting an embolic event. No mass lesion is seen. There is no evidence of midline shift.  Cerebellar atrophy is noted. Mild periventricular white matter change likely reflects small vessel ischemic microangiopathy. A small chronic lacunar infarct is noted at the right basal ganglia.  The brainstem and fourth ventricle are within normal limits. The third and lateral ventricles are unremarkable in appearance. The cerebral hemispheres are symmetric in appearance, with normal gray-white differentiation.  There is no evidence of fracture; visualized osseous structures are unremarkable in appearance. The orbits are within normal limits. The paranasal sinuses and mastoid air cells are well-aerated. No significant soft tissue abnormalities are seen.  IMPRESSION: 1. 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 Small adjacent punctate infarct also seen at the high right frontal lobe, possibly reflecting an underlying embolic event. No evidence of hemorrhagic transformation at this time. 2. Cerebellar atrophy noted. 3. Mild small vessel ischemic microangiopathy. 4. Small chronic lacunar infarct at the right basal ganglia.  These results were called by telephone at the time of interpretation on 08/18/2014 at 9:08 pm to [facility] PA, who verbally acknowledged these results.   Electronically Signed   By: Garald Balding M.D.   On: 08/18/2014 21:08    EKG: Independently reviewed.  Assessment/Plan Principal Problem:   Acute ischemic  stroke Active Problems:   Diabetes   Hyperlipidemia   Essential hypertension   Atrial fibrillation   1. Acute ischemic stroke - 1. Stroke pathway 2. MRI brain 3. A1C was last done in march and is on the high side 4. Lipid profile last done in march 5. SW consult regarding how to care for wife who has dementia 6. ASA 325 for now 7. PT/OT/SLP ordered 8. MRI/MRA pending 2. DM2 -  1. resuming home meds which he has been off of for the past 2 months 2. CBG checks AC/HS 3. A1C was 9.2 in March 3. HLD - 1. Resume home statin which he hasnt taken in past 2 months 4. HTN - 1. PRN hydralazine for now 2. Will continue to hold his lisinopril-HCTZ which he hasnt taken for past 2 months anyhow 5. H/O A.Fib - 1. Had this one time post op in 2012, despite his Irregularly, irregular rhythm today, does not appear to have A.Fib on monitor (has P waves) 2. Would need further evidence of either A.Fib or embolic source before we would start coumadin 3. May well end up with loop-recorder if stroke appears embolic / cryptogenic    Code Status: Full  Family Communication: Son at bedside Disposition Plan: Admit to inpatient   Time spent: 52 min  GARDNER, JARED M. [facility] Pager [phone]  If 7AM-7PM, please contact the day team taking care of the patient [email or web address] Password [facility] 08/18/2014, 9:56 PM

## 2014-08-18 NOTE — ED Provider Notes (Signed)
CSN: [id number]     Arrival date & time 08/18/14  1951 History   First MD Initiated Contact with Patient 08/18/14 1956     Chief Complaint  Patient presents with  . Fall     (Consider location/radiation/quality/duration/timing/severity/associated sxs/prior Treatment) The history is provided by the patient.     Pt with hx HTN (untreated x 3 months), HLD, DM presents with left sided weakness that began around 8:30 this morning when pt states he was taking out the garbage and accidentally stepped in a wheel rut in the yard and fell to the ground.  States he was unable to get up from that point on because his left arm and left leg were weak.  He was on the ground for about 1 hour before the garbage collector and a neighbor helped him up.  Since that point he has had continued weakness and has been "dragging (my) leg like a zombie".  Pt denies hitting head, LOC, any pain throughout the pain, numbness or tingling of the extremities.  Denies CP, SOB, nausea, lightheadedness at any point today.  Denies confusion.  Pt does admit to chronic HTN but states he has not taken his medication for approximately 3 months.    Son reports that when he got home around 6:30pm today he found the patient on the floor in the house complaining of left-sided weakness.  States while speaking to him his father was responding to questions unusually, either with bizarre answers or with yes/no answers to questions that required a different type of response.  The concerned the son and he then called EMS.    Past Medical History  Diagnosis Date  . Hypertension   . Gout   . Obesity   . Hyperlipidemia   . Diabetes mellitus     type II  . Paroxysmal atrial fibrillation     postoperatively  . DIABETES MELLITUS, TYPE II 07/27/2007  . GOUT 12/01/2006  . HYPERLIPIDEMIA 01/19/2007  . HYPERTENSION 12/01/2006  . 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 OBESITY, HX OF 12/01/2006  . Cataract   . Hx of colonic polyps 05/30/2008   Past  Surgical History  Procedure Laterality Date  . Cholecystectomy      Laparoscopic  . Cataract extraction      bilateral  . Colonoscopy     Family History  Problem Relation Age of Onset  . Heart attack Father   . Coronary artery disease Father   . Colon cancer Neg Hx    History  Substance Use Topics  . Smoking status: Current Every Day Smoker    Types: Pipe  . Smokeless tobacco: Never Used     Comment: occasional  . Alcohol Use: 0.0 oz/week    0 Standard drinks or equivalent per week     Comment: 2 drinks a day-beer    Review of Systems  All other systems reviewed and are negative.     Allergies  Review of patient's allergies indicates no known allergies.  Home Medications   Prior to Admission medications   Medication Sig Start Date End Date Taking? Authorizing Provider  allopurinol (ZYLOPRIM) 100 MG tablet Take 1 tablet (100 mg total) by mouth daily. 12/04/13   Biagio Borg, MD  aspirin EC 81 MG tablet Take 1 tablet (81 mg total) by mouth daily. 12/04/13   Biagio Borg, MD  atorvastatin (LIPITOR) 80 MG tablet Take 1 tablet (80 mg total) by mouth daily. 06/03/14   Biagio Borg, MD  glipiZIDE (GLUCOTROL  XL) 10 MG 24 hr tablet Take 1 tablet (10 mg total) by mouth daily with breakfast. 06/03/14   Biagio Borg, MD  glucose blood (ONE TOUCH TEST STRIPS) test strip Use as directed 1 per day 250.02 02/27/12   Biagio Borg, MD  lisinopril-hydrochlorothiazide (PRINZIDE,ZESTORETIC) 20-12.5 MG per tablet Take 1 tablet by mouth daily. 12/04/13   Biagio Borg, MD  metFORMIN (GLUCOPHAGE-XR) 500 MG 24 hr tablet 2 tabs by mouth in the AM 12/04/13   Biagio Borg, MD  zolpidem (AMBIEN) 5 MG tablet Take 1 tablet (5 mg total) by mouth at bedtime as needed for sleep. 06/03/14   Biagio Borg, MD   BP 190/117 mmHg  Pulse 83  Temp(Src) 98.8 F (37.1 C) (Oral)  Resp 22  SpO2 98% Physical Exam  Constitutional: He appears well-developed and well-nourished. No distress.  HENT:  Head: Normocephalic  and atraumatic.  Neck: Neck supple.  Cardiovascular: Normal rate.  An irregular rhythm present.  Pulmonary/Chest: Effort normal and breath sounds normal. No respiratory distress. He has no wheezes. He has no rales.  Abdominal: Soft. He exhibits no distension and no mass. There is no tenderness. There is no rebound and no guarding.  Neurological: He is alert. He exhibits normal muscle tone. GCS eye subscore is 4. GCS verbal subscore is 5. GCS motor subscore is 6.  CN II-XII intact, EOMs intact, no pronator drift, grip strengths equal bilaterally; strength 5/5 in all extremities, sensation intact in all extremities; finger to nose, heel to shin, rapid alternating movements normal.  Skin: He is not diaphoretic.  Nursing note and vitals reviewed.   ED Course  Procedures (including critical care time) Labs Review Labs Reviewed  DIFFERENTIAL - Abnormal; Notable for the following:    Neutrophils Relative % 78 (*)    All other components within normal limits  COMPREHENSIVE METABOLIC PANEL - Abnormal; Notable for the following:    Potassium 3.4 (*)    Chloride 99 (*)    Glucose, Bld 264 (*)    Albumin 3.4 (*)    ALT 13 (*)    All other components within normal limits  CK - Abnormal; Notable for the following:    Total CK 46 (*)    All other components within normal limits  I-STAT CHEM 8, ED - Abnormal; Notable for the following:    Chloride 99 (*)    Glucose, Bld 261 (*)    All other components within normal limits  ETHANOL  PROTIME-INR  APTT  CBC  URINE RAPID DRUG SCREEN, HOSP PERFORMED  URINALYSIS, ROUTINE W REFLEX MICROSCOPIC (NOT AT [facility])  Randolm Idol, ED    Imaging Review Dg Chest 2 View  08/18/2014   CLINICAL DATA:  [place] twice today. Late on ground for 1 hour. Leans to the left. Slurred speech this morning. Elevated blood pressure.  EXAM: CHEST  2 VIEW  COMPARISON:  None.  FINDINGS: Shallow inspiration. The heart size and mediastinal contours are within normal limits.  Both lungs are clear. 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  IMPRESSION: No active cardiopulmonary disease.   Electronically Signed   By: Lucienne Capers M.D.   On: 08/18/2014 21:08   Ct Head Wo Contrast  08/18/2014   CLINICAL DATA:  Status post fall. Laid on ground for more than 1 hour. Left-sided lean and slurred speech. Initial encounter.  EXAM: CT HEAD WITHOUT CONTRAST  TECHNIQUE: Contiguous axial images were obtained from the base of  the skull through the vertex without intravenous contrast.  COMPARISON:  None.  FINDINGS: There appears to be an evolving small acute infarct at the high medial right frontal lobe. There is no evidence of hemorrhagic transformation. A small adjacent punctate infarct is noted at the high right frontal lobe, possibly reflecting an embolic event. No mass lesion is seen. There is no evidence of midline shift.  Cerebellar atrophy is noted. Mild periventricular white matter change likely reflects small vessel ischemic microangiopathy. A small chronic lacunar infarct is noted at the right basal ganglia.  The brainstem and fourth ventricle are within normal limits. The third and lateral ventricles are unremarkable in appearance. The cerebral hemispheres are symmetric in appearance, with normal gray-white differentiation.  There is no evidence of fracture; visualized osseous structures are unremarkable in appearance. The orbits are within normal limits. The paranasal sinuses and mastoid air cells are well-aerated. No significant soft tissue abnormalities are seen.  IMPRESSION: 1. 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 Small adjacent punctate infarct also seen at the high right frontal lobe, possibly reflecting an underlying embolic event. No evidence of hemorrhagic transformation at this time. 2. Cerebellar atrophy noted. 3. Mild small vessel ischemic microangiopathy. 4. Small chronic lacunar  infarct at the right basal ganglia.  These results were called by telephone at the time of interpretation on 08/18/2014 at 9:08 pm to [facility] PA, who verbally acknowledged these results.   Electronically Signed   By: Garald Balding M.D.   On: 08/18/2014 21:08     EKG Interpretation None       ED ECG REPORT   Date: 08/18/2014  Rate: 83  Rhythm: normal sinus rhythm  QRS Axis: normal  Intervals: normal  ST/T Wave abnormalities: nonspecific ST/T changes  Conduction Disutrbances:right bundle branch block  Narrative Interpretation:   Old EKG Reviewed: changes noted   8:31 PM Discussed pt with Dr Doy Mince who will also see the patient.  Reviewed EKG with Dr Doy Mince.   MDM   Final diagnoses:  Stroke  Abnormal EKG  Right bundle branch block    Pt with hx HTN, DM, HLD, paroxysmal afib on daily aspirin only p/w left sided weakness, abnormal mental functioning since 8:30am today.  Found to have evolving acute infarct on CT.  Pt does have new RBBB on EKG but denies any cardiac/chest symptoms.  Denies LOC.  Discussed at length with Dr Alcario Drought, [facility]. Dr Alcario Drought spoke with Dr Janann Colonel, neurohospitalist, who will also see the patient in consult. Pt and patient's son updated regarding CT results and admission, all questions answered.      Clayton Bibles, PA-C 08/18/14 2344  Serita Grit, MD 08/19/14 [phone]

## 2014-08-19 ENCOUNTER — Inpatient Hospital Stay (HOSPITAL_COMMUNITY): Payer: Medicare Other

## 2014-08-19 DIAGNOSIS — E785 Hyperlipidemia, unspecified: Secondary | ICD-10-CM

## 2014-08-19 DIAGNOSIS — I6789 Other cerebrovascular disease: Secondary | ICD-10-CM

## 2014-08-19 LAB — GLUCOSE, CAPILLARY
GLUCOSE-CAPILLARY: 258 mg/dL — AB (ref 65–99)
Glucose-Capillary: 195 mg/dL — ABNORMAL HIGH (ref 65–99)
Glucose-Capillary: 153 mg/dL — ABNORMAL HIGH (ref 65–99)
Glucose-Capillary: 155 mg/dL — ABNORMAL HIGH (ref 65–99)

## 2014-08-19 LAB — BASIC METABOLIC PANEL
Anion gap: 11 (ref 5–15)
BUN: 5 mg/dL — AB (ref 6–20)
CALCIUM: 8.9 mg/dL (ref 8.9–10.3)
CO2: 24 mmol/L (ref 22–32)
CREATININE: 0.95 mg/dL (ref 0.61–1.24)
Chloride: 99 mmol/L — ABNORMAL LOW (ref 101–111)
GFR calc Af Amer: >60 mL/min (ref >60)
GLUCOSE: 174 mg/dL — AB (ref 65–99)
Potassium: 3 mmol/L — ABNORMAL LOW (ref 3.5–5.1)
Sodium: 134 mmol/L — ABNORMAL LOW (ref 135–145)

## 2014-08-19 LAB — URINE MICROSCOPIC-ADD ON

## 2014-08-19 LAB — URINALYSIS, ROUTINE W REFLEX MICROSCOPIC
BILIRUBIN URINE: NEGATIVE
Glucose, UA: >1000 mg/dL — AB
HGB URINE DIPSTICK: NEGATIVE
KETONES UR: 15 mg/dL — AB
Leukocytes, UA: NEGATIVE
Nitrite: NEGATIVE
PROTEIN: NEGATIVE mg/dL
Specific Gravity, Urine: 1.02 (ref 1.005–1.030)
Urobilinogen, UA: 0.2 mg/dL (ref 0.0–1.0)
pH: 6.5 (ref 5.0–8.0)

## 2014-08-19 LAB — RAPID URINE DRUG SCREEN, HOSP PERFORMED
AMPHETAMINES: NOT DETECTED
Barbiturates: NOT DETECTED
Benzodiazepines: NOT DETECTED
Cocaine: NOT DETECTED
Opiates: NOT DETECTED
TETRAHYDROCANNABINOL: NOT DETECTED

## 2014-08-19 MED ORDER — POTASSIUM CHLORIDE 10 MEQ/100ML IV SOLN
10.0000 meq | INTRAVENOUS | Status: AC
  Administered 2014-08-19 (×2): 10 meq via INTRAVENOUS
  Filled 2014-08-19 (×2): qty 100

## 2014-08-19 MED ORDER — INSULIN GLARGINE 100 UNIT/ML ~~LOC~~ SOLN
10.0000 [IU] | Freq: Every day | SUBCUTANEOUS | Status: DC
  Administered 2014-08-19 – 2014-08-20 (×2): 10 [IU] via SUBCUTANEOUS
  Filled 2014-08-19 (×3): qty 0.1

## 2014-08-19 MED ORDER — LISINOPRIL 20 MG PO TABS
20.0000 mg | ORAL_TABLET | Freq: Every day | ORAL | Status: DC
  Administered 2014-08-19 – 2014-08-21 (×3): 20 mg via ORAL
  Filled 2014-08-19 (×3): qty 1

## 2014-08-19 MED ORDER — INSULIN ASPART 100 UNIT/ML ~~LOC~~ SOLN: 0.0000 [IU] | Freq: Every day | SUBCUTANEOUS | Status: DC

## 2014-08-19 MED ORDER — METOPROLOL TARTRATE 25 MG PO TABS
25.0000 mg | ORAL_TABLET | Freq: Two times a day (BID) | ORAL | Status: DC
  Administered 2014-08-19 – 2014-08-20 (×3): 25 mg via ORAL
  Filled 2014-08-19 (×6): qty 1

## 2014-08-19 MED ORDER — HYDRALAZINE HCL 20 MG/ML IJ SOLN
10.0000 mg | Freq: Once | INTRAMUSCULAR | Status: AC
Start: 2014-08-19 — End: 2014-08-19
  Administered 2014-08-19: 10 mg via INTRAVENOUS
  Filled 2014-08-19: qty 1

## 2014-08-19 MED ORDER — INSULIN ASPART 100 UNIT/ML ~~LOC~~ SOLN
0.0000 [IU] | Freq: Three times a day (TID) | SUBCUTANEOUS | Status: DC
  Administered 2014-08-19: 3 [IU] via SUBCUTANEOUS
  Administered 2014-08-20 – 2014-08-21 (×4): 2 [IU] via SUBCUTANEOUS

## 2014-08-19 MED ORDER — MAGNESIUM SULFATE 2 GM/50ML IV SOLN
2.0000 g | Freq: Once | INTRAVENOUS | Status: AC
  Administered 2014-08-19: 2 g via INTRAVENOUS
  Filled 2014-08-19: qty 50

## 2014-08-19 MED ORDER — POTASSIUM CHLORIDE CRYS ER 20 MEQ PO TBCR
40.0000 meq | EXTENDED_RELEASE_TABLET | Freq: Once | ORAL | Status: AC
  Administered 2014-08-19: 40 meq via ORAL
  Filled 2014-08-19: qty 2

## 2014-08-19 NOTE — Evaluation (Signed)
Physical Therapy Evaluation Patient Details Name: Juan [name] MRN: [id number] DOB: [birth year] Today's Date: 08/19/2014   History of Present Illness  Pt adm with lt sided weakness and found to have rt frontal CVA.  Clinical Impression  Pt admitted with above diagnosis and presents to PT with functional limitations due to deficits listed below (See PT problem list). Pt needs skilled PT to maximize independence and safety to allow discharge to CIR. Pt with significant cognitive issues as well as weakness and needs CIR.     Follow Up Recommendations CIR    Equipment Recommendations   (to be assessed)    Recommendations for Other Services Rehab consult     Precautions / Restrictions Precautions Precautions: Fall Restrictions Weight Bearing Restrictions: No      Mobility  Bed Mobility Overal bed mobility: Needs Assistance Bed Mobility: Supine to Sit     Supine to sit: Min assist;HOB elevated     General bed mobility comments: Verbal cues x 3 to begin moving to EOB. Assist to bring trunk up and for balance  Transfers Overall transfer level: Needs assistance Equipment used: Rolling walker (2 wheeled) Transfers: Sit to/from Stand Sit to Stand: Min assist         General transfer comment: Assist to bring hips up and for balance  Ambulation/Gait Ambulation/Gait assistance: Min assist Ambulation Distance (Feet): 15 Feet (x 2) Assistive device: Rolling walker (2 wheeled) Gait Pattern/deviations: Decreased step length - right;Decreased step length - left;Decreased dorsiflexion - left (lt knee flexed in stance) Gait velocity: decr Gait velocity interpretation: Below normal speed for age/gender General Gait Details: Assist for balance and support and verbal cues to stay with walker and not leave it behind.  Stairs            Wheelchair Mobility    Modified Rankin (Stroke Patients Only) Modified Rankin (Stroke Patients Only) Pre-Morbid Rankin Score: No  symptoms Modified Rankin: Moderately severe disability     Balance Overall balance assessment: Needs assistance Sitting-balance support: Bilateral upper extremity supported Sitting balance-Leahy Scale: Poor Sitting balance - Comments: requires upper extremity support. Leans posteriorly with any dynamic activity   Standing balance support: Single extremity supported Standing balance-Leahy Scale: Poor Standing balance comment: requires walker and min A                             Pertinent Vitals/Pain Pain Assessment: No/denies pain    Home Living Family/patient expects to be discharged to:: Private residence Living Arrangements: Spouse/significant other (per pt wife is "invalid" and he cares for her) Available Help at Discharge:  (Pt usually cares for wife.) Type of Home: House       Home Layout: Two level;Bed/bath upstairs Home Equipment: Walker - 2 wheels (wife's. Unsure if she still uses it.) Additional Comments: Currently pt's son is caring for pt's wife    Prior Function Level of Independence: Independent               Hand Dominance   Dominant Hand: Right    Extremity/Trunk Assessment   Upper Extremity Assessment: Defer to OT evaluation RUE Deficits / Details: difficulty with opposition of thumb to 5th digit     LUE Deficits / Details: 3-/5 shoulder flexion   Lower Extremity Assessment: LLE deficits/detail   LLE Deficits / Details: grossly 4-/5     Communication   Communication: No difficulties  Cognition Arousal/Alertness: Awake/alert Behavior During Therapy: Impulsive Overall Cognitive Status: Impaired/Different from  baseline Area of Impairment: Safety/judgement;Problem solving Orientation Level: Disoriented to;Place Current Attention Level: Sustained Memory: Decreased short-term memory;Decreased recall of precautions Following Commands: Follows one step commands inconsistently Safety/Judgement: Decreased awareness of  safety;Decreased awareness of deficits   Problem Solving: Requires verbal cues General Comments: Pt feels like he is almost back to normal and that he wouldn't have any problems getting up by himself and walking despite not being able to maintain dynamic sitting balance on the EOB.    General Comments      Exercises        Assessment/Plan    PT Assessment Patient needs continued PT services  PT Diagnosis Difficulty walking;Abnormality of gait;Hemiplegia non-dominant side   PT Problem List Decreased strength;Decreased activity tolerance;Decreased balance;Decreased mobility;Decreased cognition;Decreased knowledge of use of DME;Decreased safety awareness;Decreased knowledge of precautions  PT Treatment Interventions DME instruction;Gait training;Stair training;Therapeutic activities;Functional mobility training;Therapeutic exercise;Balance training;Neuromuscular re-education;Cognitive remediation;Patient/family education   PT Goals (Current goals can be found in the Care Plan section) Acute Rehab PT Goals Patient Stated Goal: go home PT Goal Formulation: With patient Time For Goal Achievement: 09/02/14 Potential to Achieve Goals: Good    Frequency Min 4X/week   Barriers to discharge Decreased caregiver support wife requires assist    Co-evaluation               End of Session Equipment Utilized During Treatment: Gait belt Activity Tolerance: Patient tolerated treatment well Patient left: in chair;with call bell/phone within reach;with chair alarm set Nurse Communication: Mobility status (poor awareness and do not leave in bathroom unattended.)         Time: 1232-1257 PT Time Calculation (min) (ACUTE ONLY): 25 min   Charges:   PT Evaluation $Initial PT Evaluation Tier I: 1 Procedure PT Treatments $Gait Training: 8-22 mins   PT G Codes:        Skarlet Lyons 09-08-14, 2:33 PM  [facility] PT [phone]
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
## 2014-08-19 NOTE — Progress Notes (Signed)
Utilization Review completed. Xaviar Lunn RN BSN CM 

## 2014-08-19 NOTE — Evaluation (Signed)
Occupational Therapy Evaluation Patient Details Name: Juan [name] MRN: [id number] DOB: 1950-11-03 Today's Date: 08/19/2014    History of Present Illness Pt admitted with left sided weakness and MRI revealed Acute right ACA territory infarct involving the parasagittal right frontal lobe, body of the right corpus callosum, with additional patchy cortical infarcts involving the right frontal and parietal cortex.   Clinical Impression   Pt admitted with above. Pt independent with ADLs, PTA. Feel pt will benefit from acute OT to increase independence prior to d/c. Recommending CIR for rehab.    Follow Up Recommendations  CIR;Supervision/Assistance - 24 hour    Equipment Recommendations  Other (comment) (TBD)    Recommendations for Other Services Rehab consult     Precautions / Restrictions Precautions Precautions: Fall Restrictions Weight Bearing Restrictions: No      Mobility Bed Mobility      General bed mobility comments: not assessed  Transfers Overall transfer level: Needs assistance Equipment used: Rolling walker (2 wheeled) Transfers: Sit to/from Stand Sit to Stand: Min assist         General transfer comment: cues for hand placement. assist to boost.    Balance Overall balance assessment: History of Falls  Pt not steady with ambulation.                           ADL Overall ADL's : Needs assistance/impaired     Grooming: Wash/dry face;Min guard;Standing           Upper Body Dressing : Set up;Supervision/safety;Sitting   Lower Body Dressing: Moderate assistance;Sit to/from stand   Toilet Transfer: Minimal assistance;Min guard;RW;Ambulation (chair)           Functional mobility during ADLs: Minimal assistance;Rolling walker;Min guard General ADL Comments: Pt at sink and washed face and then he reports he needs to go to the bathroom and was urinating on floor. Pt kept trying to walk in bathroom despite OT telling him to wait.   OT assisted in changing pt's gown.     Vision Pt wears reading glasses and reports no vision changes from baseline; reports past cataract surgery. Vision Assessment?: Yes Visual Fields:  (pt looking around-not accurate test)   Perception     Praxis      Pertinent Vitals/Pain Pain Assessment: No/denies pain     Hand Dominance Right   Extremity/Trunk Assessment Upper Extremity Assessment Upper Extremity Assessment: RUE deficits/detail;LUE deficits/detail RUE Deficits / Details: difficulty with opposition of thumb to 5th digit LUE Deficits / Details: weakness with left shoulder flexion (slightly less than full AROM shoulder flexion), but able to withstand some resistance.   Lower Extremity Assessment Lower Extremity Assessment: Defer to PT evaluation LLE Deficits / Details: grossly 4-/5       Communication Communication Communication: No difficulties   Cognition Arousal/Alertness: Awake/alert Behavior During Therapy: Impulsive;Flat affect Overall Cognitive Status: Impaired/Different from baseline Area of Impairment: Problem solving;Following commands;Safety/judgement;Attention;Orientation Orientation Level: Disoriented to;Place (initially) Current Attention Level: Sustained Memory: Decreased short-term memory Following Commands: Follows one step commands inconsistently Safety/Judgement: Decreased awareness of safety;Decreased awareness of deficits   Problem Solving: Slow processing;Requires verbal cues   Comments: Difficulty problem solving with managing socks.   General Comments       Exercises       Shoulder Instructions      Home Living Family/patient expects to be discharged to:: Private residence Living Arrangements: Spouse/significant other (per pt wife is "invalid" and he cares for her) Available Help at  Discharge:  (pt usually cares for wife) Type of Home: House       Home Layout: Two level;Bed/bath upstairs     Bathroom Shower/Tub: Walk-in  shower   Bathroom Toilet: Handicapped height     Home Equipment: Environmental consultant - 2 wheels;Shower seat;Grab bars - tub/shower (wife's walker-unsure if she still uses it)   Additional Comments: Currently pt's son is caring for pt's wife      Prior Functioning/Environment Level of Independence: Independent             OT Diagnosis: Cognitive deficits;Other (comment) (Hemiparesis non-dominant side)   OT Problem List: Decreased strength;Impaired balance (sitting and/or standing);Decreased safety awareness;Decreased cognition;Decreased knowledge of use of DME or AE;Decreased knowledge of precautions;Decreased range of motion   OT Treatment/Interventions: Self-care/ADL training;DME and/or AE instruction;Therapeutic exercise;Neuromuscular education;Therapeutic activities;Cognitive remediation/compensation;Patient/family education;Balance training;Visual/perceptual remediation/compensation    OT Goals(Current goals can be found in the care plan section) Acute Rehab OT Goals Patient Stated Goal: not stated OT Goal Formulation: With patient Time For Goal Achievement: 08/26/14 Potential to Achieve Goals: Good ADL Goals Pt Will Perform Grooming: with set-up;standing Pt Will Perform Lower Body Bathing: sit to/from stand;with set-up;with supervision Pt Will Perform Lower Body Dressing: with set-up;with supervision;sit to/from stand Pt Will Transfer to Toilet: with supervision;ambulating  OT Frequency: Min 2X/week   Barriers to D/C:            Co-evaluation              End of Session Equipment Utilized During Treatment: Gait belt;Rolling walker Nurse Communication: Other (comment) (cognition; urinated on floor; may need bath)  Activity Tolerance: Patient tolerated treatment well Patient left: in chair;with call bell/phone within reach;with chair alarm set   Time: 1359-1418 OT Time Calculation (min): 19 min Charges:  OT General Charges $OT Visit: 1 Procedure OT  Evaluation $Initial OT Evaluation Tier I: 1 Procedure G-CodesBenito Mccreedy OTR/L [id number] 08/19/2014, 2:41 PM

## 2014-08-19 NOTE — Progress Notes (Signed)
Report received from ED nurse.

## 2014-08-19 NOTE — Progress Notes (Signed)
STROKE TEAM PROGRESS NOTE   HISTORY Juan [name] is an 64 y.o. male with hx of HTN, HLD, DM, paroxysmal A fib presenting with falls x 2 and gait instability. Symptoms started around 10am this morning when patient fell into a ditch. At that time noted to have left leg weakness and gait instability. Symptoms persisted and he had another fall so family brought him to the ED. CT head imaging reviewed, shows right frontal evolving infarct and question of small punctate infarct in the high right frontal lobe. Unclear A fib history, notes document it was paroxysmal and post  Gall bladder procedure, current EKG and tele shows irregular rhythm but does not appear to be A fib. Patient was last known well 08/18/2014 at 1000. Patient was not administered TPA secondary to outside of tPA window. He was admitted for further evaluation and treatment.   SUBJECTIVE (INTERVAL HISTORY) No family is at the bedside.  Overall he feels his condition is stable.    OBJECTIVE Temp:  [98 F (36.7 C)-98.8 F (37.1 C)] 98.1 F (36.7 C) (06/14 0610) Pulse Rate:  [51-126] 65 (06/14 0610) Cardiac Rhythm:  [-] Atrial fibrillation (06/14 0500) Resp:  [18-26] 22 (06/14 0610) BP: (184-218)/(91-180) 184/92 mmHg (06/14 0610) SpO2:  [95 %-99 %] 98 % (06/14 0610) Weight:  [117.255 kg (258 lb 8 oz)] 117.255 kg (258 lb 8 oz) (06/13 2217)   Recent Labs Lab 08/19/14 0759  GLUCAP 195*    Recent Labs Lab 08/18/14 2026 08/18/14 2056  NA 136 139  K 3.4* 3.5  CL 99* 99*  CO2 28  --   GLUCOSE 264* 261*  BUN 8 8  CREATININE 1.03 0.90  CALCIUM 9.2  --     Recent Labs Lab 08/18/14 2026  AST 19  ALT 13*  ALKPHOS 75  BILITOT 0.9  PROT 7.1  ALBUMIN 3.4*    Recent Labs Lab 08/18/14 2026 08/18/14 2056  WBC 9.1  --   NEUTROABS 7.0  --   HGB 16.2 16.7  HCT 46.0 49.0  MCV 89.1  --   PLT 243  --     Recent Labs Lab 08/18/14 2026  CKTOTAL 46*    Recent Labs  08/18/14 2026  LABPROT 14.5  INR 1.11     Recent Labs  08/19/14 0048  COLORURINE AMBER*  LABSPEC 1.020  PHURINE 6.5  GLUCOSEU >1000*  HGBUR NEGATIVE  BILIRUBINUR NEGATIVE  KETONESUR 15*  PROTEINUR NEGATIVE  UROBILINOGEN 0.2  NITRITE NEGATIVE  LEUKOCYTESUR NEGATIVE       Component Value Date/Time   CHOL 255* 06/03/2014 1503   TRIG 241.0* 06/03/2014 1503   HDL 42.30 06/03/2014 1503   CHOLHDL 6 06/03/2014 1503   VLDL 48.2* 06/03/2014 1503   LDLCALC 178* 12/04/2013 1630   Lab Results  Component Value Date   HGBA1C 9.2* 06/03/2014      Component Value Date/Time   LABOPIA NONE DETECTED 08/19/2014 0048   COCAINSCRNUR NONE DETECTED 08/19/2014 0048   LABBENZ NONE DETECTED 08/19/2014 0048   AMPHETMU NONE DETECTED 08/19/2014 0048   THCU NONE DETECTED 08/19/2014 0048   LABBARB NONE DETECTED 08/19/2014 0048     Recent Labs Lab 08/18/14 2026  ETH <5    Dg Chest 2 View 08/18/2014    No active cardiopulmonary disease.     Ct Head Wo Contrast 08/18/2014    1. 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 Small adjacent punctate infarct also seen at the high right frontal lobe,  possibly reflecting an underlying embolic event. No evidence of hemorrhagic transformation at this time. 2. Cerebellar atrophy noted. 3. Mild small vessel ischemic microangiopathy. 4. Small chronic lacunar infarct at the right basal ganglia.    Juan Brain Wo Contrast 08/19/2014    1. Acute right ACA territory infarct involving the parasagittal right frontal lobe, body of the right corpus callosum, with additional patchy cortical infarcts involving the right frontal and parietal cortex. There are scattered foci of associated petechial hemorrhage without frank hemorrhagic conversion. No significant mass effect. 2. Mild age-related generalized cerebral atrophy with chronic small vessel ischemic disease and small remote lacunar infarcts involving the bilateral basal ganglia.  Juan [name] Head/brain Wo Cm 08/19/2014    1. Occlusive  versus near occlusive thrombus within the right A2 segment, compatible with right ACA territory infarct. 2. Short-segment high-grade stenosis within the right M1 segment. 3. Additional multi focal atheromatous irregularity with stenoses throughout the intracranial circulation as detailed above.     2D Echocardiogram  - Left ventricle: The cavity size was normal. Wall thickness wasincreased in a pattern of mild LVH. Systolic function wasvigorous. The estimated ejection fraction was in the range of 65%to 70%. Wall motion was normal; there were no regional wallmotion abnormalities. - Left atrium: The atrium was mildly dilated. Impressions:  No cardiac source of emboli was indentified.   PHYSICAL EXAM Frail middle aged Caucasian male currently not in distress. . Afebrile. Head is nontraumatic. Neck is supple without bruit.    Cardiac exam no murmur or gallop. Lungs are clear to auscultation. Distal pulses are well felt. Neurological Exam :  Awake alert oriented 3 normal speech and language. No aphasia or apraxia dysarthria. Extraocular movements are full range with only a few beats of end gaze nystagmus on lateral gaze bilaterally. Fundi were not visualized. Vision acuity seems adequate. Mild left lower facial asymmetry. Tongue is midline. Motor system exam revealed mild left lower extremity drift. Weakness of left grip, intrinsic hand muscles and left hip flexors and ankle dorsiflexors. Diminished fine finger movements on the left. Orbits right over left approximately. Coordination is slightly impaired on the left. DTrs are 2+ symmetric. Sensation is preserved bilaterally. Plantars are downgoing. Gait was not tested. ASSESSMENT/PLAN Juan [name] is a 64 y.o. male with history of HTN, HLD, DM, paroxysmal A fib presenting with falls x 2 and gait instability. He did not receive IV t-PA due to delay in arrival.   Stroke:  Non-dominant right ACA infarcts, embolic secondary to unknown source,  possible atrial fibrillation given hx of post-chole atrial fibrillation   MRI  Patchy acute R ACA infarcts  MRA  Right A2 occlusion versus thrombus. Right M1 high-grade stenosis. Intracranial atheromatous stenoses throughout.  Carotid Doppler  pending  2D Echo  No source of emboli  LDL pending   HgbA1c pending  Heparin 5000 units sq tid for VTE prophylaxis Diet Heart Room service appropriate?: Yes; Fluid consistency:: Thin  aspirin 81 mg orally every day prior to admission, now on aspirin 325 mg orally every day  Patient counseled to be compliant with his antithrombotic medications  Ongoing aggressive stroke risk factor management  Therapy recommendations:  CIR. Consult requested  Disposition:  pending   Hx paroxsymal atrial fibrillation  Post cholecystectomy in 2012  Follow up Holter with NSR, some AT. Hochrein followed  Dr. Leonie Man called and discussed with Dr. Caryl Comes today, on call for EP. No precedence for anticoagulation in this situation  TEE to look for embolic source. Arranged  with [place] for tomorrow.  If positive for PFO (patent foramen ovale), check bilateral lower extremity venous dopplers to rule out DVT as possible source of stroke. (I have made patient NPO after midnight tonight).   Consider possible loop vs holter monitor. Will decide tomorrow after TEE  Malignant hypertension  Home meds:   lisinopril-hydrochlorothiazide  Unstable. Elevated.  Permissive hypertension (OK if < 220/120) but gradually normalize in 5-7 days   Hyperlipidemia  Home meds:  lipitor 80, resumed in hospital  LDL pending, goal < 70  Continue statin at discharge  Diabetes type II  HgbA1c pending, goal < 7.0  Other Stroke Risk Factors  Advanced age  Pipe smoker, advised to stop smoking  ETOH use  Obesity, Body mass index is 32.83 kg/(m^2).   Hospital day # [place] for Pager  information 08/19/2014 4:48 PM  I have personally examined this patient, reviewed notes, independently viewed imaging studies, participated in medical decision making and plan of care. I have made any additions or clarifications directly to the above note. Agree with note above. He presented with fall due to a new right frontal and ACA branch infarct likely due to cardioembolic embolism . He has remote history of transient atrial fibrillation following gallbladder surgery only.. Patient remains at risk for neurological worsening, recurrent stroke and TIAs. He has not been considered anti-Coagulation candidate in the past due to high fall risk but if TEE shows intracardiac clot may reconsider. He needs ongoing stroke evaluation and aggressive risk factor modification.  Antony Contras, MD Medical Director [facility] Pager: [phone] 08/19/2014 5:28 PM    To contact Stroke Continuity provider, please refer to [email or web address] After hours, contact General Neurology

## 2014-08-19 NOTE — Progress Notes (Signed)
Rehab Admissions Coordinator Note:  Patient was screened by Takisha Pelle L for appropriateness for an Inpatient Acute Rehab Consult.  At this time, we are recommending Inpatient Rehab consult.  Levonne Carreras L 08/19/2014, 2:53 PM  I can be reached at [phone].

## 2014-08-19 NOTE — Progress Notes (Signed)
PROGRESS NOTE  Juan [name] DTO:[id number] DOB: 1950/09/26 DOA: 08/18/2014 PCP: Cathlean Cower, MD  Brief history 64 year old male with a history of hypertension, diabetes mellitus, paroxysmal fibrillation, hypertension, hyperlipidemia presented with left leg weakness resulting in mechanical fall 2 on the day prior to admission. After his second fall, the patient was found by his son on the floor and EMS was activated. Workup in the emergency department including CT of the brain showed an acute right frontal stroke. MRI of the brain confirmed a right ACA infarct. Neurology was consulted and continues to follow the patient. Notably, the patient states that he has not taken any of his medications for the past 2-3 months. He has been under a lot of stress taking care of his wife who has advanced dementia. Assessment/Plan: Acute ischemic stroke -MRI brain shows acute right PCA infarct with a scattered petechial hemorrhages -MRA shows right-A2 and right M1 occlusion -start Lipitor 80 mg daily -Continue aspirin 325 mg daily  -Appreciate neurology follow-up  -Hemoglobin A1c -Lipid panel -Carotid duplex -TEE on 6/15 -PT/OT/ST history of postoperative paroxysmal atrial fibrillation  -TEE scheduled 08/20/2014  -CHADSVASc= 4 -Differ anticoagulation to neurology service  diabetes mellitus type 2, poorly controlled -Patient has been off of his medications for over 2 months  -Repeat hemoglobin A1c  -06/03/2014 hemoglobin A1c 9.2  -Start Lantus 10 units at bedtime  -Start NovoLog sliding scale   -monitor CBGs and adjust accordingly -Discontinue metformin and glipizide for now Hypertension  -Again, patient has been off of his antihypertensives medications for over 2 months  -Restart lisinopril  -Start metoprolol tartrate   hypokalemia -Replete--given 40 milliequivalents today -Given additional 20 IV -Check magnesium -BMP in am  Family Communication:   Son updated at  beside Disposition Plan:   CIR when medically stable       Procedures/Studies: Dg Chest 2 View  08/18/2014   CLINICAL DATA:  [place] twice today. Late on ground for 1 hour. Leans to the left. Slurred speech this morning. Elevated blood pressure.  EXAM: CHEST  2 VIEW  COMPARISON:  None.  FINDINGS: Shallow inspiration. The heart size and mediastinal contours are within normal limits. Both lungs are clear. 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  IMPRESSION: No active cardiopulmonary disease.   Electronically Signed   By: Lucienne Capers M.D.   On: 08/18/2014 21:08   Ct Head Wo Contrast  08/18/2014   CLINICAL DATA:  Status post fall. Laid on ground for more than 1 hour. Left-sided lean and slurred speech. Initial encounter.  EXAM: CT HEAD WITHOUT CONTRAST  TECHNIQUE: Contiguous axial images were obtained from the base of the skull through the vertex without intravenous contrast.  COMPARISON:  None.  FINDINGS: There appears to be an evolving small acute infarct at the high medial right frontal lobe. There is no evidence of hemorrhagic transformation. A small adjacent punctate infarct is noted at the high right frontal lobe, possibly reflecting an embolic event. No mass lesion is seen. There is no evidence of midline shift.  Cerebellar atrophy is noted. Mild periventricular white matter change likely reflects small vessel ischemic microangiopathy. A small chronic lacunar infarct is noted at the right basal ganglia.  The brainstem and fourth ventricle are within normal limits. The third and lateral ventricles are unremarkable in appearance. The cerebral hemispheres are symmetric in appearance, with normal gray-white differentiation.  There is no evidence of fracture; visualized osseous structures  are unremarkable in appearance. The orbits are within normal limits. The paranasal sinuses and mastoid air cells are well-aerated. No significant soft  tissue abnormalities are seen.  IMPRESSION: 1. 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 Small adjacent punctate infarct also seen at the high right frontal lobe, possibly reflecting an underlying embolic event. No evidence of hemorrhagic transformation at this time. 2. Cerebellar atrophy noted. 3. Mild small vessel ischemic microangiopathy. 4. Small chronic lacunar infarct at the right basal ganglia.  These results were called by telephone at the time of interpretation on 08/18/2014 at 9:08 pm to [facility] PA, who verbally acknowledged these results.   Electronically Signed   By: Garald Balding M.D.   On: 08/18/2014 21:08   Mr Brain Wo Contrast  08/19/2014   CLINICAL DATA:  Initial evaluation for acute left leg weakness and gait instability.  EXAM: MRI HEAD WITHOUT CONTRAST  MRA HEAD WITHOUT CONTRAST  TECHNIQUE: Multiplanar, multiecho pulse sequences of the brain and surrounding structures were obtained without intravenous contrast. Angiographic images of the head were obtained using MRA technique without contrast.  COMPARISON:  Prior CT from earlier the same day.  FINDINGS: MRI HEAD FINDINGS  Diffuse prominence of the CSF containing spaces is compatible with generalized age-related cerebral atrophy. Mild patchy T2/FLAIR hyperintensity within the periventricular and deep white matter both cerebral hemispheres noted, most consistent with chronic small vessel ischemic changes, fairly mild for patient age. Probable small remote lacunar infarcts within the bilateral basal ganglia.  There is abnormal restricted diffusion involving the parasagittal right frontal lobe, consistent with acute right ACA territory infarct. There is involvement of the right body of the corpus callosum. Additional patchy cortical infarcts present within the anterior right frontal lobe as well as the right parietal lobe, also likely right ACA in distribution. No significant mass effect. There are associated scattered  areas of petechial hemorrhage within the area of infarction.  No mass lesion, mass effect, or midline shift.  No hydrocephalus.  Craniocervical junction within normal limits. Pituitary gland normal.  No acute abnormality about the orbits.  Mild mucosal thickening present within left maxillary and left sphenoid sinuses. Paranasal sinuses are otherwise clear. No mastoid effusion. Inner ear structures normal.  Bone marrow signal intensity within normal limits. Scalp soft tissues unremarkable. Probable intramuscular lipoma noted within the right posterior neck.  MRA HEAD FINDINGS  ANTERIOR CIRCULATION:  Study is degraded by motion artifact.  Distal cervical segments of the internal carotid arteries are widely patent with antegrade flow. The petrous cavernous, and supra clinoid segments are widely patent. A1 segments are well opacified. Anterior communicating artery normal.  There is multi focal irregularity and stenosis within the right A2 segment which is overall diminutive as compared to the left. Additionally, there is suggestion of either occlusive or near occlusive thrombus within the right A2 segment with blooming artifact seen in this region on gradient echo sequence (series 2 10, image 72). There is faint opacification of the right A2 segment distally. The left A2 segment demonstrates to focal moderate to severe stenoses, 1 at its base near the anterior communicating artery, the other more distally within the left A2 segment (Series 603, image 4).  There is a focal high-grade short-segment stenosis within the mid right M1 segment (series 602, image 7). Left M1 segment widely patent. MCA bifurcations within normal limits. Probable short-segment moderate stenosis within an inferior left M2 branch (series 606, image 12). Scattered atheromatous irregularity seen throughout the MCA branches.  POSTERIOR CIRCULATION:  Left vertebral artery is dominant and widely patent to the vertebrobasilar junction. Multi focal  irregularity seen within the diminutive right vertebral artery which appears to terminate in PICA. Mild to moderate multi focal atheromatous irregularity present within the basilar artery which is somewhat narrowed at its mid and distal aspects. Superior cerebellar arteries are opacified proximally. There appears to be fetal origin of the right PCA with a widely patent right posterior communicating artery. Left P1 segment widely patent. Distal left PCA branches are attenuated as compared to the right with multi focal atheromatous irregularity. There may be chronic occlusion of the distal left PCA branches given the absence of acute infarct in this territory on today's study.  No aneurysm or vascular malformation.  IMPRESSION: MRI HEAD IMPRESSION:  1. Acute right ACA territory infarct involving the parasagittal right frontal lobe, body of the right corpus callosum, with additional patchy cortical infarcts involving the right frontal and parietal cortex. There are scattered foci of associated petechial hemorrhage without frank hemorrhagic conversion. No significant mass effect. 2. Mild age-related generalized cerebral atrophy with chronic small vessel ischemic disease and small remote lacunar infarcts involving the bilateral basal ganglia.  MRA HEAD IMPRESSION:  1. Occlusive versus near occlusive thrombus within the right A2 segment, compatible with right ACA territory infarct. 2. Short-segment high-grade stenosis within the right M1 segment. 3. Additional multi focal atheromatous irregularity with stenoses throughout the intracranial circulation as detailed above.   Electronically Signed   By: Jeannine Boga M.D.   On: 08/19/2014 01:58   [name] Head/brain Wo Cm  08/19/2014   CLINICAL DATA:  Initial evaluation for acute left leg weakness and gait instability.  EXAM: MRI HEAD WITHOUT CONTRAST  MRA HEAD WITHOUT CONTRAST  TECHNIQUE: Multiplanar, multiecho pulse sequences of the brain and surrounding structures  were obtained without intravenous contrast. Angiographic images of the head were obtained using MRA technique without contrast.  COMPARISON:  Prior CT from earlier the same day.  FINDINGS: MRI HEAD FINDINGS  Diffuse prominence of the CSF containing spaces is compatible with generalized age-related cerebral atrophy. Mild patchy T2/FLAIR hyperintensity within the periventricular and deep white matter both cerebral hemispheres noted, most consistent with chronic small vessel ischemic changes, fairly mild for patient age. Probable small remote lacunar infarcts within the bilateral basal ganglia.  There is abnormal restricted diffusion involving the parasagittal right frontal lobe, consistent with acute right ACA territory infarct. There is involvement of the right body of the corpus callosum. Additional patchy cortical infarcts present within the anterior right frontal lobe as well as the right parietal lobe, also likely right ACA in distribution. No significant mass effect. There are associated scattered areas of petechial hemorrhage within the area of infarction.  No mass lesion, mass effect, or midline shift.  No hydrocephalus.  Craniocervical junction within normal limits. Pituitary gland normal.  No acute abnormality about the orbits.  Mild mucosal thickening present within left maxillary and left sphenoid sinuses. Paranasal sinuses are otherwise clear. No mastoid effusion. Inner ear structures normal.  Bone marrow signal intensity within normal limits. Scalp soft tissues unremarkable. Probable intramuscular lipoma noted within the right posterior neck.  MRA HEAD FINDINGS  ANTERIOR CIRCULATION:  Study is degraded by motion artifact.  Distal cervical segments of the internal carotid arteries are widely patent with antegrade flow. The petrous cavernous, and supra clinoid segments are widely patent. A1 segments are well opacified. Anterior communicating artery normal.  There is multi focal irregularity and stenosis  within the  right A2 segment which is overall diminutive as compared to the left. Additionally, there is suggestion of either occlusive or near occlusive thrombus within the right A2 segment with blooming artifact seen in this region on gradient echo sequence (series 2 10, image 72). There is faint opacification of the right A2 segment distally. The left A2 segment demonstrates to focal moderate to severe stenoses, 1 at its base near the anterior communicating artery, the other more distally within the left A2 segment (Series 603, image 4).  There is a focal high-grade short-segment stenosis within the mid right M1 segment (series 602, image 7). Left M1 segment widely patent. MCA bifurcations within normal limits. Probable short-segment moderate stenosis within an inferior left M2 branch (series 606, image 12). Scattered atheromatous irregularity seen throughout the MCA branches.  POSTERIOR CIRCULATION:  Left vertebral artery is dominant and widely patent to the vertebrobasilar junction. Multi focal irregularity seen within the diminutive right vertebral artery which appears to terminate in PICA. Mild to moderate multi focal atheromatous irregularity present within the basilar artery which is somewhat narrowed at its mid and distal aspects. Superior cerebellar arteries are opacified proximally. There appears to be fetal origin of the right PCA with a widely patent right posterior communicating artery. Left P1 segment widely patent. Distal left PCA branches are attenuated as compared to the right with multi focal atheromatous irregularity. There may be chronic occlusion of the distal left PCA branches given the absence of acute infarct in this territory on today's study.  No aneurysm or vascular malformation.  IMPRESSION: MRI HEAD IMPRESSION:  1. Acute right ACA territory infarct involving the parasagittal right frontal lobe, body of the right corpus callosum, with additional patchy cortical infarcts involving the  right frontal and parietal cortex. There are scattered foci of associated petechial hemorrhage without frank hemorrhagic conversion. No significant mass effect. 2. Mild age-related generalized cerebral atrophy with chronic small vessel ischemic disease and small remote lacunar infarcts involving the bilateral basal ganglia.  MRA HEAD IMPRESSION:  1. Occlusive versus near occlusive thrombus within the right A2 segment, compatible with right ACA territory infarct. 2. Short-segment high-grade stenosis within the right M1 segment. 3. Additional multi focal atheromatous irregularity with stenoses throughout the intracranial circulation as detailed above.   Electronically Signed   By: Jeannine Boga M.D.   On: 08/19/2014 01:58         Subjective: Patient denies fevers, chills, headache, chest pain, dyspnea, nausea, vomiting, diarrhea, abdominal pain, dysuria, hematuria   Objective: Filed Vitals:   08/19/14 1027 08/19/14 1332 08/19/14 1614 08/19/14 1700  BP: 203/94 171/100 201/105 154/86  Pulse: 93 66 74   Temp: 98.1 F (36.7 C) 98.1 F (36.7 C) 98.2 F (36.8 C)   TempSrc: Oral Oral Oral   Resp: 18 18 20    Height:      Weight:      SpO2: 96% 97% 97%     Intake/Output Summary (Last 24 hours) at 08/19/14 1946 Last data filed at 08/19/14 1847  Gross per 24 hour  Intake   1130 ml  Output    400 ml  Net    730 ml   Weight change:  Exam:   General:  Pt is alert, follows commands appropriately, not in acute distress  HEENT: No icterus, No thrush, No neck mass, [place]/AT  Cardiovascular: RRR, S1/S2, no rubs, no gallops  Respiratory: CTA bilaterally, no wheezing, no crackles, no rhonchi  Abdomen: Soft/+BS, non tender, non distended, no guarding  Extremities: trace  LE edema, No lymphangitis, No petechiae, No rashes, no synovitis  Data Reviewed: Basic Metabolic Panel:  Recent Labs Lab 08/18/14 2026 08/18/14 2056 08/19/14 1817  NA 136 139 134*  K 3.4* 3.5 3.0*  CL 99* 99*  99*  CO2 28  --  24  GLUCOSE 264* 261* 174*  BUN 8 8 5*  CREATININE 1.03 0.90 0.95  CALCIUM 9.2  --  8.9   Liver Function Tests:  Recent Labs Lab 08/18/14 2026  AST 19  ALT 13*  ALKPHOS 75  BILITOT 0.9  PROT 7.1  ALBUMIN 3.4*   No results for input(s): LIPASE, AMYLASE in the last 168 hours. No results for input(s): AMMONIA in the last 168 hours. CBC:  Recent Labs Lab 08/18/14 2026 08/18/14 2056  WBC 9.1  --   NEUTROABS 7.0  --   HGB 16.2 16.7  HCT 46.0 49.0  MCV 89.1  --   PLT 243  --    Cardiac Enzymes:  Recent Labs Lab 08/18/14 2026  CKTOTAL 46*   BNP: Invalid input(s): POCBNP CBG:  Recent Labs Lab 08/19/14 0759 08/19/14 1138 08/19/14 1704  GLUCAP 195* 258* 153*    No results found for this or any previous visit (from the past 240 hour(s)).   Scheduled Meds: . aspirin  300 mg Rectal Daily   Or  . aspirin  325 mg Oral Daily  . atorvastatin  80 mg Oral q1800  . heparin  5,000 Units Subcutaneous 3 times per day  . insulin aspart  0-15 Units Subcutaneous TID WC  . insulin aspart  0-5 Units Subcutaneous QHS  . insulin glargine  10 Units Subcutaneous QHS  . metoprolol tartrate  25 mg Oral BID WC   Continuous Infusions:    Turhan Chill, DO  [facility] Pager [phone]  If 7PM-7AM, please contact night-coverage [email or web address] Password [email or web address] 08/19/2014, 7:46 PM   LOS: 1 day

## 2014-08-19 NOTE — Progress Notes (Signed)
Pt adm to 5w rm 16 from ED via stretcher, a/o x4, denies pain, level 0/10, neuro intact. Redness to bilat groin. Plan of care initiated, pt oriented to unit, call light system, fall protocols. Pt educ to call for assist prn, voiced understanding. Assessment completed.

## 2014-08-19 NOTE — Progress Notes (Signed)
  Echocardiogram 2D Echocardiogram has been performed.  Juan [name] 08/19/2014, 12:09 PM

## 2014-08-20 ENCOUNTER — Inpatient Hospital Stay (HOSPITAL_COMMUNITY): Payer: Medicare Other

## 2014-08-20 ENCOUNTER — Encounter (HOSPITAL_COMMUNITY)
Admission: EM | Disposition: A | Discharge status: Skilled Nursing Facility | Payer: Self-pay | Source: Home / Self Care | Attending: Internal Medicine

## 2014-08-20 ENCOUNTER — Encounter (HOSPITAL_COMMUNITY): Payer: Self-pay

## 2014-08-20 ENCOUNTER — Other Ambulatory Visit: Payer: Self-pay | Admitting: Nurse Practitioner

## 2014-08-20 DIAGNOSIS — I472 Ventricular tachycardia: Secondary | ICD-10-CM | POA: Insufficient documentation

## 2014-08-20 DIAGNOSIS — E1159 Type 2 diabetes mellitus with other circulatory complications: Secondary | ICD-10-CM

## 2014-08-20 DIAGNOSIS — I10 Essential (primary) hypertension: Secondary | ICD-10-CM

## 2014-08-20 DIAGNOSIS — I6789 Other cerebrovascular disease: Secondary | ICD-10-CM

## 2014-08-20 DIAGNOSIS — I480 Paroxysmal atrial fibrillation: Secondary | ICD-10-CM

## 2014-08-20 DIAGNOSIS — I6350 Cerebral infarction due to unspecified occlusion or stenosis of unspecified cerebral artery: Secondary | ICD-10-CM

## 2014-08-20 DIAGNOSIS — I639 Cerebral infarction, unspecified: Secondary | ICD-10-CM

## 2014-08-20 DIAGNOSIS — G8190 Hemiplegia, unspecified affecting unspecified side: Secondary | ICD-10-CM

## 2014-08-20 DIAGNOSIS — I340 Nonrheumatic mitral (valve) insufficiency: Secondary | ICD-10-CM

## 2014-08-20 DIAGNOSIS — I4729 Other ventricular tachycardia: Secondary | ICD-10-CM | POA: Insufficient documentation

## 2014-08-20 HISTORY — PX: TEE WITHOUT CARDIOVERSION: SHX5443

## 2014-08-20 LAB — BASIC METABOLIC PANEL
Anion gap: 11 (ref 5–15)
Anion gap: 6 (ref 5–15)
BUN: 6 mg/dL (ref 6–20)
BUN: 7 mg/dL (ref 6–20)
CALCIUM: 8.5 mg/dL — AB (ref 8.9–10.3)
CHLORIDE: 104 mmol/L (ref 101–111)
CO2: 22 mmol/L (ref 22–32)
CO2: 25 mmol/L (ref 22–32)
CREATININE: 0.83 mg/dL (ref 0.61–1.24)
Calcium: 8.6 mg/dL — ABNORMAL LOW (ref 8.9–10.3)
Chloride: 99 mmol/L — ABNORMAL LOW (ref 101–111)
Creatinine, Ser: 0.87 mg/dL (ref 0.61–1.24)
GFR calc Af Amer: >60 mL/min (ref >60)
GFR calc non Af Amer: >60 mL/min (ref >60)
GLUCOSE: 145 mg/dL — AB (ref 65–99)
Glucose, Bld: 152 mg/dL — ABNORMAL HIGH (ref 65–99)
Potassium: 3.1 mmol/L — ABNORMAL LOW (ref 3.5–5.1)
Potassium: 3.8 mmol/L (ref 3.5–5.1)
Sodium: 132 mmol/L — ABNORMAL LOW (ref 135–145)
Sodium: 135 mmol/L (ref 135–145)

## 2014-08-20 LAB — LIPID PANEL
CHOL/HDL RATIO: 5.9 ratio
CHOLESTEROL: 206 mg/dL — AB (ref 0–200)
HDL: 35 mg/dL — ABNORMAL LOW (ref >40)
LDL CALC: 135 mg/dL — AB (ref 0–99)
Triglycerides: 182 mg/dL — ABNORMAL HIGH (ref <150)
VLDL: 36 mg/dL (ref 0–40)

## 2014-08-20 LAB — CBC
HEMATOCRIT: 44 % (ref 39.0–52.0)
Hemoglobin: 15.4 g/dL (ref 13.0–17.0)
MCH: 30.7 pg (ref 26.0–34.0)
MCHC: 35 g/dL (ref 30.0–36.0)
MCV: 87.6 fL (ref 78.0–100.0)
PLATELETS: 245 10*3/uL (ref 150–400)
RBC: 5.02 MIL/uL (ref 4.22–5.81)
RDW: 12.6 % (ref 11.5–15.5)
WBC: 9.7 10*3/uL (ref 4.0–10.5)

## 2014-08-20 LAB — GLUCOSE, CAPILLARY
GLUCOSE-CAPILLARY: 142 mg/dL — AB (ref 65–99)
GLUCOSE-CAPILLARY: 127 mg/dL — AB (ref 65–99)
GLUCOSE-CAPILLARY: 143 mg/dL — AB (ref 65–99)
Glucose-Capillary: 137 mg/dL — ABNORMAL HIGH (ref 65–99)
Glucose-Capillary: 119 mg/dL — ABNORMAL HIGH (ref 65–99)
Glucose-Capillary: 165 mg/dL — ABNORMAL HIGH (ref 65–99)

## 2014-08-20 LAB — TROPONIN I

## 2014-08-20 LAB — MAGNESIUM: Magnesium: 1.7 mg/dL (ref 1.7–2.4)

## 2014-08-20 SURGERY — ECHOCARDIOGRAM, TRANSESOPHAGEAL
Anesthesia: Moderate Sedation

## 2014-08-20 SURGERY — LOOP RECORDER INSERTION
Anesthesia: LOCAL

## 2014-08-20 MED ORDER — METOPROLOL TARTRATE 1 MG/ML IV SOLN
INTRAVENOUS | Status: DC | PRN
  Administered 2014-08-20: 2.5 mg via INTRAVENOUS

## 2014-08-20 MED ORDER — LIDOCAINE VISCOUS 2 % MT SOLN
OROMUCOSAL | Status: AC
  Filled 2014-08-20: qty 15

## 2014-08-20 MED ORDER — MIDAZOLAM HCL 10 MG/2ML IJ SOLN
INTRAMUSCULAR | Status: DC | PRN
Start: 2014-08-20 — End: 2014-08-20
  Administered 2014-08-20: 2 mg via INTRAVENOUS

## 2014-08-20 MED ORDER — MIDAZOLAM HCL 5 MG/ML IJ SOLN
INTRAMUSCULAR | Status: AC
  Filled 2014-08-20: qty 2

## 2014-08-20 MED ORDER — FENTANYL CITRATE (PF) 100 MCG/2ML IJ SOLN
INTRAMUSCULAR | Status: AC
  Filled 2014-08-20: qty 2

## 2014-08-20 MED ORDER — MAGNESIUM SULFATE 2 GM/50ML IV SOLN
2.0000 g | Freq: Once | INTRAVENOUS | Status: AC
  Administered 2014-08-20: 2 g via INTRAVENOUS
  Filled 2014-08-20 (×2): qty 50

## 2014-08-20 MED ORDER — POTASSIUM CHLORIDE CRYS ER 20 MEQ PO TBCR
40.0000 meq | EXTENDED_RELEASE_TABLET | ORAL | Status: AC
  Administered 2014-08-20 (×2): 40 meq via ORAL
  Filled 2014-08-20 (×2): qty 2

## 2014-08-20 MED ORDER — METOPROLOL TARTRATE 1 MG/ML IV SOLN
INTRAVENOUS | Status: AC
  Filled 2014-08-20: qty 5

## 2014-08-20 MED ORDER — BUTAMBEN-TETRACAINE-BENZOCAINE 2-2-14 % EX AERO
INHALATION_SPRAY | CUTANEOUS | Status: DC | PRN
  Administered 2014-08-20: 2 via TOPICAL

## 2014-08-20 MED ORDER — SODIUM CHLORIDE 0.9 % IV SOLN: INTRAVENOUS | Status: DC

## 2014-08-20 NOTE — Progress Notes (Signed)
Physical Therapy Treatment Patient Details Name: Juan [name] MRN: [id number] DOB: [birth year] Today's Date: 08/20/2014    History of Present Illness Pt admitted with left sided weakness and MRI revealed Acute right ACA territory infarct involving the parasagittal right frontal lobe, body of the right corpus callosum, with additional patchy cortical infarcts involving the right frontal and parietal cortex.    PT Comments    Pt very lethargic today, RN states that he has been this way since after PT session yesterday. Yesterday, he was able to ambulate to bathroom with +1 assist, today he was unable to stand with max A. Decreased initiation, problem solving, and muscular activation/ coordination. PT will continue to follow.   Follow Up Recommendations  CIR     Equipment Recommendations  Other (comment) (TBD)    Recommendations for Other Services Rehab consult     Precautions / Restrictions Precautions Precautions: Fall Restrictions Weight Bearing Restrictions: No    Mobility  Bed Mobility Overal bed mobility: Needs Assistance Bed Mobility: Supine to Sit;Sit to Supine     Supine to sit: Max assist Sit to supine: Max assist   General bed mobility comments: hand over hand guidance for pt to reach right arm to left rail and then unable to pull on rail more than minimal amt. Max A to roll to left and get legs off bed. Max A to elevate trunk away from be to achieve sitting. Max A for return to supine, +2 tot A to scoot back to [facility]  Transfers Overall transfer level: Needs assistance Equipment used: Rolling walker (2 wheeled) Transfers: Sit to/from Stand           General transfer comment: pt attempted sit to stand 4x with and without RW, unable to follow commands and bear wt down through legs to achieve standing, even with max A given. Pt resisting fwd wt-shift. Unsure if this is completely due to lethargy today or is symptoms have worsened, RN aware and reports that he  has been like this since after eval yesterday  Ambulation/Gait             General Gait Details: unable   Stairs            Wheelchair Mobility    Modified Rankin (Stroke Patients Only) Modified Rankin (Stroke Patients Only) Pre-Morbid Rankin Score: No symptoms Modified Rankin: Severe disability     Balance Overall balance assessment: Needs assistance Sitting-balance support: Feet supported Sitting balance-Leahy Scale: Zero Sitting balance - Comments: pt required max A for first 5 mins sitting EOB, initial left lean. Then worked on [employer] through left elbow and maintaining balance in this position. After exercise, pt was able to achieve sitting with min-guard over the next 10 mins. Attempted reaching activities but pt participated only minimally Postural control: Left lateral lean                          Cognition Arousal/Alertness: Lethargic Behavior During Therapy: Flat affect Overall Cognitive Status: Impaired/Different from baseline Area of Impairment: Attention;Memory;Following commands;Safety/judgement;Problem solving Orientation Level: Disoriented to;Situation Current Attention Level: Focused Memory: Decreased short-term memory Following Commands: Follows one step commands inconsistently Safety/Judgement: Decreased awareness of safety;Decreased awareness of deficits   Problem Solving: Decreased initiation;Slow processing;Requires verbal cues;Requires tactile cues General Comments: pt falling asleep during session today, even in sitting. Very slow to respond to commands, verbally and physically. Pt seems confused by instructions, minimal verbalization    Exercises  General Comments        Pertinent Vitals/Pain Pain Assessment: No/denies pain    Home Living                      Prior Function            PT Goals (current goals can now be found in the care plan section) Acute Rehab PT Goals Patient Stated Goal: not  stated PT Goal Formulation: With patient Time For Goal Achievement: 09/02/14 Potential to Achieve Goals: Fair Progress towards PT goals: Not progressing toward goals - comment (lethargy)    Frequency  Min 4X/week    PT Plan Current plan remains appropriate    Co-evaluation             End of Session Equipment Utilized During Treatment: Gait belt Activity Tolerance: Patient limited by lethargy Patient left: in bed;with call bell/phone within reach;with bed alarm set     Time: 9373-4287 PT Time Calculation (min) (ACUTE ONLY): 32 min  Charges:  $Therapeutic Activity: 23-37 mins                    G Codes:     Leighton Roach, PT  Acute Rehab Services  [phone]  Leighton Roach 08/20/2014, 5:15 PM

## 2014-08-20 NOTE — Progress Notes (Signed)
VASCULAR LAB PRELIMINARY  PRELIMINARY  PRELIMINARY  PRELIMINARY  Carotid duplex  completed.    Preliminary report:  Bilateral:  1-39% ICA stenosis.  Vertebral artery flow is antegrade.      Sol Odor, RVT 08/20/2014, 2:02 PM

## 2014-08-20 NOTE — Progress Notes (Signed)
STROKE TEAM PROGRESS NOTE   HISTORY Juan [name] is an 64 y.o. male with hx of HTN, HLD, DM, paroxysmal A fib presenting with falls x 2 and gait instability. Symptoms started around 10am this morning when patient fell into a ditch. At that time noted to have left leg weakness and gait instability. Symptoms persisted and he had another fall so family brought him to the ED. CT head imaging reviewed, shows right frontal evolving infarct and question of small punctate infarct in the high right frontal lobe. Unclear A fib history, notes document it was paroxysmal and post  Gall bladder procedure, current EKG and tele shows irregular rhythm but does not appear to be A fib. Patient was last known well 08/18/2014 at 1000. Patient was not administered TPA secondary to outside of tPA window. He was admitted for further evaluation and treatment.   SUBJECTIVE (INTERVAL HISTORY) Patient is now back from TEE. No new complaints. Getting EKG. electrophysiologyhas been to see patient.   OBJECTIVE Temp:  [98.1 F (36.7 C)-99.1 F (37.3 C)] 99 F (37.2 C) (06/15 0845) Pulse Rate:  [38-78] 38 (06/15 0955) Cardiac Rhythm:  [-] Sinus tachycardia (06/14 1942) Resp:  [11-28] 18 (06/15 0955) BP: (128-201)/(58-142) 128/92 mmHg (06/15 0955) SpO2:  [96 %-99 %] 97 % (06/15 0955)   Recent Labs Lab 08/19/14 0759 08/19/14 1138 08/19/14 1704 08/19/14 2207 08/20/14 0753  GLUCAP 195* 258* 153* 155* 137*    Recent Labs Lab 08/18/14 2026 08/18/14 2056 08/19/14 1817 08/20/14 0620  NA 136 139 134* 132*  K 3.4* 3.5 3.0* 3.1*  CL 99* 99* 99* 99*  CO2 28  --  24 22  GLUCOSE 264* 261* 174* 145*  BUN 8 8 5* 6  CREATININE 1.03 0.90 0.95 0.83  CALCIUM 9.2  --  8.9 8.5*  MG  --   --   --  1.7    Recent Labs Lab 08/18/14 2026  AST 19  ALT 13*  ALKPHOS 75  BILITOT 0.9  PROT 7.1  ALBUMIN 3.4*    Recent Labs Lab 08/18/14 2026 08/18/14 2056 08/20/14 0620  WBC 9.1  --  9.7  NEUTROABS 7.0  --   --    HGB 16.2 16.7 15.4  HCT 46.0 49.0 44.0  MCV 89.1  --  87.6  PLT 243  --  245    Recent Labs Lab 08/18/14 2026  CKTOTAL 46*    Recent Labs  08/18/14 2026  LABPROT 14.5  INR 1.11    Recent Labs  08/19/14 0048  COLORURINE AMBER*  LABSPEC 1.020  PHURINE 6.5  GLUCOSEU >1000*  HGBUR NEGATIVE  BILIRUBINUR NEGATIVE  KETONESUR 15*  PROTEINUR NEGATIVE  UROBILINOGEN 0.2  NITRITE NEGATIVE  LEUKOCYTESUR NEGATIVE       Component Value Date/Time   CHOL 206* 08/20/2014 0620   TRIG 182* 08/20/2014 0620   HDL 35* 08/20/2014 0620   CHOLHDL 5.9 08/20/2014 0620   VLDL 36 08/20/2014 0620   LDLCALC 135* 08/20/2014 0620   Lab Results  Component Value Date   HGBA1C 9.2* 06/03/2014      Component Value Date/Time   LABOPIA NONE DETECTED 08/19/2014 0048   COCAINSCRNUR NONE DETECTED 08/19/2014 0048   LABBENZ NONE DETECTED 08/19/2014 0048   AMPHETMU NONE DETECTED 08/19/2014 0048   THCU NONE DETECTED 08/19/2014 0048   LABBARB NONE DETECTED 08/19/2014 0048     Recent Labs Lab 08/18/14 2026  ETH <5    Dg Chest 2 View 08/18/2014    No  active cardiopulmonary disease.     Ct Head Wo Contrast 08/18/2014    1. 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 Small adjacent punctate infarct also seen at the high right frontal lobe, possibly reflecting an underlying embolic event. No evidence of hemorrhagic transformation at this time. 2. Cerebellar atrophy noted. 3. Mild small vessel ischemic microangiopathy. 4. Small chronic lacunar infarct at the right basal ganglia.    Mr Brain Wo Contrast 08/19/2014    1. Acute right ACA territory infarct involving the parasagittal right frontal lobe, body of the right corpus callosum, with additional patchy cortical infarcts involving the right frontal and parietal cortex. There are scattered foci of associated petechial hemorrhage without frank hemorrhagic conversion. No significant mass effect. 2. Mild age-related generalized  cerebral atrophy with chronic small vessel ischemic disease and small remote lacunar infarcts involving the bilateral basal ganglia.  [name] Head/brain Wo Cm 08/19/2014    1. Occlusive versus near occlusive thrombus within the right A2 segment, compatible with right ACA territory infarct. 2. Short-segment high-grade stenosis within the right M1 segment. 3. Additional multi focal atheromatous irregularity with stenoses throughout the intracranial circulation as detailed above.     2D Echocardiogram  - Left ventricle: The cavity size was normal. Wall thickness wasincreased in a pattern of mild LVH. Systolic function wasvigorous. The estimated ejection fraction was in the range of 65%to 70%. Wall motion was normal; there were no regional wallmotion abnormalities. - Left atrium: The atrium was mildly dilated. Impressions:  No cardiac source of emboli was indentified.   PHYSICAL EXAM Frail middle aged Caucasian male currently not in distress. . Afebrile. Head is nontraumatic. Neck is supple without bruit.    Cardiac exam no murmur or gallop. Lungs are clear to auscultation. Distal pulses are well felt. Neurological Exam :  Awake alert oriented 3 normal speech and language. No aphasia or apraxia dysarthria. Extraocular movements are full range with only a few beats of end gaze nystagmus on lateral gaze bilaterally. Fundi were not visualized. Vision acuity seems adequate. Mild left lower facial asymmetry. Tongue is midline. Motor system exam revealed mild left lower extremity drift. Weakness of left grip, intrinsic hand muscles and left hip flexors and ankle dorsiflexors. Diminished fine finger movements on the left. Orbits right over left approximately. Coordination is slightly impaired on the left. DTrs are 2+ symmetric. Sensation is preserved bilaterally. Plantars are downgoing. Gait was not tested. ASSESSMENT/PLAN Mr. Juan [name] is a 64 y.o. male with history of HTN, HLD, DM, paroxysmal A  fib presenting with falls x 2 and gait instability. He did not receive IV t-PA due to delay in arrival.   Stroke:  Non-dominant right ACA infarcts, embolic secondary to unknown source, possible atrial fibrillation given hx of post-chole atrial fibrillation   MRI  Patchy acute R ACA infarcts  MRA  Right A2 occlusion versus thrombus. Right M1 high-grade stenosis. Intracranial atheromatous stenoses throughout.  Carotid Doppler 1-39 % bilateral carotid stenosis  2D Echo  No source of emboli  TEE no source of embolus  [facility] electrophysiologist will consult and consider placement of an implantable loop recorder to evaluate for atrial fibrillation as etiology of stroke. This has been explained to patient/family by Dr. Leonie Man and they are agreeable.   LDL 135  HgbA1c 9.2 in March 2016  Heparin 5000 units sq tid for VTE prophylaxis Diet Heart Room service appropriate?: Yes; Fluid consistency:: Thin  aspirin 81 mg orally every  day prior to admission, now on aspirin 325 mg orally every day  Patient counseled to be compliant with his antithrombotic medications  Ongoing aggressive stroke risk factor management  Therapy recommendations:  CIR. Consult requested  Disposition:  pending   Hx paroxsymal atrial fibrillation  Post cholecystectomy in 2012  Follow up Holter with NSR, some AT. Hochrein followed  Dr. Leonie Man called and discussed with Dr. Caryl Comes today, on call for EP. No precedence for anticoagulation in this situation  TEE neg for embolic source.   loop placement eval by EP arrnaged  Malignant hypertension  Home meds:   lisinopril-hydrochlorothiazide  Unstable. Elevated.  Permissive hypertension (OK if < 220/120) but gradually normalize in 5-7 days   Hyperlipidemia  Home meds:  lipitor 80, resumed in hospital  LDL 135, goal < 70  Continue statin at discharge  Diabetes type II  HgbA1c 9.2 in March 2016, goal < 7.0  Other Stroke Risk  Factors  Advanced age  Pipe smoker, advised to stop smoking  ETOH use  Obesity, Body mass index is 32.83 kg/(m^2).   Hospital day # [place] for Pager information 08/20/2014 12:00 PM   I have personally examined this patient, reviewed notes, independently viewed imaging studies, participated in medical decision making and plan of care. I have made any additions or clarifications directly to the above note. Recommend 30 day heart monitor per EP service prior to consideration for LOOP given prior history of paroxysmal atrial fibrillation in the past and embolic stroke at present.  Antony Contras, MD Medical Director [facility] Pager: [phone] 08/20/2014 3:41 PM    To contact Stroke Continuity provider, please refer to [email or web address] After hours, contact General Neurology

## 2014-08-20 NOTE — Progress Notes (Signed)
Spoke with MD about tele concerns, Pt. Has returned from his TEE. Pt. Alert, given medications. NO acute distress noted at this time. Will continue to monitor.

## 2014-08-20 NOTE — Interval H&P Note (Signed)
History and Physical Interval Note:  08/20/2014 8:55 AM  [name]  has presented today for surgery, with the diagnosis of STROKE  The various methods of treatment have been discussed with the patient and family. After consideration of risks, benefits and other options for treatment, the patient has consented to  Procedure(s) with comments: TRANSESOPHAGEAL ECHOCARDIOGRAM (TEE) (N/A) - PT NEED A LOOP as a surgical intervention .  The patient's history has been reviewed, patient examined, no change in status, stable for surgery.  I have reviewed the patient's chart and labs.  Questions were answered to the patient's satisfaction.     Juan [name] R

## 2014-08-20 NOTE — Progress Notes (Signed)
PROGRESS NOTE  Juan [name] BZJ:[id number] DOB: 1950-11-28 DOA: 08/18/2014 PCP: Cathlean Cower, MD  Brief history 64 year old male with a history of hypertension, diabetes mellitus, paroxysmal fibrillation, hypertension, hyperlipidemia presented with left leg weakness resulting in mechanical fall 2 on the day prior to admission. After his second fall, the patient was found by his son on the floor and EMS was activated. Workup in the emergency department including CT of the brain showed an acute right frontal stroke. MRI of the brain confirmed a right ACA infarct. Neurology was consulted and continues to follow the patient. Notably, the patient stated that he has not taken any of his medications for the past 2-3 months. He had been under a lot of stress taking care of his wife who has advanced dementia.   Assessment/Plan:  NSVT Frequent PVCs and nonsustained VT's is noted on telemetry. Patient remains asymptomatic, although he is slightly lethargic after his procedure. His recent echocardiogram shows normal systolic function. He is noted to be hypokalemic and has borderline hypomagnesemia as well. This is likely contributing to the episodes of PVCs. We will aggressively replete. Patient is already on a beta blocker, which will be continued. Continue to monitor on telemetry. EKG shows nonspecific T-wave changes similar to previous. No concerning ST changes noted.  Acute ischemic stroke -MRI brain shows acute right PCA infarct with a scattered petechial hemorrhages -MRA shows right-A2 and right M1 occlusion -started on Lipitor 80 mg daily -Continue aspirin 325 mg daily  -Neurology is following and await their final input.  -Hemoglobin A1c 9.2 in March -Lipid panel reveals LDL of 135 -Carotid duplex shows bilateral 1-39 percent ICA stenosis. Vertebral artery flow is antegrade. -TEE on 6/15 does not show any clots. 30 day event monitor to be arranged as an outpatient. -PT/OT/ST  following. Inpatient rehabilitation has been recommended. Rehabilitation M.D. consultation is pending.  History of postoperative paroxysmal atrial fibrillation  -CHADSVASc= 4 -Differ anticoagulation to neurology service. He has previously not been on warfarin due to previous low risk.   Diabetes mellitus type 2, poorly controlled -Patient has been off of his medications for over 2 months  -Repeat hemoglobin A1c is negative -06/03/2014 hemoglobin A1c 9.2  -Lantus 10 units at bedtime  -NovoLog sliding scale  -monitor CBGs and adjust accordingly -Discontinue metformin and glipizide for now  History of essential Hypertension  -Again, patient has been off of his antihypertensives medications for over 2 months  -Restarted lisinopril  -Also on  metoprolol tartrate   Hypokalemia Replete aggressively considering episodes of PVCs. Also repleted and magnesium. Repeat basic metabolic panel in the morning.  DVT prophylaxis: Subcutaneous heparin CODE STATUS: Full code Family Communication:   Son updated at beside Disposition Plan: Await evaluation by CIR. Await final neurology input.    Procedures/Studies: Dg Chest 2 View  08/18/2014   CLINICAL DATA:  [place] twice today. Late on ground for 1 hour. Leans to the left. Slurred speech this morning. Elevated blood pressure.  EXAM: CHEST  2 VIEW  COMPARISON:  None.  FINDINGS: Shallow inspiration. The heart size and mediastinal contours are within normal limits. Both lungs are clear. 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  IMPRESSION: No active cardiopulmonary disease.   Electronically Signed   By: Lucienne Capers M.D.   On: 08/18/2014 21:08   Ct Head Wo Contrast  08/18/2014   CLINICAL DATA:  Status post fall. Laid on ground for more than  1 hour. Left-sided lean and slurred speech. Initial encounter.  EXAM: CT HEAD WITHOUT CONTRAST  TECHNIQUE: Contiguous axial images were obtained from the  base of the skull through the vertex without intravenous contrast.  COMPARISON:  None.  FINDINGS: There appears to be an evolving small acute infarct at the high medial right frontal lobe. There is no evidence of hemorrhagic transformation. A small adjacent punctate infarct is noted at the high right frontal lobe, possibly reflecting an embolic event. No mass lesion is seen. There is no evidence of midline shift.  Cerebellar atrophy is noted. Mild periventricular white matter change likely reflects small vessel ischemic microangiopathy. A small chronic lacunar infarct is noted at the right basal ganglia.  The brainstem and fourth ventricle are within normal limits. The third and lateral ventricles are unremarkable in appearance. The cerebral hemispheres are symmetric in appearance, with normal gray-white differentiation.  There is no evidence of fracture; visualized osseous structures are unremarkable in appearance. The orbits are within normal limits. The paranasal sinuses and mastoid air cells are well-aerated. No significant soft tissue abnormalities are seen.  IMPRESSION: 1. 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 Small adjacent punctate infarct also seen at the high right frontal lobe, possibly reflecting an underlying embolic event. No evidence of hemorrhagic transformation at this time. 2. Cerebellar atrophy noted. 3. Mild small vessel ischemic microangiopathy. 4. Small chronic lacunar infarct at the right basal ganglia.  These results were called by telephone at the time of interpretation on 08/18/2014 at 9:08 pm to [facility] PA, who verbally acknowledged these results.   Electronically Signed   By: Garald Balding M.D.   On: 08/18/2014 21:08   Mr Brain Wo Contrast  08/19/2014   CLINICAL DATA:  Initial evaluation for acute left leg weakness and gait instability.  EXAM: MRI HEAD WITHOUT CONTRAST  MRA HEAD WITHOUT CONTRAST  TECHNIQUE: Multiplanar, multiecho pulse sequences of the  brain and surrounding structures were obtained without intravenous contrast. Angiographic images of the head were obtained using MRA technique without contrast.  COMPARISON:  Prior CT from earlier the same day.  FINDINGS: MRI HEAD FINDINGS  Diffuse prominence of the CSF containing spaces is compatible with generalized age-related cerebral atrophy. Mild patchy T2/FLAIR hyperintensity within the periventricular and deep white matter both cerebral hemispheres noted, most consistent with chronic small vessel ischemic changes, fairly mild for patient age. Probable small remote lacunar infarcts within the bilateral basal ganglia.  There is abnormal restricted diffusion involving the parasagittal right frontal lobe, consistent with acute right ACA territory infarct. There is involvement of the right body of the corpus callosum. Additional patchy cortical infarcts present within the anterior right frontal lobe as well as the right parietal lobe, also likely right ACA in distribution. No significant mass effect. There are associated scattered areas of petechial hemorrhage within the area of infarction.  No mass lesion, mass effect, or midline shift.  No hydrocephalus.  Craniocervical junction within normal limits. Pituitary gland normal.  No acute abnormality about the orbits.  Mild mucosal thickening present within left maxillary and left sphenoid sinuses. Paranasal sinuses are otherwise clear. No mastoid effusion. Inner ear structures normal.  Bone marrow signal intensity within normal limits. Scalp soft tissues unremarkable. Probable intramuscular lipoma noted within the right posterior neck.  MRA HEAD FINDINGS  ANTERIOR CIRCULATION:  Study is degraded by motion artifact.  Distal cervical segments of the internal carotid arteries are widely patent with antegrade flow. The petrous cavernous, and supra  clinoid segments are widely patent. A1 segments are well opacified. Anterior communicating artery normal.  There is multi  focal irregularity and stenosis within the right A2 segment which is overall diminutive as compared to the left. Additionally, there is suggestion of either occlusive or near occlusive thrombus within the right A2 segment with blooming artifact seen in this region on gradient echo sequence (series 2 10, image 72). There is faint opacification of the right A2 segment distally. The left A2 segment demonstrates to focal moderate to severe stenoses, 1 at its base near the anterior communicating artery, the other more distally within the left A2 segment (Series 603, image 4).  There is a focal high-grade short-segment stenosis within the mid right M1 segment (series 602, image 7). Left M1 segment widely patent. MCA bifurcations within normal limits. Probable short-segment moderate stenosis within an inferior left M2 branch (series 606, image 12). Scattered atheromatous irregularity seen throughout the MCA branches.  POSTERIOR CIRCULATION:  Left vertebral artery is dominant and widely patent to the vertebrobasilar junction. Multi focal irregularity seen within the diminutive right vertebral artery which appears to terminate in PICA. Mild to moderate multi focal atheromatous irregularity present within the basilar artery which is somewhat narrowed at its mid and distal aspects. Superior cerebellar arteries are opacified proximally. There appears to be fetal origin of the right PCA with a widely patent right posterior communicating artery. Left P1 segment widely patent. Distal left PCA branches are attenuated as compared to the right with multi focal atheromatous irregularity. There may be chronic occlusion of the distal left PCA branches given the absence of acute infarct in this territory on today's study.  No aneurysm or vascular malformation.  IMPRESSION: MRI HEAD IMPRESSION:  1. Acute right ACA territory infarct involving the parasagittal right frontal lobe, body of the right corpus callosum, with additional patchy  cortical infarcts involving the right frontal and parietal cortex. There are scattered foci of associated petechial hemorrhage without frank hemorrhagic conversion. No significant mass effect. 2. Mild age-related generalized cerebral atrophy with chronic small vessel ischemic disease and small remote lacunar infarcts involving the bilateral basal ganglia.  MRA HEAD IMPRESSION:  1. Occlusive versus near occlusive thrombus within the right A2 segment, compatible with right ACA territory infarct. 2. Short-segment high-grade stenosis within the right M1 segment. 3. Additional multi focal atheromatous irregularity with stenoses throughout the intracranial circulation as detailed above.   Electronically Signed   By: Jeannine Boga M.D.   On: 08/19/2014 01:58   [name] Head/brain Wo Cm  08/19/2014   CLINICAL DATA:  Initial evaluation for acute left leg weakness and gait instability.  EXAM: MRI HEAD WITHOUT CONTRAST  MRA HEAD WITHOUT CONTRAST  TECHNIQUE: Multiplanar, multiecho pulse sequences of the brain and surrounding structures were obtained without intravenous contrast. Angiographic images of the head were obtained using MRA technique without contrast.  COMPARISON:  Prior CT from earlier the same day.  FINDINGS: MRI HEAD FINDINGS  Diffuse prominence of the CSF containing spaces is compatible with generalized age-related cerebral atrophy. Mild patchy T2/FLAIR hyperintensity within the periventricular and deep white matter both cerebral hemispheres noted, most consistent with chronic small vessel ischemic changes, fairly mild for patient age. Probable small remote lacunar infarcts within the bilateral basal ganglia.  There is abnormal restricted diffusion involving the parasagittal right frontal lobe, consistent with acute right ACA territory infarct. There is involvement of the right body of the corpus callosum. Additional patchy cortical infarcts present within the anterior right frontal lobe  as well as the  right parietal lobe, also likely right ACA in distribution. No significant mass effect. There are associated scattered areas of petechial hemorrhage within the area of infarction.  No mass lesion, mass effect, or midline shift.  No hydrocephalus.  Craniocervical junction within normal limits. Pituitary gland normal.  No acute abnormality about the orbits.  Mild mucosal thickening present within left maxillary and left sphenoid sinuses. Paranasal sinuses are otherwise clear. No mastoid effusion. Inner ear structures normal.  Bone marrow signal intensity within normal limits. Scalp soft tissues unremarkable. Probable intramuscular lipoma noted within the right posterior neck.  MRA HEAD FINDINGS  ANTERIOR CIRCULATION:  Study is degraded by motion artifact.  Distal cervical segments of the internal carotid arteries are widely patent with antegrade flow. The petrous cavernous, and supra clinoid segments are widely patent. A1 segments are well opacified. Anterior communicating artery normal.  There is multi focal irregularity and stenosis within the right A2 segment which is overall diminutive as compared to the left. Additionally, there is suggestion of either occlusive or near occlusive thrombus within the right A2 segment with blooming artifact seen in this region on gradient echo sequence (series 2 10, image 72). There is faint opacification of the right A2 segment distally. The left A2 segment demonstrates to focal moderate to severe stenoses, 1 at its base near the anterior communicating artery, the other more distally within the left A2 segment (Series 603, image 4).  There is a focal high-grade short-segment stenosis within the mid right M1 segment (series 602, image 7). Left M1 segment widely patent. MCA bifurcations within normal limits. Probable short-segment moderate stenosis within an inferior left M2 branch (series 606, image 12). Scattered atheromatous irregularity seen throughout the MCA branches.   POSTERIOR CIRCULATION:  Left vertebral artery is dominant and widely patent to the vertebrobasilar junction. Multi focal irregularity seen within the diminutive right vertebral artery which appears to terminate in PICA. Mild to moderate multi focal atheromatous irregularity present within the basilar artery which is somewhat narrowed at its mid and distal aspects. Superior cerebellar arteries are opacified proximally. There appears to be fetal origin of the right PCA with a widely patent right posterior communicating artery. Left P1 segment widely patent. Distal left PCA branches are attenuated as compared to the right with multi focal atheromatous irregularity. There may be chronic occlusion of the distal left PCA branches given the absence of acute infarct in this territory on today's study.  No aneurysm or vascular malformation.  IMPRESSION: MRI HEAD IMPRESSION:  1. Acute right ACA territory infarct involving the parasagittal right frontal lobe, body of the right corpus callosum, with additional patchy cortical infarcts involving the right frontal and parietal cortex. There are scattered foci of associated petechial hemorrhage without frank hemorrhagic conversion. No significant mass effect. 2. Mild age-related generalized cerebral atrophy with chronic small vessel ischemic disease and small remote lacunar infarcts involving the bilateral basal ganglia.  MRA HEAD IMPRESSION:  1. Occlusive versus near occlusive thrombus within the right A2 segment, compatible with right ACA territory infarct. 2. Short-segment high-grade stenosis within the right M1 segment. 3. Additional multi focal atheromatous irregularity with stenoses throughout the intracranial circulation as detailed above.   Electronically Signed   By: Jeannine Boga M.D.   On: 08/19/2014 01:58      Subjective: Patient somewhat somnolent after his procedure this morning. Denies any complaints.    Objective: Filed Vitals:   08/20/14 0940  08/20/14 0945 08/20/14 0950 08/20/14 0955  BP:  136/58  128/92 128/92  Pulse: 51 56 51 38  Temp:      TempSrc:      Resp: 11 15 18 18   Height:      Weight:      SpO2: 98% 98% 98% 97%    Intake/Output Summary (Last 24 hours) at 08/20/14 1057 Last data filed at 08/20/14 0704  Gross per 24 hour  Intake    610 ml  Output      0 ml  Net    610 ml   Weight change:   Exam:   General:  Pt is somnolent, arousable. not in acute distress  Cardiovascular: Multiple premature beats. S1/S2, no rubs, no gallops  Respiratory: CTA bilaterally, no wheezing, no crackles, no rhonchi  Abdomen: Soft/+BS, non tender, non distended, no guarding  Extremities: trace LE edema  Neurology: Left lower extremity weakness is appreciated.  Data Reviewed: Basic Metabolic Panel:  Recent Labs Lab 08/18/14 2026 08/18/14 2056 08/19/14 1817 08/20/14 0620  NA 136 139 134* 132*  K 3.4* 3.5 3.0* 3.1*  CL 99* 99* 99* 99*  CO2 28  --  24 22  GLUCOSE 264* 261* 174* 145*  BUN 8 8 5* 6  CREATININE 1.03 0.90 0.95 0.83  CALCIUM 9.2  --  8.9 8.5*  MG  --   --   --  1.7   Liver Function Tests:  Recent Labs Lab 08/18/14 2026  AST 19  ALT 13*  ALKPHOS 75  BILITOT 0.9  PROT 7.1  ALBUMIN 3.4*   CBC:  Recent Labs Lab 08/18/14 2026 08/18/14 2056 08/20/14 0620  WBC 9.1  --  9.7  NEUTROABS 7.0  --   --   HGB 16.2 16.7 15.4  HCT 46.0 49.0 44.0  MCV 89.1  --  87.6  PLT 243  --  245   Cardiac Enzymes:  Recent Labs Lab 08/18/14 2026  CKTOTAL 46*   CBG:  Recent Labs Lab 08/19/14 0759 08/19/14 1138 08/19/14 1704 08/19/14 2207 08/20/14 0753  GLUCAP 195* 258* 153* 155* 137*    Scheduled Meds: . aspirin  300 mg Rectal Daily   Or  . aspirin  325 mg Oral Daily  . atorvastatin  80 mg Oral q1800  . heparin  5,000 Units Subcutaneous 3 times per day  . insulin aspart  0-15 Units Subcutaneous TID WC  . insulin aspart  0-5 Units Subcutaneous QHS  . insulin glargine  10 Units Subcutaneous  QHS  . lisinopril  20 mg Oral Daily  . magnesium sulfate 1 - 4 g bolus IVPB  2 g Intravenous Once  . metoprolol tartrate  25 mg Oral BID WC  . potassium chloride  40 mEq Oral Q4H   Continuous Infusions: . sodium chloride       Javonni Macke  [facility] Pager [phone]  If 7PM-7AM, please contact night-coverage [email or web address] Password [email or web address] 08/20/2014, 10:57 AM   LOS: 2 days

## 2014-08-20 NOTE — Progress Notes (Signed)
PT Cancellation Note  Patient Details Name: Juan [name] MRN: [id number] DOB: 1950/07/25   Cancelled Treatment:    Reason Eval/Treat Not Completed: Patient at procedure or test/unavailable, will check back as time allows.   Carlos, [place] 08/20/2014, 10:00 AM

## 2014-08-20 NOTE — Progress Notes (Signed)
  Echocardiogram Echocardiogram Transesophageal has been performed.  Juan [name] 08/20/2014, 9:35 AM

## 2014-08-20 NOTE — Progress Notes (Signed)
LM with Genie Admission coordinator with CIR to check in on the status of admission evaluatin. PT and OT eval done.

## 2014-08-20 NOTE — Progress Notes (Signed)
SLP Cancellation Note  Patient Details Name: Juan [name] MRN: [id number] DOB: 1951-02-11   Cancelled treatment:       Reason Eval/Treat Not Completed: Patient at procedure or test/unavailable. SLP attempted speech/language evaluation x2 - first pt off the floor for TEE, then having testing done in room. Will continue efforts.   Germain Osgood, M.A. CCC-SLP [phone]  Germain Osgood 08/20/2014, 11:47 AM

## 2014-08-20 NOTE — Consult Note (Signed)
ELECTROPHYSIOLOGY CONSULT NOTE  Patient ID: Juan [name] MRN: [id number], DOB/AGE: April 05, 1950   Admit date: 08/18/2014 Date of Consult: 08/20/2014  Primary Physician: Cathlean Cower, MD Primary Cardiologist: Percival Spanish Reason for Consultation: Cryptogenic stroke; recommendations regarding Implantable Loop Recorder  History of Present Illness Juan [name] was admitted on 08/18/2014 with falls and gait instability.  Imaging demonstrated non dominant right ACA infarcts.  He has undergone workup for stroke including echocardiogram and carotid dopplers.  The patient has been monitored on telemetry which has demonstrated sinus rhythm with frequent runs of atrial tachycardia.  Inpatient stroke work-up is to be completed with a TEE.   Echocardiogram this admission demonstrated EF 65-70%, no RWMA, LA 43.  Lab work is reviewed.   Prior to admission, the patient denies chest pain, shortness of breath, dizziness, palpitations, or syncope.  They are recovering from their stroke with plans to participate in CIR at discharge.  There are significant social concerns with the patient's wife's ability to care for herself and possible need for SNF that is causing increased stress.   He has documented post-op AF in 2012 after cholecystectomy.  He has worn holter and event monitors with no further AF detected.   EP has been asked to evaluate for placement of an implantable loop recorder to monitor for atrial fibrillation.  ROS is negative except as outlined above.    Past Medical History  Diagnosis Date  . Hypertension   . Gout   . Obesity   . Hyperlipidemia   . Diabetes mellitus     type II  . Paroxysmal atrial fibrillation     postoperatively  . DIABETES MELLITUS, TYPE II 07/27/2007  . GOUT 12/01/2006  . HYPERLIPIDEMIA 01/19/2007  . HYPERTENSION 12/01/2006  . 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 OBESITY, HX OF 12/01/2006  . Cataract   . Hx of colonic polyps 05/30/2008     Surgical  History:  Past Surgical History  Procedure Laterality Date  . Cholecystectomy      Laparoscopic  . Cataract extraction      bilateral  . Colonoscopy       Prescriptions prior to admission  Medication Sig Dispense Refill Last Dose  . allopurinol (ZYLOPRIM) 100 MG tablet Take 1 tablet (100 mg total) by mouth daily. 90 tablet 3 Past Month at Unknown time  . aspirin EC 81 MG tablet Take 1 tablet (81 mg total) by mouth daily. (Patient not taking: Reported on 08/18/2014) 90 tablet 11 Not Taking at Unknown time  . atorvastatin (LIPITOR) 80 MG tablet Take 1 tablet (80 mg total) by mouth daily. (Patient not taking: Reported on 08/18/2014) 90 tablet 3 Not Taking at Unknown time  . glipiZIDE (GLUCOTROL XL) 10 MG 24 hr tablet Take 1 tablet (10 mg total) by mouth daily with breakfast. (Patient not taking: Reported on 08/18/2014) 90 tablet 3 Not Taking at Unknown time  . glucose blood (ONE TOUCH TEST STRIPS) test strip Use as directed 1 per day 250.02 100 each 11 Taking  . lisinopril-hydrochlorothiazide (PRINZIDE,ZESTORETIC) 20-12.5 MG per tablet Take 1 tablet by mouth daily. (Patient not taking: Reported on 08/18/2014) 90 tablet 3 Not Taking at Unknown time  . metFORMIN (GLUCOPHAGE-XR) 500 MG 24 hr tablet 2 tabs by mouth in the AM (Patient not taking: Reported on 08/18/2014) 180 tablet 3 Not Taking at Unknown time    Inpatient Medications:  . aspirin  300 mg Rectal Daily   Or  . aspirin  325 mg Oral Daily  .  atorvastatin  80 mg Oral q1800  . heparin  5,000 Units Subcutaneous 3 times per day  . insulin aspart  0-15 Units Subcutaneous TID WC  . insulin aspart  0-5 Units Subcutaneous QHS  . insulin glargine  10 Units Subcutaneous QHS  . lisinopril  20 mg Oral Daily  . metoprolol tartrate  25 mg Oral BID WC    Allergies: No Known Allergies  History   Social History  . Marital Status: Married    Spouse Name: N/A  . Number of Children: 2  . Years of Education: N/A   Occupational History  .  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 Smoking status: Current Every Day Smoker    Types: Pipe  . Smokeless tobacco: Never Used     Comment: occasional  . Alcohol Use: 0.0 oz/week    0 Standard drinks or equivalent per week     Comment: 2 drinks a day-beer  . Drug Use: No     Comment: 2-3 beer/day  . Sexual Activity: Not on file   Other Topics Concern  . Not on file   Social History Narrative     Family History  Problem Relation Age of Onset  . Heart attack Father   . Coronary artery disease Father   . Colon cancer Neg Hx      Physical Exam: Filed Vitals:   08/19/14 2127 08/20/14 0618 08/20/14 0621 08/20/14 0845  BP: 162/97 193/118 185/93 187/114  Pulse: 70 69 50 78  Temp: 99.1 F (37.3 C) 98.5 F (36.9 C)  99 F (37.2 C)  TempSrc: Oral Oral  Oral  Resp: 18 18  23   Height:      Weight:      SpO2: 98% 97%  98%    GEN- The patient is drowsy, but arouses, alert and oriented x 3 today.   Head- normocephalic, atraumatic Eyes-  Sclera clear, conjunctiva pink Ears- hearing intact Oropharynx- clear Neck- supple, Lungs- Clear to ausculation bilaterally, normal work of breathing Heart- Irregular rate and rhythm, no murmurs, rubs or gallops  GI- soft, NT, ND, + BS, +foley Extremities- no clubbing, cyanosis, or edema MS- no significant deformity or atrophy Skin- no rash or lesion Psych- euthymic mood, full affect   Labs:   Lab Results  Component Value Date   WBC 9.7 08/20/2014   HGB 15.4 08/20/2014   HCT 44.0 08/20/2014   MCV 87.6 08/20/2014   PLT 245 08/20/2014    Recent Labs Lab 08/18/14 2026  08/20/14 0620  NA 136  < > 132*  K 3.4*  < > 3.1*  CL 99*  < > 99*  CO2 28  < > 22  BUN 8  < > 6  CREATININE 1.03  < > 0.83  CALCIUM 9.2  < > 8.5*  PROT 7.1  --   --   BILITOT 0.9  --   --   ALKPHOS 75  --   --   ALT 13*  --   --   AST 19  --   --   GLUCOSE 264*  < > 145*  < > = values in this interval not  displayed.  Radiology/Studies: Dg Chest 2 View 08/18/2014   CLINICAL DATA:  [place] twice today. Late on ground for 1 hour. Leans to the left. Slurred speech this morning. Elevated blood pressure.  EXAM: CHEST  2 VIEW  COMPARISON:  None.  FINDINGS: Shallow inspiration. The  heart size and mediastinal contours are within normal limits. Both lungs are clear. 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  IMPRESSION: No active cardiopulmonary disease.   Electronically Signed   By: Lucienne Capers M.D.   On: 08/18/2014 21:08   Ct Head Wo Contrast 08/18/2014   CLINICAL DATA:  Status post fall. Laid on ground for more than 1 hour. Left-sided lean and slurred speech. Initial encounter.  EXAM: CT HEAD WITHOUT CONTRAST  TECHNIQUE: Contiguous axial images were obtained from the base of the skull through the vertex without intravenous contrast.  COMPARISON:  None.  FINDINGS: There appears to be an evolving small acute infarct at the high medial right frontal lobe. There is no evidence of hemorrhagic transformation. A small adjacent punctate infarct is noted at the high right frontal lobe, possibly reflecting an embolic event. No mass lesion is seen. There is no evidence of midline shift.  Cerebellar atrophy is noted. Mild periventricular white matter change likely reflects small vessel ischemic microangiopathy. A small chronic lacunar infarct is noted at the right basal ganglia.  The brainstem and fourth ventricle are within normal limits. The third and lateral ventricles are unremarkable in appearance. The cerebral hemispheres are symmetric in appearance, with normal gray-white differentiation.  There is no evidence of fracture; visualized osseous structures are unremarkable in appearance. The orbits are within normal limits. The paranasal sinuses and mastoid air cells are well-aerated. No significant soft tissue abnormalities are seen.  IMPRESSION: 1. Evolving small  acute infarct at the high medial right frontal lobe. Small adjacent punctate infarct also seen at the high right frontal lobe, possibly reflecting an underlying embolic event. No evidence of hemorrhagic transformation at this time. 2. Cerebellar atrophy noted. 3. Mild small vessel ischemic microangiopathy. 4. Small chronic lacunar infarct at the right basal ganglia.  These results were called by telephone at the time of interpretation on 08/18/2014 at 9:08 pm to [facility] PA, who verbally acknowledged these results.   Electronically Signed   By: Garald Balding M.D.   On: 08/18/2014 21:08   Mr Brain Wo Contrast 08/19/2014   CLINICAL DATA:  Initial evaluation for acute left leg weakness and gait instability.  EXAM: MRI HEAD WITHOUT CONTRAST  MRA HEAD WITHOUT CONTRAST  TECHNIQUE: Multiplanar, multiecho pulse sequences of the brain and surrounding structures were obtained without intravenous contrast. Angiographic images of the head were obtained using MRA technique without contrast.  COMPARISON:  Prior CT from earlier the same day.  FINDINGS: MRI HEAD FINDINGS  Diffuse prominence of the CSF containing spaces is compatible with generalized age-related cerebral atrophy. Mild patchy T2/FLAIR hyperintensity within the periventricular and deep white matter both cerebral hemispheres noted, most consistent with chronic small vessel ischemic changes, fairly mild for patient age. Probable small remote lacunar infarcts within the bilateral basal ganglia.  There is abnormal restricted diffusion involving the parasagittal right frontal lobe, consistent with acute right ACA territory infarct. There is involvement of the right body of the corpus callosum. Additional patchy cortical infarcts present within the anterior right frontal lobe as well as the right parietal lobe, also likely right ACA in distribution. No significant mass effect. There are associated scattered areas of petechial hemorrhage within the area of infarction.  No  mass lesion, mass effect, or midline shift.  No hydrocephalus.  Craniocervical junction within normal limits. Pituitary gland normal.  No acute abnormality about the orbits.  Mild mucosal thickening present within left maxillary and left sphenoid sinuses.  Paranasal sinuses are otherwise clear. No mastoid effusion. Inner ear structures normal.  Bone marrow signal intensity within normal limits. Scalp soft tissues unremarkable. Probable intramuscular lipoma noted within the right posterior neck.  MRA HEAD FINDINGS  ANTERIOR CIRCULATION:  Study is degraded by motion artifact.  Distal cervical segments of the internal carotid arteries are widely patent with antegrade flow. The petrous cavernous, and supra clinoid segments are widely patent. A1 segments are well opacified. Anterior communicating artery normal.  There is multi focal irregularity and stenosis within the right A2 segment which is overall diminutive as compared to the left. Additionally, there is suggestion of either occlusive or near occlusive thrombus within the right A2 segment with blooming artifact seen in this region on gradient echo sequence (series 2 10, image 72). There is faint opacification of the right A2 segment distally. The left A2 segment demonstrates to focal moderate to severe stenoses, 1 at its base near the anterior communicating artery, the other more distally within the left A2 segment (Series 603, image 4).  There is a focal high-grade short-segment stenosis within the mid right M1 segment (series 602, image 7). Left M1 segment widely patent. MCA bifurcations within normal limits. Probable short-segment moderate stenosis within an inferior left M2 branch (series 606, image 12). Scattered atheromatous irregularity seen throughout the MCA branches.  POSTERIOR CIRCULATION:  Left vertebral artery is dominant and widely patent to the vertebrobasilar junction. Multi focal irregularity seen within the diminutive right vertebral artery which  appears to terminate in PICA. Mild to moderate multi focal atheromatous irregularity present within the basilar artery which is somewhat narrowed at its mid and distal aspects. Superior cerebellar arteries are opacified proximally. There appears to be fetal origin of the right PCA with a widely patent right posterior communicating artery. Left P1 segment widely patent. Distal left PCA branches are attenuated as compared to the right with multi focal atheromatous irregularity. There may be chronic occlusion of the distal left PCA branches given the absence of acute infarct in this territory on today's study.  No aneurysm or vascular malformation.  IMPRESSION: MRI HEAD IMPRESSION:  1. Acute right ACA territory infarct involving the parasagittal right frontal lobe, body of the right corpus callosum, with additional patchy cortical infarcts involving the right frontal and parietal cortex. There are scattered foci of associated petechial hemorrhage without frank hemorrhagic conversion. No significant mass effect. 2. Mild age-related generalized cerebral atrophy with chronic small vessel ischemic disease and small remote lacunar infarcts involving the bilateral basal ganglia.  MRA HEAD IMPRESSION:  1. Occlusive versus near occlusive thrombus within the right A2 segment, compatible with right ACA territory infarct. 2. Short-segment high-grade stenosis within the right M1 segment. 3. Additional multi focal atheromatous irregularity with stenoses throughout the intracranial circulation as detailed above.   Electronically Signed   By: Jeannine Boga M.D.   On: 08/19/2014 01:58   12-lead ECG sinus rhythm, rate 83, short run atrial tachycardia, RBBB (new since 2012) All prior EKG's in EPIC reviewed with no documented atrial fibrillation  Telemetry sinus rhythm with frequent short runs of atrial tachycardia  Assessment and Plan:  1. Cryptogenic stroke The patient presents with cryptogenic stroke.  The patient has  a TEE planned for this AM. He has documented atrial fibrillation following cholecystectomy but none since.  EKG and telemetry demonstrates frequent atrial ectopy.  LA size is 43. Pre-test probability of atrial fibrillation is increased. Would place 30 day event monitor at discharge with follow up with Dr Percival Spanish.  If no AF identified on 30 day monitor, can place loop recorder at that time.  I have discussed with patient who agrees with plan.  Will ask our office to call patient to arrange 30 day monitor and outpatient cardiology follow up.   Please call with questions.   Chanetta Marshall, NP 08/20/2014 9:00 AM

## 2014-08-20 NOTE — Consult Note (Signed)
Physical Medicine and Rehabilitation Consult Reason for Consult: Right ACA infarct Referring Physician: [facility]   HPI: Juan [name] is a 64 y.o. right handed male with history of hypertension, diabetes mellitus with peripheral neuropathy, PAF on no anticoagulation prior to admission and question medical noncompliance as he had stopped all medications 2-3 months ago. Independent prior to admission living with his wife and son. Presented 08/18/2014 with left-sided weakness and fall 2. MRI of the brain shows acute right ACA territory infarct involving the parasagittal right frontal lobe, body of the right corpus colostrum with additional patchy cortical infarcts right frontal and parietal cortex. MRA of the head with occlusive versus near occlusive thrombus within the right A2 segment. Patient did not receive TPA. Echocardiogram with ejection fraction of 70% no wall motion abnormalities without emboli. Neurology consulted presently on aspirin for CVA prophylaxis as well as subcutaneous heparin for DVT prophylaxis. TEE is pending. Presently on contact precautions for questionable C. difficile with specimen pending. Physical and occupational therapy evaluation completed 08/19/2014 with recommendations of physical medicine rehabilitation consult.   Review of Systems  Constitutional: Negative for fever and chills.  HENT: Negative for hearing loss.   Eyes: Negative for blurred vision and double vision.  Respiratory: Positive for cough. Negative for shortness of breath.   Cardiovascular: Positive for palpitations.  Gastrointestinal: Positive for heartburn, nausea and constipation.  Genitourinary: Positive for urgency. Negative for dysuria.  Musculoskeletal: Positive for myalgias, joint pain and falls.  Skin: Negative for rash.  Neurological: Positive for dizziness. Negative for headaches.       Postural lightheadedness   Past Medical History  Diagnosis Date  . Hypertension   . Gout   .  Obesity   . Hyperlipidemia   . Diabetes mellitus     type II  . Paroxysmal atrial fibrillation     postoperatively  . DIABETES MELLITUS, TYPE II 07/27/2007  . GOUT 12/01/2006  . HYPERLIPIDEMIA 01/19/2007  . HYPERTENSION 12/01/2006  . 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 OBESITY, HX OF 12/01/2006  . Cataract   . Hx of colonic polyps 05/30/2008   Past Surgical History  Procedure Laterality Date  . Cholecystectomy      Laparoscopic  . Cataract extraction      bilateral  . Colonoscopy     Family History  Problem Relation Age of Onset  . Heart attack Father   . Coronary artery disease Father   . Colon cancer Neg Hx    Social History:  reports that he has been smoking Pipe.  He has never used smokeless tobacco. He reports that he drinks alcohol. He reports that he does not use illicit drugs. Allergies: No Known Allergies Medications Prior to Admission  Medication Sig Dispense Refill  . allopurinol (ZYLOPRIM) 100 MG tablet Take 1 tablet (100 mg total) by mouth daily. 90 tablet 3  . aspirin EC 81 MG tablet Take 1 tablet (81 mg total) by mouth daily. (Patient not taking: Reported on 08/18/2014) 90 tablet 11  . atorvastatin (LIPITOR) 80 MG tablet Take 1 tablet (80 mg total) by mouth daily. (Patient not taking: Reported on 08/18/2014) 90 tablet 3  . glipiZIDE (GLUCOTROL XL) 10 MG 24 hr tablet Take 1 tablet (10 mg total) by mouth daily with breakfast. (Patient not taking: Reported on 08/18/2014) 90 tablet 3  . glucose blood (ONE TOUCH TEST STRIPS) test strip Use as directed 1 per day 250.02 100 each 11  . lisinopril-hydrochlorothiazide (PRINZIDE,ZESTORETIC) 20-12.5 MG per tablet Take  1 tablet by mouth daily. (Patient not taking: Reported on 08/18/2014) 90 tablet 3  . metFORMIN (GLUCOPHAGE-XR) 500 MG 24 hr tablet 2 tabs by mouth in the AM (Patient not taking: Reported on 08/18/2014) 180 tablet 3    Home: Home Living Family/patient expects to be discharged to:: Private residence Living  Arrangements: Spouse/significant other (per pt wife is "invalid" and he cares for her) Available Help at Discharge:  (pt usually cares for wife) Type of Home: House Home Layout: Two level, Bed/bath upstairs Home Equipment: Environmental consultant - 2 wheels, Shower seat, Grab bars - tub/shower (wife's walker-unsure if she still uses it) Additional Comments: Currently pt's son is caring for pt's wife  Functional History: Prior Function Level of Independence: Independent Functional Status:  Mobility: Bed Mobility Overal bed mobility: Needs Assistance Bed Mobility: Supine to Sit Supine to sit: Min assist, HOB elevated General bed mobility comments: not assessed Transfers Overall transfer level: Needs assistance Equipment used: Rolling walker (2 wheeled) Transfers: Sit to/from Stand Sit to Stand: Min assist General transfer comment: cues for hand placement. Ambulation/Gait Ambulation/Gait assistance: Min assist Ambulation Distance (Feet): 15 Feet (x 2) Assistive device: Rolling walker (2 wheeled) General Gait Details: Assist for balance and support and verbal cues to stay with walker and not leave it behind. Gait Pattern/deviations: Decreased step length - right, Decreased step length - left, Decreased dorsiflexion - left (lt knee flexed in stance) Gait velocity: decr Gait velocity interpretation: Below normal speed for age/gender    ADL: ADL Overall ADL's : Needs assistance/impaired Grooming: Wash/dry face, Min guard, Standing Upper Body Dressing : Set up, Supervision/safety, Sitting Lower Body Dressing: Moderate assistance, Sit to/from stand Toilet Transfer: Minimal assistance, Min guard, RW, Ambulation (chair) Functional mobility during ADLs: Minimal assistance, Rolling walker, Min guard General ADL Comments: Pt at sink and washed face and then he reports he needs to go to the bathroom and was urinating on floor. Pt kept trying to walk in bathroom despite OT telling him to wait.    Cognition: Cognition Overall Cognitive Status: Impaired/Different from baseline Orientation Level: Oriented to person Cognition Arousal/Alertness: Awake/alert Behavior During Therapy: Impulsive, Flat affect Overall Cognitive Status: Impaired/Different from baseline Area of Impairment: Problem solving, Following commands, Safety/judgement, Attention, Orientation Orientation Level: Disoriented to, Place (initially) Current Attention Level: Sustained Memory: Decreased short-term memory Following Commands: Follows one step commands inconsistently Safety/Judgement: Decreased awareness of safety, Decreased awareness of deficits Problem Solving: Slow processing, Requires verbal cues General Comments: Pt feels like he is almost back to normal and that he wouldn't have any problems getting up by himself and walking despite not being able to maintain dynamic sitting balance on the EOB.  Blood pressure 162/97, pulse 70, temperature 99.1 F (37.3 C), temperature source Oral, resp. rate 18, height 6' 2.4" (1.89 m), weight 117.255 kg (258 lb 8 oz), SpO2 98 %. Physical Exam  HENT:  Head: Normocephalic.  Eyes: EOM are normal.  Neck: Normal range of motion. Neck supple. No thyromegaly present.  Cardiovascular: Normal rate and regular rhythm.   Respiratory: Effort normal and breath sounds normal. No respiratory distress.  GI: Soft. Bowel sounds are normal. He exhibits no distension.  Neurological:  Mood is flat but appropriate. He makes good eye contact with examiner. He is oriented to person, place and time. Fair awareness of deficits. Follows simple commands. RUE 3+/5. LUE 2/5. RLE 3 to 3+. LLE: 2/5. Slow to arouse. Senses pain in all 4's.   Skin: Skin is warm and dry.  Psychiatric:  flat    Results for orders placed or performed during the hospital encounter of 08/18/14 (from the past 24 hour(s))  Glucose, capillary     Status: Abnormal   Collection Time: 08/19/14  7:59 AM  Result Value Ref  Range   Glucose-Capillary 195 (H) 65 - 99 mg/dL  Glucose, capillary     Status: Abnormal   Collection Time: 08/19/14 11:38 AM  Result Value Ref Range   Glucose-Capillary 258 (H) 65 - 99 mg/dL  Glucose, capillary     Status: Abnormal   Collection Time: 08/19/14  5:04 PM  Result Value Ref Range   Glucose-Capillary 153 (H) 65 - 99 mg/dL  Basic metabolic panel     Status: Abnormal   Collection Time: 08/19/14  6:17 PM  Result Value Ref Range   Sodium 134 (L) 135 - 145 mmol/L   Potassium 3.0 (L) 3.5 - 5.1 mmol/L   Chloride 99 (L) 101 - 111 mmol/L   CO2 24 22 - 32 mmol/L   Glucose, Bld 174 (H) 65 - 99 mg/dL   BUN 5 (L) 6 - 20 mg/dL   Creatinine, Ser 0.95 0.61 - 1.24 mg/dL   Calcium 8.9 8.9 - 10.3 mg/dL   GFR calc non Af Amer >60 >60 mL/min   GFR calc Af Amer >60 >60 mL/min   Anion gap 11 5 - 15  Glucose, capillary     Status: Abnormal   Collection Time: 08/19/14 10:07 PM  Result Value Ref Range   Glucose-Capillary 155 (H) 65 - 99 mg/dL   Dg Chest 2 View  08/18/2014   CLINICAL DATA:  [place] twice today. Late on ground for 1 hour. Leans to the left. Slurred speech this morning. Elevated blood pressure.  EXAM: CHEST  2 VIEW  COMPARISON:  None.  FINDINGS: Shallow inspiration. The heart size and mediastinal contours are within normal limits. Both lungs are clear. 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  IMPRESSION: No active cardiopulmonary disease.   Electronically Signed   By: Lucienne Capers M.D.   On: 08/18/2014 21:08   Ct Head Wo Contrast  08/18/2014   CLINICAL DATA:  Status post fall. Laid on ground for more than 1 hour. Left-sided lean and slurred speech. Initial encounter.  EXAM: CT HEAD WITHOUT CONTRAST  TECHNIQUE: Contiguous axial images were obtained from the base of the skull through the vertex without intravenous contrast.  COMPARISON:  None.  FINDINGS: There appears to be an evolving small acute infarct at the high  medial right frontal lobe. There is no evidence of hemorrhagic transformation. A small adjacent punctate infarct is noted at the high right frontal lobe, possibly reflecting an embolic event. No mass lesion is seen. There is no evidence of midline shift.  Cerebellar atrophy is noted. Mild periventricular white matter change likely reflects small vessel ischemic microangiopathy. A small chronic lacunar infarct is noted at the right basal ganglia.  The brainstem and fourth ventricle are within normal limits. The third and lateral ventricles are unremarkable in appearance. The cerebral hemispheres are symmetric in appearance, with normal gray-white differentiation.  There is no evidence of fracture; visualized osseous structures are unremarkable in appearance. The orbits are within normal limits. The paranasal sinuses and mastoid air cells are well-aerated. No significant soft tissue abnormalities are seen.  IMPRESSION: 1. 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 Small adjacent punctate infarct also seen at the high right frontal lobe, possibly reflecting an underlying  embolic event. No evidence of hemorrhagic transformation at this time. 2. Cerebellar atrophy noted. 3. Mild small vessel ischemic microangiopathy. 4. Small chronic lacunar infarct at the right basal ganglia.  These results were called by telephone at the time of interpretation on 08/18/2014 at 9:08 pm to [facility] PA, who verbally acknowledged these results.   Electronically Signed   By: Garald Balding M.D.   On: 08/18/2014 21:08   Mr Brain Wo Contrast  08/19/2014   CLINICAL DATA:  Initial evaluation for acute left leg weakness and gait instability.  EXAM: MRI HEAD WITHOUT CONTRAST  MRA HEAD WITHOUT CONTRAST  TECHNIQUE: Multiplanar, multiecho pulse sequences of the brain and surrounding structures were obtained without intravenous contrast. Angiographic images of the head were obtained using MRA technique without contrast.   COMPARISON:  Prior CT from earlier the same day.  FINDINGS: MRI HEAD FINDINGS  Diffuse prominence of the CSF containing spaces is compatible with generalized age-related cerebral atrophy. Mild patchy T2/FLAIR hyperintensity within the periventricular and deep white matter both cerebral hemispheres noted, most consistent with chronic small vessel ischemic changes, fairly mild for patient age. Probable small remote lacunar infarcts within the bilateral basal ganglia.  There is abnormal restricted diffusion involving the parasagittal right frontal lobe, consistent with acute right ACA territory infarct. There is involvement of the right body of the corpus callosum. Additional patchy cortical infarcts present within the anterior right frontal lobe as well as the right parietal lobe, also likely right ACA in distribution. No significant mass effect. There are associated scattered areas of petechial hemorrhage within the area of infarction.  No mass lesion, mass effect, or midline shift.  No hydrocephalus.  Craniocervical junction within normal limits. Pituitary gland normal.  No acute abnormality about the orbits.  Mild mucosal thickening present within left maxillary and left sphenoid sinuses. Paranasal sinuses are otherwise clear. No mastoid effusion. Inner ear structures normal.  Bone marrow signal intensity within normal limits. Scalp soft tissues unremarkable. Probable intramuscular lipoma noted within the right posterior neck.  MRA HEAD FINDINGS  ANTERIOR CIRCULATION:  Study is degraded by motion artifact.  Distal cervical segments of the internal carotid arteries are widely patent with antegrade flow. The petrous cavernous, and supra clinoid segments are widely patent. A1 segments are well opacified. Anterior communicating artery normal.  There is multi focal irregularity and stenosis within the right A2 segment which is overall diminutive as compared to the left. Additionally, there is suggestion of either  occlusive or near occlusive thrombus within the right A2 segment with blooming artifact seen in this region on gradient echo sequence (series 2 10, image 72). There is faint opacification of the right A2 segment distally. The left A2 segment demonstrates to focal moderate to severe stenoses, 1 at its base near the anterior communicating artery, the other more distally within the left A2 segment (Series 603, image 4).  There is a focal high-grade short-segment stenosis within the mid right M1 segment (series 602, image 7). Left M1 segment widely patent. MCA bifurcations within normal limits. Probable short-segment moderate stenosis within an inferior left M2 branch (series 606, image 12). Scattered atheromatous irregularity seen throughout the MCA branches.  POSTERIOR CIRCULATION:  Left vertebral artery is dominant and widely patent to the vertebrobasilar junction. Multi focal irregularity seen within the diminutive right vertebral artery which appears to terminate in PICA. Mild to moderate multi focal atheromatous irregularity present within the basilar artery which is somewhat narrowed at its mid and distal aspects. Superior cerebellar  arteries are opacified proximally. There appears to be fetal origin of the right PCA with a widely patent right posterior communicating artery. Left P1 segment widely patent. Distal left PCA branches are attenuated as compared to the right with multi focal atheromatous irregularity. There may be chronic occlusion of the distal left PCA branches given the absence of acute infarct in this territory on today's study.  No aneurysm or vascular malformation.  IMPRESSION: MRI HEAD IMPRESSION:  1. Acute right ACA territory infarct involving the parasagittal right frontal lobe, body of the right corpus callosum, with additional patchy cortical infarcts involving the right frontal and parietal cortex. There are scattered foci of associated petechial hemorrhage without frank hemorrhagic  conversion. No significant mass effect. 2. Mild age-related generalized cerebral atrophy with chronic small vessel ischemic disease and small remote lacunar infarcts involving the bilateral basal ganglia.  MRA HEAD IMPRESSION:  1. Occlusive versus near occlusive thrombus within the right A2 segment, compatible with right ACA territory infarct. 2. Short-segment high-grade stenosis within the right M1 segment. 3. Additional multi focal atheromatous irregularity with stenoses throughout the intracranial circulation as detailed above.   Electronically Signed   By: Jeannine Boga M.D.   On: 08/19/2014 01:58   [name] Head/brain Wo Cm  08/19/2014   CLINICAL DATA:  Initial evaluation for acute left leg weakness and gait instability.  EXAM: MRI HEAD WITHOUT CONTRAST  MRA HEAD WITHOUT CONTRAST  TECHNIQUE: Multiplanar, multiecho pulse sequences of the brain and surrounding structures were obtained without intravenous contrast. Angiographic images of the head were obtained using MRA technique without contrast.  COMPARISON:  Prior CT from earlier the same day.  FINDINGS: MRI HEAD FINDINGS  Diffuse prominence of the CSF containing spaces is compatible with generalized age-related cerebral atrophy. Mild patchy T2/FLAIR hyperintensity within the periventricular and deep white matter both cerebral hemispheres noted, most consistent with chronic small vessel ischemic changes, fairly mild for patient age. Probable small remote lacunar infarcts within the bilateral basal ganglia.  There is abnormal restricted diffusion involving the parasagittal right frontal lobe, consistent with acute right ACA territory infarct. There is involvement of the right body of the corpus callosum. Additional patchy cortical infarcts present within the anterior right frontal lobe as well as the right parietal lobe, also likely right ACA in distribution. No significant mass effect. There are associated scattered areas of petechial hemorrhage within  the area of infarction.  No mass lesion, mass effect, or midline shift.  No hydrocephalus.  Craniocervical junction within normal limits. Pituitary gland normal.  No acute abnormality about the orbits.  Mild mucosal thickening present within left maxillary and left sphenoid sinuses. Paranasal sinuses are otherwise clear. No mastoid effusion. Inner ear structures normal.  Bone marrow signal intensity within normal limits. Scalp soft tissues unremarkable. Probable intramuscular lipoma noted within the right posterior neck.  MRA HEAD FINDINGS  ANTERIOR CIRCULATION:  Study is degraded by motion artifact.  Distal cervical segments of the internal carotid arteries are widely patent with antegrade flow. The petrous cavernous, and supra clinoid segments are widely patent. A1 segments are well opacified. Anterior communicating artery normal.  There is multi focal irregularity and stenosis within the right A2 segment which is overall diminutive as compared to the left. Additionally, there is suggestion of either occlusive or near occlusive thrombus within the right A2 segment with blooming artifact seen in this region on gradient echo sequence (series 2 10, image 72). There is faint opacification of the right A2 segment distally. The left  A2 segment demonstrates to focal moderate to severe stenoses, 1 at its base near the anterior communicating artery, the other more distally within the left A2 segment (Series 603, image 4).  There is a focal high-grade short-segment stenosis within the mid right M1 segment (series 602, image 7). Left M1 segment widely patent. MCA bifurcations within normal limits. Probable short-segment moderate stenosis within an inferior left M2 branch (series 606, image 12). Scattered atheromatous irregularity seen throughout the MCA branches.  POSTERIOR CIRCULATION:  Left vertebral artery is dominant and widely patent to the vertebrobasilar junction. Multi focal irregularity seen within the diminutive  right vertebral artery which appears to terminate in PICA. Mild to moderate multi focal atheromatous irregularity present within the basilar artery which is somewhat narrowed at its mid and distal aspects. Superior cerebellar arteries are opacified proximally. There appears to be fetal origin of the right PCA with a widely patent right posterior communicating artery. Left P1 segment widely patent. Distal left PCA branches are attenuated as compared to the right with multi focal atheromatous irregularity. There may be chronic occlusion of the distal left PCA branches given the absence of acute infarct in this territory on today's study.  No aneurysm or vascular malformation.  IMPRESSION: MRI HEAD IMPRESSION:  1. Acute right ACA territory infarct involving the parasagittal right frontal lobe, body of the right corpus callosum, with additional patchy cortical infarcts involving the right frontal and parietal cortex. There are scattered foci of associated petechial hemorrhage without frank hemorrhagic conversion. No significant mass effect. 2. Mild age-related generalized cerebral atrophy with chronic small vessel ischemic disease and small remote lacunar infarcts involving the bilateral basal ganglia.  MRA HEAD IMPRESSION:  1. Occlusive versus near occlusive thrombus within the right A2 segment, compatible with right ACA territory infarct. 2. Short-segment high-grade stenosis within the right M1 segment. 3. Additional multi focal atheromatous irregularity with stenoses throughout the intracranial circulation as detailed above.   Electronically Signed   By: Jeannine Boga M.D.   On: 08/19/2014 01:58    Assessment/Plan: Diagnosis: Right ACA infarct 1. Does the need for close, 24 hr/day medical supervision in concert with the patient's rehab needs make it unreasonable for this patient to be served in a less intensive setting? Yes 2. Co-Morbidities requiring supervision/potential complications: dm, htn 3. Due  to bladder management, bowel management, safety, medication administration, pain management and patient education, does the patient require 24 hr/day rehab nursing? No 4. Does the patient require coordinated care of a physician, rehab nurse, pt,ot to address physical and functional deficits in the context of the above medical diagnosis(es)? No Addressing deficits in the following areas: balance, endurance, locomotion, strength, transferring, bowel/bladder control, bathing, dressing, feeding, grooming, toileting, cognition and psychosocial support 5. Can the patient actively participate in an intensive therapy program of at least 3 hrs of therapy per day at least 5 days per week? Yes 6. The potential for patient to make measurable gains while on inpatient rehab is fair 7. Anticipated functional outcomes upon discharge from inpatient rehab are supervision  with PT, supervision with OT, supervision with SLP. 8. Estimated rehab length of stay to reach the above functional goals is: n/a 9. Does the patient have adequate social supports and living environment to accommodate these discharge functional goals? No 10. Anticipated D/C setting: Other 11. Anticipated post D/C treatments: N/A 12. Overall Rehab/Functional Prognosis: good  RECOMMENDATIONS: This patient's condition is appropriate for continued rehabilitative care in the following setting: SNF Patient has agreed to participate in  recommended program. Yes Note that insurance prior authorization may be required for reimbursement for recommended care.  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 Will now have care needs of his own after discharge. SNF is most appropriate option.     08/20/2014

## 2014-08-20 NOTE — CV Procedure (Addendum)
    PROCEDURE NOTE  Procedure:  Transesophageal echocardiogram Operator:  Fransico Him, MD Indications:  CVA Complications:Markedly elevated BP IV Meds:Versed 2mg , Lopressor 5mg  IV  Results: Normal LV size and function Normal RV size and function Mildly dilated LA with very small LA appendage with no thrombus Normal RA Normal TV Mildly calcified trileaflet AV with trivial AI Normal MV with mild MR PV not well visualized Lipomatous interatrial septum with no evidence of intracardiac shunt by colorflow doppler or agitated saline contrast injection. Normal thoracic and ascending thoracic aorta  The patient tolerated the procedure well.  BP did increased and Lopressor 2.5mg  IV was given for BP 189/151mmHg.  Patient had significant snoring with apneic episodes - consider outpatient sleep study.  Signed: Fransico Him, MD [facility] 08/20/2014

## 2014-08-20 NOTE — Progress Notes (Signed)
Spoke with son [name] about getting updates on his dad via phone at night. Katie RN at bedside to address this with him. LM with Marshell Levan SW to speak with son [name] about questions about getting help for his bed bound mother at home that his dad will no longer be able to care for.

## 2014-08-20 NOTE — Clinical Social Work Note (Signed)
Clinical Social Work Assessment  Patient Details  Name: Juan [name] MRN: [id number] Date of Birth: 02-21-1951  Date of referral:  08/20/14               Reason for consult:  Discharge Planning, Facility Placement                Permission sought to share information with:  Family Supports, Customer service manager Permission granted to share information::  Yes, Verbal Permission Granted  Name::     Metallurgist::  SNFs  Relationship::  Son  Contact Information:  [phone]  Housing/Transportation Living arrangements for the past 2 months:  Single Family Home Source of Information:  Patient Patient Interpreter Needed:  None Criminal Activity/Legal Involvement Pertinent to Current Situation/Hospitalization:  No - Comment as needed Significant Relationships:  Adult Children, Spouse Lives with:  Spouse, Adult Children Do you feel safe going back to the place where you live?  Yes Need for family participation in patient care:  Yes (Comment)  Care giving concerns:  Per RNCM patient's son and the patient are concerned about the patient's wife remaining at home as the patient is the wife's care giver.    Social Worker assessment / plan:  CSW met with the patient at bedside to complete assessment. The patient was admitted from home where he lives with his wife and son Juan [name]. The patient's wife has progressing dementia and her care needs are increasing. The patient and son shared with RNCM that they feel the patient would be better off in a SNF. CSW has provided patient and patient's son with SNF list (this list was placed at bedside, son has been notified of this by message). CSW explained to the patient that the son will need to have the patient placed in SNF from home. CSW has attempted to have this conversation with son in person and through phone call (two messages left on his voicemail). CSW will not be able to assist with having the wife placed beyond giving the patient's son  list of SNF's and educating him on the process of having her placed in the community. CSW spoke with patient about SNF search/placement process and answered the patient's questions. The patient states that if he is not accepted by CIR, he would like to be placed at [facility] as this facility is close to his home in [place]. CSW will go ahead and make referrals and wait for patient's son to return calls.   Employment status:  Disabled (Comment on whether or not currently receiving Disability) Insurance information:  Medicare PT Recommendations:  Inpatient Rehab Consult Information / Referral to community resources:  [place]  Patient/Family's Response to care:  Patient does not express any issues with his care.  Patient/Family's Understanding of and Emotional Response to Diagnosis, Current Treatment, and Prognosis:  The patient is aware that he has had a stroke and understands that he will likely need continued therapy at discharge.  Emotional Assessment Appearance:  Appears stated age Attitude/Demeanor/Rapport:  Other (Appropriate) Affect (typically observed):  Sad, Calm, Flat Orientation:  Oriented to Self, Oriented to Place, Oriented to  Time, Oriented to Situation Alcohol / Substance use:  Tobacco Use, Alcohol Use Psych involvement (Current and /or in the community):  No (Comment)  Discharge Needs  Concerns to be addressed:  Discharge Planning Concerns Readmission within the last 30 days:  No Current discharge risk:  Physical Impairment Barriers to Discharge:  Continued Medical Work up  Liz Beach MSW, [place], [place], [phone]

## 2014-08-21 ENCOUNTER — Encounter (HOSPITAL_COMMUNITY): Payer: Self-pay | Admitting: Cardiology

## 2014-08-21 DIAGNOSIS — I6789 Other cerebrovascular disease: Secondary | ICD-10-CM | POA: Diagnosis not present

## 2014-08-21 DIAGNOSIS — Z9181 History of falling: Secondary | ICD-10-CM | POA: Diagnosis not present

## 2014-08-21 DIAGNOSIS — F1099 Alcohol use, unspecified with unspecified alcohol-induced disorder: Secondary | ICD-10-CM | POA: Diagnosis not present

## 2014-08-21 DIAGNOSIS — R278 Other lack of coordination: Secondary | ICD-10-CM | POA: Diagnosis not present

## 2014-08-21 DIAGNOSIS — R1312 Dysphagia, oropharyngeal phase: Secondary | ICD-10-CM | POA: Diagnosis not present

## 2014-08-21 DIAGNOSIS — I69320 Aphasia following cerebral infarction: Secondary | ICD-10-CM | POA: Diagnosis not present

## 2014-08-21 DIAGNOSIS — I69359 Hemiplegia and hemiparesis following cerebral infarction affecting unspecified side: Secondary | ICD-10-CM | POA: Diagnosis not present

## 2014-08-21 DIAGNOSIS — F329 Major depressive disorder, single episode, unspecified: Secondary | ICD-10-CM | POA: Diagnosis not present

## 2014-08-21 DIAGNOSIS — E118 Type 2 diabetes mellitus with unspecified complications: Secondary | ICD-10-CM | POA: Diagnosis not present

## 2014-08-21 DIAGNOSIS — M6281 Muscle weakness (generalized): Secondary | ICD-10-CM | POA: Diagnosis not present

## 2014-08-21 DIAGNOSIS — I480 Paroxysmal atrial fibrillation: Secondary | ICD-10-CM | POA: Diagnosis not present

## 2014-08-21 DIAGNOSIS — I10 Essential (primary) hypertension: Secondary | ICD-10-CM | POA: Diagnosis not present

## 2014-08-21 DIAGNOSIS — I472 Ventricular tachycardia: Secondary | ICD-10-CM | POA: Diagnosis not present

## 2014-08-21 DIAGNOSIS — E876 Hypokalemia: Secondary | ICD-10-CM | POA: Diagnosis not present

## 2014-08-21 DIAGNOSIS — I6350 Cerebral infarction due to unspecified occlusion or stenosis of unspecified cerebral artery: Secondary | ICD-10-CM | POA: Diagnosis not present

## 2014-08-21 LAB — BASIC METABOLIC PANEL
Anion gap: 13 (ref 5–15)
BUN: 9 mg/dL (ref 6–20)
CO2: 21 mmol/L — ABNORMAL LOW (ref 22–32)
Calcium: 8.8 mg/dL — ABNORMAL LOW (ref 8.9–10.3)
Chloride: 101 mmol/L (ref 101–111)
Creatinine, Ser: 0.9 mg/dL (ref 0.61–1.24)
Glucose, Bld: 139 mg/dL — ABNORMAL HIGH (ref 65–99)
Potassium: 3.8 mmol/L (ref 3.5–5.1)
Sodium: 135 mmol/L (ref 135–145)

## 2014-08-21 LAB — GLUCOSE, CAPILLARY
GLUCOSE-CAPILLARY: 151 mg/dL — AB (ref 65–99)
GLUCOSE-CAPILLARY: 139 mg/dL — AB (ref 65–99)
Glucose-Capillary: 150 mg/dL — ABNORMAL HIGH (ref 65–99)
Glucose-Capillary: 181 mg/dL — ABNORMAL HIGH (ref 65–99)

## 2014-08-21 LAB — HEMOGLOBIN A1C
Hgb A1c MFr Bld: 9.3 % — ABNORMAL HIGH (ref 4.8–5.6)
Mean Plasma Glucose: 220 mg/dL

## 2014-08-21 LAB — MAGNESIUM: MAGNESIUM: 1.8 mg/dL (ref 1.7–2.4)

## 2014-08-21 MED ORDER — METOPROLOL TARTRATE 50 MG PO TABS: 50.0000 mg | ORAL_TABLET | Freq: Two times a day (BID) | ORAL | Status: DC

## 2014-08-21 MED ORDER — LISINOPRIL 20 MG PO TABS: 20.0000 mg | ORAL_TABLET | Freq: Every day | ORAL | Status: DC

## 2014-08-21 MED ORDER — INSULIN ASPART 100 UNIT/ML ~~LOC~~ SOLN: SUBCUTANEOUS | Status: AC

## 2014-08-21 MED ORDER — ASPIRIN 325 MG PO TABS
325.0000 mg | ORAL_TABLET | Freq: Every day | ORAL | Status: DC
Start: 2014-08-21 — End: 2015-06-30

## 2014-08-21 MED ORDER — METOPROLOL TARTRATE 50 MG PO TABS
50.0000 mg | ORAL_TABLET | Freq: Two times a day (BID) | ORAL | Status: DC
  Filled 2014-08-21 (×2): qty 1

## 2014-08-21 MED ORDER — INSULIN GLARGINE 100 UNIT/ML ~~LOC~~ SOLN: 10.0000 [IU] | Freq: Every day | SUBCUTANEOUS | Status: AC

## 2014-08-21 MED ORDER — MAGNESIUM OXIDE 400 (241.3 MG) MG PO TABS
400.0000 mg | ORAL_TABLET | Freq: Two times a day (BID) | ORAL | Status: DC
  Administered 2014-08-21: 400 mg via ORAL
  Filled 2014-08-21 (×2): qty 1

## 2014-08-21 MED ORDER — METOPROLOL TARTRATE 25 MG PO TABS
25.0000 mg | ORAL_TABLET | Freq: Once | ORAL | Status: AC
  Administered 2014-08-21: 25 mg via ORAL
  Filled 2014-08-21: qty 1

## 2014-08-21 MED ORDER — MAGNESIUM OXIDE 400 (241.3 MG) MG PO TABS: 400.0000 mg | ORAL_TABLET | Freq: Two times a day (BID) | ORAL | Status: AC

## 2014-08-21 NOTE — Progress Notes (Signed)
STROKE TEAM PROGRESS NOTE   HISTORY Juan [name] is an 64 y.o. male with hx of HTN, HLD, DM, paroxysmal A fib presenting with falls x 2 and gait instability. Symptoms started around 10am this morning when patient fell into a ditch. At that time noted to have left leg weakness and gait instability. Symptoms persisted and he had another fall so family brought him to the ED. CT head imaging reviewed, shows right frontal evolving infarct and question of small punctate infarct in the high right frontal lobe. Unclear A fib history, notes document it was paroxysmal and post  Gall bladder procedure, current EKG and tele shows irregular rhythm but does not appear to be A fib. Patient was last known well 08/18/2014 at 1000. Patient was not administered TPA secondary to outside of tPA window. He was admitted for further evaluation and treatment.   SUBJECTIVE (INTERVAL HISTORY) Patient  Had TEE y`day which was unremarkable. No new complaints.     OBJECTIVE Temp:  [97.9 F (36.6 C)-99.2 F (37.3 C)] 97.9 F (36.6 C) (06/16 0541) Pulse Rate:  [63-116] 99 (06/16 1213) Cardiac Rhythm:  [-]  Resp:  [18-20] 20 (06/16 0541) BP: (167-193)/(98-126) 167/106 mmHg (06/16 1213) SpO2:  [95 %-99 %] 95 % (06/16 0543)   Recent Labs Lab 08/20/14 1947 08/21/14 0100 08/21/14 0539 08/21/14 0804 08/21/14 1115  GLUCAP 165* 151* 150* 139* 181*    Recent Labs Lab 08/18/14 2026 08/18/14 2056 08/19/14 1817 08/20/14 0620 08/20/14 1719 08/21/14 0623  NA 136 139 134* 132* 135 135  K 3.4* 3.5 3.0* 3.1* 3.8 3.8  CL 99* 99* 99* 99* 104 101  CO2 28  --  24 22 25  21*  GLUCOSE 264* 261* 174* 145* 152* 139*  BUN 8 8 5* 6 7 9   CREATININE 1.03 0.90 0.95 0.83 0.87 0.90  CALCIUM 9.2  --  8.9 8.5* 8.6* 8.8*  MG  --   --   --  1.7  --  1.8    Recent Labs Lab 08/18/14 2026  AST 19  ALT 13*  ALKPHOS 75  BILITOT 0.9  PROT 7.1  ALBUMIN 3.4*    Recent Labs Lab 08/18/14 2026 08/18/14 2056 08/20/14 0620   WBC 9.1  --  9.7  NEUTROABS 7.0  --   --   HGB 16.2 16.7 15.4  HCT 46.0 49.0 44.0  MCV 89.1  --  87.6  PLT 243  --  245    Recent Labs Lab 08/18/14 2026 08/20/14 1719  CKTOTAL 46*  --   TROPONINI  --  <0.03    Recent Labs  08/18/14 2026  LABPROT 14.5  INR 1.11    Recent Labs  08/19/14 0048  COLORURINE AMBER*  LABSPEC 1.020  PHURINE 6.5  GLUCOSEU >1000*  HGBUR NEGATIVE  BILIRUBINUR NEGATIVE  KETONESUR 15*  PROTEINUR NEGATIVE  UROBILINOGEN 0.2  NITRITE NEGATIVE  LEUKOCYTESUR NEGATIVE       Component Value Date/Time   CHOL 206* 08/20/2014 0620   TRIG 182* 08/20/2014 0620   HDL 35* 08/20/2014 0620   CHOLHDL 5.9 08/20/2014 0620   VLDL 36 08/20/2014 0620   LDLCALC 135* 08/20/2014 0620   Lab Results  Component Value Date   HGBA1C 9.3* 08/20/2014      Component Value Date/Time   LABOPIA NONE DETECTED 08/19/2014 0048   COCAINSCRNUR NONE DETECTED 08/19/2014 0048   LABBENZ NONE DETECTED 08/19/2014 0048   AMPHETMU NONE DETECTED 08/19/2014 0048   THCU NONE DETECTED 08/19/2014 0048  LABBARB NONE DETECTED 08/19/2014 0048     Recent Labs Lab 08/18/14 2026  ETH <5    Dg Chest 2 View 08/18/2014    No active cardiopulmonary disease.     Ct Head Wo Contrast 08/18/2014    1. 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 Small adjacent punctate infarct also seen at the high right frontal lobe, possibly reflecting an underlying embolic event. No evidence of hemorrhagic transformation at this time. 2. Cerebellar atrophy noted. 3. Mild small vessel ischemic microangiopathy. 4. Small chronic lacunar infarct at the right basal ganglia.    Mr Brain Wo Contrast 08/19/2014    1. Acute right ACA territory infarct involving the parasagittal right frontal lobe, body of the right corpus callosum, with additional patchy cortical infarcts involving the right frontal and parietal cortex. There are scattered foci of associated petechial hemorrhage without  frank hemorrhagic conversion. No significant mass effect. 2. Mild age-related generalized cerebral atrophy with chronic small vessel ischemic disease and small remote lacunar infarcts involving the bilateral basal ganglia.  [name] Head/brain Wo Cm 08/19/2014    1. Occlusive versus near occlusive thrombus within the right A2 segment, compatible with right ACA territory infarct. 2. Short-segment high-grade stenosis within the right M1 segment. 3. Additional multi focal atheromatous irregularity with stenoses throughout the intracranial circulation as detailed above.     2D Echocardiogram  - Left ventricle: The cavity size was normal. Wall thickness wasincreased in a pattern of mild LVH. Systolic function wasvigorous. The estimated ejection fraction was in the range of 65%to 70%. Wall motion was normal; there were no regional wallmotion abnormalities. - Left atrium: The atrium was mildly dilated. Impressions:  No cardiac source of emboli was indentified.   PHYSICAL EXAM Frail middle aged Caucasian male currently not in distress. . Afebrile. Head is nontraumatic. Neck is supple without bruit.    Cardiac exam no murmur or gallop. Lungs are clear to auscultation. Distal pulses are well felt. Neurological Exam :  Awake alert oriented 3 normal speech and language. No aphasia or apraxia dysarthria. Extraocular movements are full range with only a few beats of end gaze nystagmus on lateral gaze bilaterally. Fundi were not visualized. Vision acuity seems adequate. Mild left lower facial asymmetry. Tongue is midline. Motor system exam revealed mild left lower extremity drift. Weakness of left grip, intrinsic hand muscles and left hip flexors and ankle dorsiflexors. Diminished fine finger movements on the left. Orbits right over left approximately. Coordination is slightly impaired on the left. DTrs are 2+ symmetric. Sensation is preserved bilaterally. Plantars are downgoing. Gait was not  tested. ASSESSMENT/PLAN Juan [name] is a 64 y.o. male with history of HTN, HLD, DM, paroxysmal A fib presenting with falls x 2 and gait instability. He did not receive IV t-PA due to delay in arrival.   Stroke:  Non-dominant right ACA infarcts, embolic secondary to unknown source, possible atrial fibrillation given hx of post-chole atrial fibrillation   MRI  Patchy acute R ACA infarcts  MRA  Right A2 occlusion versus thrombus. Right M1 high-grade stenosis. Intracranial atheromatous stenoses throughout.  Carotid Doppler 1-39 % bilateral carotid stenosis  2D Echo  No source of emboli  TEE no source of embolus  [place] electrophysiologist will consult and consider placement of an implantable loop recorder to evaluate for atrial fibrillation as etiology of stroke. This has been explained to patient/family by Dr. Leonie Man and they are agreeable.   LDL 135  HgbA1c 9.2 in March 2016  Heparin 5000 units sq tid for VTE prophylaxis Diet Heart Room service appropriate?: Yes; Fluid consistency:: Thin  aspirin 81 mg orally every day prior to admission, now on aspirin 325 mg orally every day  Patient counseled to be compliant with his antithrombotic medications  Ongoing aggressive stroke risk factor management  Therapy recommendations:  CIR. Consult requested  Disposition:  pending   Hx paroxsymal atrial fibrillation  Post cholecystectomy in 2012  Follow up Holter with NSR, some AT. Hochrein followed  Dr. Leonie Man called and discussed with Dr. Caryl Comes today, on call for EP. No precedence for anticoagulation in this situation  TEE neg for embolic source.   loop placement eval by EP arrnaged  Malignant hypertension  Home meds:   lisinopril-hydrochlorothiazide  Unstable. Elevated.  Permissive hypertension (OK if < 220/120) but gradually normalize in 5-7 days   Hyperlipidemia  Home meds:  lipitor 80, resumed in hospital  LDL 135, goal <  70  Continue statin at discharge  Diabetes type II  HgbA1c 9.2 in March 2016, goal < 7.0  Other Stroke Risk Factors  Advanced age  Pipe smoker, advised to stop smoking  ETOH use  Obesity, Body mass index is 32.83 kg/(m^2).   Hospital day # [place] for Pager information 08/21/2014 12:29 PM   I have personally examined this patient, reviewed notes, independently viewed imaging studies, participated in medical decision making and plan of care. I have made any additions or clarifications directly to the above note. Recommend 30 day heart monitor per EP service prior to consideration for LOOP given prior history of paroxysmal atrial fibrillation in the past and embolic stroke at present. Continue aspirin. Stroke team will sign off. Kindly call for questions.  Antony Contras, MD Medical Director [facility] Stroke Center Pager: [phone] 08/21/2014 12:29 PM    To contact Stroke Continuity provider, please refer to [email or web address] After hours, contact General Neurology

## 2014-08-21 NOTE — Progress Notes (Signed)
Rehab admissions - Evaluated for possible admission.  Please see rehab consult done today recommending SNF.  Patient was the caregiver for his wife and he will now have needs of his own after discharge.  Call me for questions.  #[phone]

## 2014-08-21 NOTE — Evaluation (Signed)
Speech Language Pathology Evaluation Patient Details Name: Juan [name] MRN: [id number] DOB: 29-Jan-1951 Today's Date: 08/21/2014 Time: 7622-6333 SLP Time Calculation (min) (ACUTE ONLY): 20 min  Problem List:  Patient Active Problem List   Diagnosis Date Noted  . NSVT (nonsustained ventricular tachycardia)   . Acute ischemic stroke 08/18/2014  . Increased prostate specific antigen (PSA) velocity 02/25/2011  . Preventative health care 07/18/2010  . Atrial fibrillation 03/09/2010  . 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 Hx of colonic polyps 05/30/2008  . Diabetes 07/27/2007  . Hyperlipidemia 01/19/2007  . GOUT 12/01/2006  . Essential hypertension 12/01/2006  . OBESITY, HX OF 12/01/2006   Past Medical History:  Past Medical History  Diagnosis Date  . Hypertension   . Gout   . Obesity   . Hyperlipidemia   . Diabetes mellitus     type II  . Paroxysmal atrial fibrillation     postoperatively  . DIABETES MELLITUS, TYPE II 07/27/2007  . GOUT 12/01/2006  . HYPERLIPIDEMIA 01/19/2007  . HYPERTENSION 12/01/2006  . 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 OBESITY, HX OF 12/01/2006  . Cataract   . Hx of colonic polyps 05/30/2008   Past Surgical History:  Past Surgical History  Procedure Laterality Date  . Cholecystectomy      Laparoscopic  . Cataract extraction      bilateral  . Colonoscopy    . Tee without cardioversion N/A 08/20/2014    Procedure: TRANSESOPHAGEAL ECHOCARDIOGRAM (TEE);  Surgeon: Sueanne Margarita, MD;  Location: [facility] ENDOSCOPY;  Service: Cardiovascular;  Laterality: N/A;  PT NEED A LOOP   HPI:  Pt is a 64 y.o. male with PMH of hypertension, diabetes mellitus, paroxysmal fibrillation, hypertension, hyperlipidemia presenting to the ED 6/13 With LLE weakenss and falling. MRI 6/13 revealed acute R ACA infarct involving the parasagittal R frontal lobe, body of R corpus callosum, with additional patchy cortical infarts involving R frontal and parietal cortex. Speech  language eval ordered as part of stroke workup.   Assessment / Plan / Recommendation Clinical Impression  Pt demonstrating motor speech skills and receptive language skills within normal limits. Pt currently presenting with mild cognitive/ expressive language deficits including decreased awareness of deficits, complex problem solving, short term memory/ recall of new information, convergent naming, and overall flat affect and short responses in conversation. Pt complains of difficulties projecting voice. Pt would benefit from continued speech therapyto address language/ cognitive deficits to increase safety and quality of life.     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)   Cognition  Overall Cognitive Status: Impaired/Different from baseline Arousal/Alertness: Awake/alert Orientation Level: Oriented to person;Oriented to place;Oriented to situation;Disoriented to time Attention: Selective Selective Attention: Impaired Selective Attention Impairment: Verbal complex Memory: Impaired Memory Impairment: Decreased recall of new information;Decreased short term memory Decreased Short Term Memory: Verbal basic Awareness: Impaired Problem Solving: Impaired Problem Solving Impairment: Verbal complex;Functional complex Behaviors: Other (comment) (flat affect, fell asleep 2 times) Safety/Judgment: Impaired    Comprehension  Auditory Comprehension Overall Auditory Comprehension: Appears within functional limits for tasks assessed Yes/No Questions: Within Functional Limits Commands:  Within Functional Limits Conversation: Complex  Expression Expression Primary Mode of Expression: Verbal Verbal Expression Overall Verbal Expression: Impaired Initiation: No impairment Level of Generative/Spontaneous Verbalization: Conversation Repetition: No impairment Naming: No impairment Pragmatics: Impairment Impairments: Abnormal affect;Monotone;Other (comment) (short responses to questions) Non-Verbal Means of Communication: Not applicable   Oral / Motor Oral Motor/Sensory Function Overall Oral Motor/Sensory Function: Appears within functional limits for tasks assessed Motor Speech Overall Motor Speech: Appears within functional limits for tasks assessed   GO     Juan Reap, MA, CCC-SLP 08/21/2014, 2:05 PM

## 2014-08-21 NOTE — Clinical Social Work Placement (Signed)
   CLINICAL SOCIAL WORK PLACEMENT  NOTE  Date:  08/21/2014  Patient Details  Name: Juan [name] MRN: [id number] Date of Birth: 02-01-1951  Clinical Social Work is seeking post-discharge placement for this patient at the [place] level of care (*CSW will initial, date and re-position this form in  chart as items are completed):  Yes   Patient/family provided with [place] Work Department's list of facilities offering this level of care within the geographic area requested by the patient (or if unable, by the patient's family).  Yes   Patient/family informed of their freedom to choose among providers that offer the needed level of care, that participate in Medicare, Medicaid or managed care program needed by the patient, have an available bed and are willing to accept the patient.  Yes   Patient/family informed of [facility]'s ownership interest in [facility] and [facility], as well as of the fact that they are under no obligation to receive care at these facilities.  PASRR submitted to EDS on 08/21/14     PASRR number received on 08/21/14     Existing PASRR number confirmed on       FL2 transmitted to all facilities in geographic area requested by pt/family on 08/21/14     FL2 transmitted to all facilities within larger geographic area on       Patient informed that his/her managed care company has contracts with or will negotiate with certain facilities, including the following:            Patient/family informed of bed offers received.  Patient chooses bed at       Physician recommends and patient chooses bed at      Patient to be transferred to   on  .  Patient to be transferred to facility by       Patient family notified on   of transfer.  Name of family member notified:        PHYSICIAN       Additional Comment:    _______________________________________________ Rigoberto Noel, LCSW 08/21/2014, 8:41 AM

## 2014-08-21 NOTE — Discharge Summary (Signed)
[facility]  Physician Discharge Summary   Patient ID: Juan [name] MRN: [id number] DOB/AGE: Apr 09, 1950 64 y.o.  Admit date: 08/18/2014 Discharge date: 08/21/2014  PCP: Cathlean Cower, MD  DISCHARGE DIAGNOSES:  Principal Problem:   Acute ischemic stroke Active Problems:   Diabetes   Hyperlipidemia   Essential hypertension   Atrial fibrillation   NSVT (nonsustained ventricular tachycardia)   RECOMMENDATIONS FOR OUTPATIENT FOLLOW UP: 1. Please monitor patient's blood pressure on a daily basis. If patient's blood pressure do not improve over the next 2-3 days please increase lisinopril to 40 mg daily or add amlodipine starting at 5 mg daily. 2. Please check blood glucose levels before meals and at bedtime 3. Check basic metabolic panel and magnesium level on Monday, June 20   DISCHARGE CONDITION: fair  Diet recommendation: Modified carbohydrate  Filed Weights   08/18/14 2217  Weight: 117.255 kg (258 lb 8 oz)    INITIAL HISTORY: 64 year old male with a history of hypertension, diabetes mellitus, paroxysmal fibrillation, hypertension, hyperlipidemia presented with left leg weakness resulting in mechanical fall 2 on the day prior to admission. After his second fall, the patient was found by his son on the floor and EMS was activated. Workup in the emergency department including CT of the brain showed an acute right frontal stroke. MRI of the brain confirmed a right ACA infarct. Neurology was consulted and continues to follow the patient. Notably, the patient stated that he has not taken any of his medications for the past 2-3 months. He had been under a lot of stress taking care of his wife who has advanced dementia.  Consultations:  Neurology  Procedures:   HOSPITAL COURSE:   Acute ischemic stroke MRI brain shows acute right PCA infarct with a scattered petechial hemorrhages. 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 Patient was seen by neurology. He  recommended addressing his risk factors. After multiple discussions regarding aspirin versus anticoagulation. Aspirin has been recommended. Patient underwent stroke workup including carotid Dopplers and echocardiogram and TEE. A 30 day event monitor is to be arranged by cardiology. Loop recorder to be placed this 30 day event monitor is inconclusive. Patient was seen by physical and occupational therapy. Inpatient rehabilitation was recommended. Patient was seen by the rehabilitation consultant recommends skilled nursing facility placement. Asian will remain on aspirin. Also, on statin. Hemoglobin A1c 9.2 in March. Lipid panel reveals LDL of 135. Carotid duplex shows bilateral 1-39 percent ICA stenosis. Vertebral artery flow is antegrade. TEE on 6/15 does not show any clots. 30 day event monitor to be arranged as an outpatient.  History of postoperative paroxysmal atrial fibrillation  CHADSVASc= 4. Neurology has discussed the issue of anticoagulation with cardiology. At this time plan is to continue aspirin alone. If the 30 day event monitor or loop recorder reveals atrial fibrillation, at that time consideration will be given to initiating anticoagulation.  Diabetes mellitus type 2, poorly controlled Patient has been off of his medications for over 2 months. Continue Lantus 10 units at bedtime along with sliding scale coverage. Monitor CBGs and adjust accordingly. Discontinued metformin and glipizide for now  History of essential Hypertension  Again, patient has been off of his antihypertensives medications for over 2 months. Patient was slowly started back on antihypertensives regimen allowing for permissive hypertension needed due to his stroke. Metoprolol was added due to presence of PVCs as discussed below. Blood pressure still remains elevated. This should be controlled over 1-2 weeks. Please see recommendations above.  Hypokalemia and hypomagnesemia This  was aggressively repleted due to presence  of PVCs and nonsustained ventricular tachycardias.   NSVT Frequent PVCs and nonsustained VT's is noted on telemetry. Patient remained asymptomatic. EKG shows nonspecific T-wave changes similar to previous. No concerning ST changes noted. His recent echocardiogram shows normal systolic function. He was noted to be hypokalemic and had borderline hypomagnesemia as well. This was likely contributing to the episodes of PVCs. After his electrolytes were corrected. The frequency of PVCs reduced. Continue with beta blocker. Patient will also get an event monitor which will also revealed the burden of nonsustained ventricle tachycardia and PVCs.  Patient remains stable. He will be discharged to skilled nursing facility.  PERTINENT LABS:  The results of significant diagnostics from this hospitalization (including imaging, microbiology, ancillary and laboratory) are listed below for reference.     Labs: Basic Metabolic Panel:  Recent Labs Lab 08/18/14 2026 08/18/14 2056 08/19/14 1817 08/20/14 0620 08/20/14 1719 08/21/14 0623  NA 136 139 134* 132* 135 135  K 3.4* 3.5 3.0* 3.1* 3.8 3.8  CL 99* 99* 99* 99* 104 101  CO2 28  --  24 22 25  21*  GLUCOSE 264* 261* 174* 145* 152* 139*  BUN 8 8 5* 6 7 9   CREATININE 1.03 0.90 0.95 0.83 0.87 0.90  CALCIUM 9.2  --  8.9 8.5* 8.6* 8.8*  MG  --   --   --  1.7  --  1.8   Liver Function Tests:  Recent Labs Lab 08/18/14 2026  AST 19  ALT 13*  ALKPHOS 75  BILITOT 0.9  PROT 7.1  ALBUMIN 3.4*   CBC:  Recent Labs Lab 08/18/14 2026 08/18/14 2056 08/20/14 0620  WBC 9.1  --  9.7  NEUTROABS 7.0  --   --   HGB 16.2 16.7 15.4  HCT 46.0 49.0 44.0  MCV 89.1  --  87.6  PLT 243  --  245   Cardiac Enzymes:  Recent Labs Lab 08/18/14 2026 08/20/14 1719  CKTOTAL 46*  --   TROPONINI  --  <0.03   CBG:  Recent Labs Lab 08/20/14 1947 08/21/14 0100 08/21/14 0539 08/21/14 0804 08/21/14 1115  GLUCAP 165* 151* 150* 139* 181*     IMAGING  STUDIES Dg Chest 2 View  08/18/2014   CLINICAL DATA:  [place] twice today. Late on ground for 1 hour. Leans to the left. Slurred speech this morning. Elevated blood pressure.  EXAM: CHEST  2 VIEW  COMPARISON:  None.  FINDINGS: Shallow inspiration. The heart size and mediastinal contours are within normal limits. Both lungs are clear. 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  IMPRESSION: No active cardiopulmonary disease.   Electronically Signed   By: Lucienne Capers M.D.   On: 08/18/2014 21:08   Ct Head Wo Contrast  08/18/2014   CLINICAL DATA:  Status post fall. Laid on ground for more than 1 hour. Left-sided lean and slurred speech. Initial encounter.  EXAM: CT HEAD WITHOUT CONTRAST  TECHNIQUE: Contiguous axial images were obtained from the base of the skull through the vertex without intravenous contrast.  COMPARISON:  None.  FINDINGS: There appears to be an evolving small acute infarct at the high medial right frontal lobe. There is no evidence of hemorrhagic transformation. A small adjacent punctate infarct is noted at the high right frontal lobe, possibly reflecting an embolic event. No mass lesion is seen. There is no evidence of midline shift.  Cerebellar atrophy is noted. Mild periventricular white matter change  likely reflects small vessel ischemic microangiopathy. A small chronic lacunar infarct is noted at the right basal ganglia.  The brainstem and fourth ventricle are within normal limits. The third and lateral ventricles are unremarkable in appearance. The cerebral hemispheres are symmetric in appearance, with normal gray-white differentiation.  There is no evidence of fracture; visualized osseous structures are unremarkable in appearance. The orbits are within normal limits. The paranasal sinuses and mastoid air cells are well-aerated. No significant soft tissue abnormalities are seen.  IMPRESSION: 1. Evolving small acute infarct at the  high medial right frontal lobe. Small adjacent punctate infarct also seen at the high right frontal lobe, possibly reflecting an underlying embolic event. No evidence of hemorrhagic transformation at this time. 2. Cerebellar atrophy noted. 3. Mild small vessel ischemic microangiopathy. 4. Small chronic lacunar infarct at the right basal ganglia.  These results were called by telephone at the time of interpretation on 08/18/2014 at 9:08 pm to [facility] PA, who verbally acknowledged these results.   Electronically Signed   By: Garald Balding M.D.   On: 08/18/2014 21:08   Mr Brain Wo Contrast  08/19/2014   CLINICAL DATA:  Initial evaluation for acute left leg weakness and gait instability.  EXAM: MRI HEAD WITHOUT CONTRAST  MRA HEAD WITHOUT CONTRAST  TECHNIQUE: Multiplanar, multiecho pulse sequences of the brain and surrounding structures were obtained without intravenous contrast. Angiographic images of the head were obtained using MRA technique without contrast.  COMPARISON:  Prior CT from earlier the same day.  FINDINGS: MRI HEAD FINDINGS  Diffuse prominence of the CSF containing spaces is compatible with generalized age-related cerebral atrophy. Mild patchy T2/FLAIR hyperintensity within the periventricular and deep white matter both cerebral hemispheres noted, most consistent with chronic small vessel ischemic changes, fairly mild for patient age. Probable small remote lacunar infarcts within the bilateral basal ganglia.  There is abnormal restricted diffusion involving the parasagittal right frontal lobe, consistent with acute right ACA territory infarct. There is involvement of the right body of the corpus callosum. Additional patchy cortical infarcts present within the anterior right frontal lobe as well as the right parietal lobe, also likely right ACA in distribution. No significant mass effect. There are associated scattered areas of petechial hemorrhage within the area of infarction.  No mass lesion, mass  effect, or midline shift.  No hydrocephalus.  Craniocervical junction within normal limits. Pituitary gland normal.  No acute abnormality about the orbits.  Mild mucosal thickening present within left maxillary and left sphenoid sinuses. Paranasal sinuses are otherwise clear. No mastoid effusion. Inner ear structures normal.  Bone marrow signal intensity within normal limits. Scalp soft tissues unremarkable. Probable intramuscular lipoma noted within the right posterior neck.  MRA HEAD FINDINGS  ANTERIOR CIRCULATION:  Study is degraded by motion artifact.  Distal cervical segments of the internal carotid arteries are widely patent with antegrade flow. The petrous cavernous, and supra clinoid segments are widely patent. A1 segments are well opacified. Anterior communicating artery normal.  There is multi focal irregularity and stenosis within the right A2 segment which is overall diminutive as compared to the left. Additionally, there is suggestion of either occlusive or near occlusive thrombus within the right A2 segment with blooming artifact seen in this region on gradient echo sequence (series 2 10, image 72). There is faint opacification of the right A2 segment distally. The left A2 segment demonstrates to focal moderate to severe stenoses, 1 at its base near the anterior communicating artery, the other more distally within  the left A2 segment (Series 603, image 4).  There is a focal high-grade short-segment stenosis within the mid right M1 segment (series 602, image 7). Left M1 segment widely patent. MCA bifurcations within normal limits. Probable short-segment moderate stenosis within an inferior left M2 branch (series 606, image 12). Scattered atheromatous irregularity seen throughout the MCA branches.  POSTERIOR CIRCULATION:  Left vertebral artery is dominant and widely patent to the vertebrobasilar junction. Multi focal irregularity seen within the diminutive right vertebral artery which appears to  terminate in PICA. Mild to moderate multi focal atheromatous irregularity present within the basilar artery which is somewhat narrowed at its mid and distal aspects. Superior cerebellar arteries are opacified proximally. There appears to be fetal origin of the right PCA with a widely patent right posterior communicating artery. Left P1 segment widely patent. Distal left PCA branches are attenuated as compared to the right with multi focal atheromatous irregularity. There may be chronic occlusion of the distal left PCA branches given the absence of acute infarct in this territory on today's study.  No aneurysm or vascular malformation.  IMPRESSION: MRI HEAD IMPRESSION:  1. Acute right ACA territory infarct involving the parasagittal right frontal lobe, body of the right corpus callosum, with additional patchy cortical infarcts involving the right frontal and parietal cortex. There are scattered foci of associated petechial hemorrhage without frank hemorrhagic conversion. No significant mass effect. 2. Mild age-related generalized cerebral atrophy with chronic small vessel ischemic disease and small remote lacunar infarcts involving the bilateral basal ganglia.  MRA HEAD IMPRESSION:  1. Occlusive versus near occlusive thrombus within the right A2 segment, compatible with right ACA territory infarct. 2. Short-segment high-grade stenosis within the right M1 segment. 3. Additional multi focal atheromatous irregularity with stenoses throughout the intracranial circulation as detailed above.   Electronically Signed   By: Jeannine Boga M.D.   On: 08/19/2014 01:58   [name] Head/brain Wo Cm  08/19/2014   CLINICAL DATA:  Initial evaluation for acute left leg weakness and gait instability.  EXAM: MRI HEAD WITHOUT CONTRAST  MRA HEAD WITHOUT CONTRAST  TECHNIQUE: Multiplanar, multiecho pulse sequences of the brain and surrounding structures were obtained without intravenous contrast. Angiographic images of the head were  obtained using MRA technique without contrast.  COMPARISON:  Prior CT from earlier the same day.  FINDINGS: MRI HEAD FINDINGS  Diffuse prominence of the CSF containing spaces is compatible with generalized age-related cerebral atrophy. Mild patchy T2/FLAIR hyperintensity within the periventricular and deep white matter both cerebral hemispheres noted, most consistent with chronic small vessel ischemic changes, fairly mild for patient age. Probable small remote lacunar infarcts within the bilateral basal ganglia.  There is abnormal restricted diffusion involving the parasagittal right frontal lobe, consistent with acute right ACA territory infarct. There is involvement of the right body of the corpus callosum. Additional patchy cortical infarcts present within the anterior right frontal lobe as well as the right parietal lobe, also likely right ACA in distribution. No significant mass effect. There are associated scattered areas of petechial hemorrhage within the area of infarction.  No mass lesion, mass effect, or midline shift.  No hydrocephalus.  Craniocervical junction within normal limits. Pituitary gland normal.  No acute abnormality about the orbits.  Mild mucosal thickening present within left maxillary and left sphenoid sinuses. Paranasal sinuses are otherwise clear. No mastoid effusion. Inner ear structures normal.  Bone marrow signal intensity within normal limits. Scalp soft tissues unremarkable. Probable intramuscular lipoma noted within the right posterior neck.  MRA HEAD FINDINGS  ANTERIOR CIRCULATION:  Study is degraded by motion artifact.  Distal cervical segments of the internal carotid arteries are widely patent with antegrade flow. The petrous cavernous, and supra clinoid segments are widely patent. A1 segments are well opacified. Anterior communicating artery normal.  There is multi focal irregularity and stenosis within the right A2 segment which is overall diminutive as compared to the left.  Additionally, there is suggestion of either occlusive or near occlusive thrombus within the right A2 segment with blooming artifact seen in this region on gradient echo sequence (series 2 10, image 72). There is faint opacification of the right A2 segment distally. The left A2 segment demonstrates to focal moderate to severe stenoses, 1 at its base near the anterior communicating artery, the other more distally within the left A2 segment (Series 603, image 4).  There is a focal high-grade short-segment stenosis within the mid right M1 segment (series 602, image 7). Left M1 segment widely patent. MCA bifurcations within normal limits. Probable short-segment moderate stenosis within an inferior left M2 branch (series 606, image 12). Scattered atheromatous irregularity seen throughout the MCA branches.  POSTERIOR CIRCULATION:  Left vertebral artery is dominant and widely patent to the vertebrobasilar junction. Multi focal irregularity seen within the diminutive right vertebral artery which appears to terminate in PICA. Mild to moderate multi focal atheromatous irregularity present within the basilar artery which is somewhat narrowed at its mid and distal aspects. Superior cerebellar arteries are opacified proximally. There appears to be fetal origin of the right PCA with a widely patent right posterior communicating artery. Left P1 segment widely patent. Distal left PCA branches are attenuated as compared to the right with multi focal atheromatous irregularity. There may be chronic occlusion of the distal left PCA branches given the absence of acute infarct in this territory on today's study.  No aneurysm or vascular malformation.  IMPRESSION: MRI HEAD IMPRESSION:  1. Acute right ACA territory infarct involving the parasagittal right frontal lobe, body of the right corpus callosum, with additional patchy cortical infarcts involving the right frontal and parietal cortex. There are scattered foci of associated petechial  hemorrhage without frank hemorrhagic conversion. No significant mass effect. 2. Mild age-related generalized cerebral atrophy with chronic small vessel ischemic disease and small remote lacunar infarcts involving the bilateral basal ganglia.  MRA HEAD IMPRESSION:  1. Occlusive versus near occlusive thrombus within the right A2 segment, compatible with right ACA territory infarct. 2. Short-segment high-grade stenosis within the right M1 segment. 3. Additional multi focal atheromatous irregularity with stenoses throughout the intracranial circulation as detailed above.   Electronically Signed   By: Jeannine Boga M.D.   On: 08/19/2014 01:58    DISCHARGE EXAMINATION: Filed Vitals:   08/20/14 2203 08/21/14 0541 08/21/14 0543 08/21/14 1213  BP: 170/99 193/124 181/107 167/106  Pulse: 63 67 116 99  Temp:  97.9 F (36.6 C)    TempSrc:  Oral    Resp:  20    Height:      Weight:      SpO2: 97% 99% 95%    General appearance: alert, cooperative and no distress Resp: clear to auscultation bilaterally Cardio: regular rate and rhythm, S1, S2 normal, no murmur, click, rub or gallop GI: soft, non-tender; bowel sounds normal; no masses,  no organomegaly Extremities: extremities normal, atraumatic, no cyanosis or edema   DISPOSITION: SNF  Discharge Instructions    Call MD for:  difficulty breathing, headache or visual disturbances    Complete by:  As directed      Call MD for:  extreme fatigue    Complete by:  As directed      Call MD for:  persistant dizziness or light-headedness    Complete by:  As directed      Call MD for:  persistant nausea and vomiting    Complete by:  As directed      Call MD for:  severe uncontrolled pain    Complete by:  As directed      Call MD for:  temperature >100.4    Complete by:  As directed      Diet Carb Modified    Complete by:  As directed      Discharge instructions    Complete by:  As directed   You were cared for by a hospitalist during your  hospital stay. If you have any questions about your discharge medications or the care you received while you were in the hospital after you are discharged, you can call the unit and asked to speak with the hospitalist on call if the hospitalist that took care of you is not available. Once you are discharged, your primary care physician will handle any further medical issues. Please note that NO REFILLS for any discharge medications will be authorized once you are discharged, as it is imperative that you return to your primary care physician (or establish a relationship with a primary care physician if you do not have one) for your aftercare needs so that they can reassess your need for medications and monitor your lab values. If you do not have a primary care physician, you can call [phone] for a physician referral.     Increase activity slowly    Complete by:  As directed            ALLERGIES: No Known Allergies   Current Discharge Medication List    START taking these medications   Details  aspirin 325 MG tablet Take 1 tablet (325 mg total) by mouth daily.    insulin aspart (NOVOLOG) 100 UNIT/ML injection Three times a day prior to meals based on following sliding scale. CBG < 70: implement hypoglycemia protocol CBG 70 - 120: 0 units CBG 121 - 150: 2 units CBG 151 - 200: 3 units CBG 201 - 250: 5 units CBG 251 - 300: 8 units and call MD Qty: 10 mL, Refills: 11    insulin glargine (LANTUS) 100 UNIT/ML injection Inject 0.1 mLs (10 Units total) into the skin at bedtime. Qty: 10 mL, Refills: 11    lisinopril (PRINIVIL,ZESTRIL) 20 MG tablet Take 1 tablet (20 mg total) by mouth daily.    magnesium oxide (MAG-OX) 400 (241.3 MG) MG tablet Take 1 tablet (400 mg total) by mouth 2 (two) times daily.    metoprolol (LOPRESSOR) 50 MG tablet Take 1 tablet (50 mg total) by mouth 2 (two) times daily with a meal.      CONTINUE these medications which have NOT CHANGED   Details  allopurinol  (ZYLOPRIM) 100 MG tablet Take 1 tablet (100 mg total) by mouth daily. Qty: 90 tablet, Refills: 3    atorvastatin (LIPITOR) 80 MG tablet Take 1 tablet (80 mg total) by mouth daily. Qty: 90 tablet, Refills: 3      STOP taking these medications     aspirin EC 81 MG tablet      glipiZIDE (GLUCOTROL XL) 10 MG 24 hr tablet      glucose blood (ONE TOUCH TEST  STRIPS) test strip      lisinopril-hydrochlorothiazide (PRINZIDE,ZESTORETIC) 20-12.5 MG per tablet      metFORMIN (GLUCOPHAGE-XR) 500 MG 24 hr tablet        Follow-up Information    Follow up with Cathlean Cower, MD. Schedule an appointment as soon as possible for a visit in 2 weeks.   Specialties:  Internal Medicine, Radiology   Why:  post hospitalization follow up   Contact information:   [place] [address] [phone]       Follow up with SETHI,PRAMOD, MD In 2 months.   Specialties:  Neurology, Radiology   Why:  stroke clinic, office will call you for follow up appointment, call earlier if symptoms worsen or new neurologica   Contact information:   [address] [place] [address] [phone]       TOTAL DISCHARGE TIME: 35 mins  [facility] Pager [phone]  08/21/2014, 1:06 PM

## 2014-08-21 NOTE — Discharge Instructions (Signed)
Ischemic Stroke °A stroke (cerebrovascular accident) is the sudden death of brain tissue. It is a medical emergency. A stroke can cause permanent loss of brain function. This can cause problems with different parts of your body. A transient ischemic attack (TIA) is different because it does not cause permanent damage. A TIA is a short-lived problem of poor blood flow affecting a part of the brain. A TIA is also a serious problem because having a TIA greatly increases the chances of having a stroke. When symptoms first develop, you cannot know if the problem might be a stroke or a TIA. °CAUSES  °A stroke is caused by a decrease of oxygen supply to an area of your brain. It is usually the result of a small blood clot or collection of cholesterol or fat (plaque) that blocks blood flow in the brain. A stroke can also be caused by blocked or damaged carotid arteries.  °RISK FACTORS °· High blood pressure (hypertension). °· High cholesterol. °· Diabetes mellitus. °· Heart disease. °· The buildup of plaque in the blood vessels (peripheral artery disease or atherosclerosis). °· The buildup of plaque in the blood vessels providing blood and oxygen to the brain (carotid artery stenosis). °· An abnormal heart rhythm (atrial fibrillation). °· Obesity. °· Smoking. °· Taking oral contraceptives (especially in combination with smoking). °· Physical inactivity. °· A diet high in fats, salt (sodium), and calories. °· Alcohol use. °· Use of illegal drugs (especially cocaine and methamphetamine). °· Being African American. °· Being over the age of 55. °· Family history of stroke. °· Previous history of blood clots, stroke, TIA, or heart attack. °· Sickle cell disease. °SYMPTOMS  °These symptoms usually develop suddenly, or may be newly present upon awakening from sleep: °· Sudden weakness or numbness of the face, arm, or leg, especially on one side of the body. °· Sudden trouble walking or difficulty moving arms or legs. °· Sudden  confusion. °· Sudden personality changes. °· Trouble speaking (aphasia) or understanding. °· Difficulty swallowing. °· Sudden trouble seeing in one or both eyes. °· Double vision. °· Dizziness. °· Loss of balance or coordination. °· Sudden severe headache with no known cause. °· Trouble reading or writing. °DIAGNOSIS  °Your health care provider can often determine the presence or absence of a stroke based on your symptoms, history, and physical exam. Computed tomography (CT) of the brain is usually performed to confirm the stroke, determine causes, and determine stroke severity. Other tests may be done to find the cause of the stroke. These tests may include: °· Electrocardiography. °· Continuous heart monitoring. °· Echocardiography. °· Carotid ultrasonography. °· Magnetic resonance imaging (MRI). °· A scan of the brain circulation. °· Blood tests. °PREVENTION  °The risk of a stroke can be decreased by appropriately treating high blood pressure, high cholesterol, diabetes, heart disease, and obesity and by quitting smoking, limiting alcohol, and staying physically active. °TREATMENT  °Time is of the essence. It is important to seek treatment at the first sign of these symptoms because you may receive a medicine to dissolve the clot (thrombolytic) that cannot be given if too much time has passed since your symptoms began. Even if you do not know when your symptoms began, get treatment as soon as possible as there are other treatment options available including oxygen, intravenous (IV) fluids, and medicines to thin the blood (anticoagulants). Treatment of stroke depends on the duration, severity, and cause of your symptoms. Medicines and dietary changes may be used to address diabetes, high blood   pressure, and other risk factors. Physical, speech, and occupational therapists will assess you and work with you to improve any functions impaired by the stroke. Measures will be taken to prevent short-term and long-term  complications, including infection from breathing foreign material into the lungs (aspiration pneumonia), blood clots in the legs, bedsores, and falls. Rarely, surgery may be needed to remove large blood clots or to open up blocked arteries. °HOME CARE INSTRUCTIONS  °· Take medicines only as directed by your health care provider. Follow the directions carefully. Medicines may be used to control risk factors for a stroke. Be sure you understand all your medicine instructions. °· You may be told to take a medicine to thin the blood, such as aspirin or the anticoagulant warfarin. Warfarin needs to be taken exactly as instructed. °¨ Too much and too little warfarin are both dangerous. Too much warfarin increases the risk of bleeding. Too little warfarin continues to allow the risk for blood clots. While taking warfarin, you will need to have regular blood tests to measure your blood clotting time. These blood tests usually include both the PT and INR tests. The PT and INR results allow your health care provider to adjust your dose of warfarin. The dose can change for many reasons. It is critically important that you take warfarin exactly as prescribed, and that you have your PT and INR levels drawn exactly as directed. °¨ Many foods, especially foods high in vitamin K, can interfere with warfarin and affect the PT and INR results. Foods high in vitamin K include spinach, kale, broccoli, cabbage, collard and turnip greens, brussels sprouts, peas, cauliflower, seaweed, and parsley, as well as beef and pork liver, green tea, and soybean oil. You should eat a consistent amount of foods high in vitamin K. Avoid major changes in your diet, or notify your health care provider before changing your diet. Arrange a visit with a dietitian to answer your questions. °¨ Many medicines can interfere with warfarin and affect the PT and INR results. You must tell your health care provider about any and all medicines you take. This  includes all vitamins and supplements. Be especially cautious with aspirin and anti-inflammatory medicines. Do not take or discontinue any prescribed or over-the-counter medicine except on the advice of your health care provider or pharmacist. °¨ Warfarin can have side effects, such as excessive bruising or bleeding. You will need to hold pressure over cuts for longer than usual. Your health care provider or pharmacist will discuss other potential side effects. °¨ Avoid sports or activities that may cause injury or bleeding. °¨ Be mindful when shaving, flossing your teeth, or handling sharp objects. °¨ Alcohol can change the body's ability to handle warfarin. It is best to avoid alcoholic drinks or consume only very small amounts while taking warfarin. Notify your health care provider if you change your alcohol intake. °¨ Notify your dentist or other health care providers before procedures. °· If swallow studies have determined that your swallowing reflex is present, you should eat healthy foods. Including 5 or more servings of fruits and vegetables a day may reduce the risk of stroke. Foods may need to be a certain consistency (soft or pureed), or small bites may need to be taken in order to avoid aspirating or choking. Certain dietary changes may be advised to address high blood pressure, high cholesterol, diabetes, or obesity. °¨ Food choices that are low in sodium, saturated fat, trans fat, and cholesterol are recommended to manage high blood pressure. °¨   Food choies that are high in fiber, and low in saturated fat, trans fat, and cholesterol may control cholesterol levels. °¨ Controlling carbohydrates and sugar intake is recommended to manage diabetes. °¨ Reducing calorie intake and making food choices that are low in sodium, saturated fat, trans fat, and cholesterol are recommended to manage obesity. °· Maintain a healthy weight. °· Stay physically active. It is recommended that you get at least 30 minutes of  activity on all or most days. °· Do not use any tobacco products including cigarettes, chewing tobacco, or electronic cigarettes. °· Limit alcohol use even if you are not taking warfarin. Moderate alcohol use is considered to be: °¨ No more than 2 drinks each day for men. °¨ No more than 1 drink each day for nonpregnant women. °· Home safety. A safe home environment is important to reduce the risk of falls. Your health care provider may arrange for specialists to evaluate your home. Having grab bars in the bedroom and bathroom is often important. Your health care provider may arrange for equipment to be used at home, such as raised toilets and a seat for the shower. °· Physical, occupational, and speech therapy. Ongoing therapy may be needed to maximize your recovery after a stroke. If you have been advised to use a walker or a cane, use it at all times. Be sure to keep your therapy appointments. °· Follow all instructions for follow-up with your health care provider. This is very important. This includes any referrals, physical therapy, rehabilitation, and lab tests. Proper follow-up can prevent another stroke from occurring. °SEEK MEDICAL CARE IF: °· You have personality changes. °· You have difficulty swallowing. °· You are seeing double. °· You have dizziness. °· You have a fever. °· You have skin breakdown. °SEEK IMMEDIATE MEDICAL CARE IF:  °Any of these symptoms may represent a serious problem that is an emergency. Do not wait to see if the symptoms will go away. Get medical help right away. Call your local emergency services (911 in [place] Do not drive yourself to the hospital. °· You have sudden weakness or numbness of the face, arm, or leg, especially on one side of the body. °· You have sudden trouble walking or difficulty moving arms or legs. °· You have sudden confusion. °· You have trouble speaking (aphasia) or understanding. °· You have sudden trouble seeing in one or both eyes. °· You have a loss of  balance or coordination. °· You have a sudden, severe headache with no known cause. °· You have new chest pain or an irregular heartbeat. °· You have a partial or total loss of consciousness. °Document Released: 02/21/2005 Document Revised: 07/08/2013 Document Reviewed: 10/02/2011 °Exit[employer]® Patient Information ©2015 [employer]. This information is not intended to replace advice given to you by your health care provider. Make sure you discuss any questions you have with your health care provider. ° °

## 2014-08-21 NOTE — Clinical Social Work Note (Signed)
CSW had extensive conversation with patient's son regarding options for patient's wife at home, and disposition plan for patient. If patient is not accepted into CIR today, the patient will DC to [facility]. Patient's son plans to contact [place] to start Medicaid Application for the wife and request assistance with placement from home. CSW has emailed the son SNF list so that he can begin calling facilities from home. CSW will continue to follow.   Liz Beach MSW, [place], [place], [phone] q

## 2014-08-21 NOTE — Clinical Social Work Placement (Signed)
   CLINICAL SOCIAL WORK PLACEMENT  NOTE  Date:  08/21/2014  Patient Details  Name: Juan [name] MRN: [id number] Date of Birth: 09/14/1950  Clinical Social Work is seeking post-discharge placement for this patient at the [place] level of care (*CSW will initial, date and re-position this form in  chart as items are completed):  Yes   Patient/family provided with [place] Work Department's list of facilities offering this level of care within the geographic area requested by the patient (or if unable, by the patient's family).  Yes   Patient/family informed of their freedom to choose among providers that offer the needed level of care, that participate in Medicare, Medicaid or managed care program needed by the patient, have an available bed and are willing to accept the patient.  Yes   Patient/family informed of  [facility]'s ownership interest in [facility] and [facility], as well as of the fact that they are under no obligation to receive care at these facilities.  PASRR submitted to EDS on 08/21/14     PASRR number received on 08/21/14     Existing PASRR number confirmed on       FL2 transmitted to all facilities in geographic area requested by pt/family on 08/21/14     FL2 transmitted to all facilities within larger geographic area on       Patient informed that his/her managed care company has contracts with or will negotiate with certain facilities, including the following:        Yes   Patient/family informed of bed offers received.  Patient chooses bed at [facility]     Physician recommends and patient chooses bed at      Patient to be transferred to [facility] on 08/21/14.  Patient to be transferred to facility by Ambulance     Patient family notified on 08/21/14 of transfer.  Name of family member notified:  Juan [name]     PHYSICIAN       Additional Comment:  Per MD patient ready  for DC to [facility]. RN, patient, patient's family, and facility notified of DC. RN given number for report. DC packet on chart. Ambulance transport requested for patient for next available. CSW signing off.   _______________________________________________ Liz Beach MSW, [place], [place], [phone]

## 2014-08-22 ENCOUNTER — Non-Acute Institutional Stay (SKILLED_NURSING_FACILITY): Payer: Medicare Other | Admitting: Internal Medicine

## 2014-08-22 ENCOUNTER — Telehealth: Payer: Self-pay | Admitting: *Deleted

## 2014-08-22 ENCOUNTER — Encounter: Payer: Self-pay | Admitting: Internal Medicine

## 2014-08-22 DIAGNOSIS — Z789 Other specified health status: Secondary | ICD-10-CM | POA: Insufficient documentation

## 2014-08-22 DIAGNOSIS — Z7289 Other problems related to lifestyle: Secondary | ICD-10-CM

## 2014-08-22 DIAGNOSIS — F1099 Alcohol use, unspecified with unspecified alcohol-induced disorder: Secondary | ICD-10-CM

## 2014-08-22 DIAGNOSIS — I472 Ventricular tachycardia: Secondary | ICD-10-CM

## 2014-08-22 DIAGNOSIS — IMO0002 Reserved for concepts with insufficient information to code with codable children: Secondary | ICD-10-CM

## 2014-08-22 DIAGNOSIS — I4729 Other ventricular tachycardia: Secondary | ICD-10-CM

## 2014-08-22 DIAGNOSIS — I10 Essential (primary) hypertension: Secondary | ICD-10-CM | POA: Diagnosis not present

## 2014-08-22 DIAGNOSIS — E118 Type 2 diabetes mellitus with unspecified complications: Secondary | ICD-10-CM | POA: Diagnosis not present

## 2014-08-22 DIAGNOSIS — I480 Paroxysmal atrial fibrillation: Secondary | ICD-10-CM | POA: Diagnosis not present

## 2014-08-22 DIAGNOSIS — I6350 Cerebral infarction due to unspecified occlusion or stenosis of unspecified cerebral artery: Secondary | ICD-10-CM | POA: Diagnosis not present

## 2014-08-22 DIAGNOSIS — I639 Cerebral infarction, unspecified: Secondary | ICD-10-CM

## 2014-08-22 DIAGNOSIS — E876 Hypokalemia: Secondary | ICD-10-CM

## 2014-08-22 DIAGNOSIS — E1165 Type 2 diabetes mellitus with hyperglycemia: Secondary | ICD-10-CM

## 2014-08-22 DIAGNOSIS — F1090 Alcohol use, unspecified, uncomplicated: Secondary | ICD-10-CM

## 2014-08-22 LAB — CBC AND DIFFERENTIAL
HCT: 47 % (ref 41–53)
Hemoglobin: 15.4 g/dL (ref 13.5–17.5)
Platelets: 232 10*3/uL (ref 150–399)
WBC: 9.4 10^3/mL

## 2014-08-22 LAB — BASIC METABOLIC PANEL
BUN: 19 mg/dL (ref 4–21)
Creatinine: 1 mg/dL (ref 0.6–1.3)
GLUCOSE: 116 mg/dL
POTASSIUM: 3.6 mmol/L (ref 3.4–5.3)
SODIUM: 136 mmol/L — AB (ref 137–147)

## 2014-08-22 NOTE — Telephone Encounter (Signed)
Pt was on tcm list d/c 08/21/14 sent to SNF...Juan [name]

## 2014-08-22 NOTE — Assessment & Plan Note (Signed)
CHADSVASc= 4. Neurology has discussed the issue of anticoagulation with cardiology. At this time plan is to continue aspirin alone. If the 30 day event monitor or loop recorder reveals atrial fibrillation, at that time consideration will be given to initiating anticoagulation.

## 2014-08-22 NOTE — Assessment & Plan Note (Signed)
Again, patient has been off of his antihypertensives medications for over 2 months. Patient was slowly started back on antihypertensives regimen allowing for permissive hypertension needed due to his stroke. Metoprolol was added due to presence of PVCs as discussed below. Blood pressure still remains elevated. This should be controlled over 1-2 weeks. Please see recommendations above.

## 2014-08-22 NOTE — Assessment & Plan Note (Signed)
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 Patient remained asymptomatic. EKG shows nonspecific T-wave changes similar to previous. No concerning ST changes noted. His recent echocardiogram shows normal systolic function. He was noted to be hypokalemic and had borderline hypomagnesemia as well. This was likely contributing to the episodes of PVCs. After his electrolytes were corrected. The frequency of PVCs reduced. Continue with beta blocker. Patient will also get an event monitor which will also revealed the burden of nonsustained ventricle tachycardia and PVCs.

## 2014-08-22 NOTE — Assessment & Plan Note (Addendum)
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 Will add thiamine 100 mg daily and folate 1 mg daily

## 2014-08-22 NOTE — Assessment & Plan Note (Addendum)
MRI brain shows acute right PCA infarct with a scattered petechial hemorrhages. 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 Patient was seen by neurology. He recommended addressing his risk factors. After multiple discussions regarding aspirin versus anticoagulation. Aspirin has been recommended. Patient underwent stroke workup including carotid Dopplers and echocardiogram and TEE. A 30 day event monitor is to be arranged by cardiology. Loop recorder to be placed this 30 day event monitor is inconclusive. Patient was seen by physical and occupational therapy. Inpatient rehabilitation was recommended. Patient was seen by the rehabilitation consultant recommends skilled nursing facility placement. Asian will remain on aspirin. Also, on statin. Hemoglobin A1c 9.2 in March. Lipid panel reveals LDL of 135. Carotid duplex shows bilateral 1-39 percent ICA stenosis. Vertebral artery flow is antegrade. TEE on 6/15 does not show any clots. 30 day event monitor to be arranged as an outpatient  Pt lethargic today and since he got here yesterday. He answers questions appropriately, just slow. Seems to have muscle weakness. Question if he has extended his infarct, question if he has low K+ as source of muscle weakness. BMP drawn this am.ss

## 2014-08-22 NOTE — Progress Notes (Signed)
MRN: [id number] Name: Juan [name]  Sex: male Age: 64 y.o. DOB: 1950/07/28  [place] #: [name] Facility/Room:100 Level Of Care: SNF Provider: Inocencio Homes D Emergency Contacts: Extended Emergency Contact Information Primary Emergency Contact: Batte,Margie A. Address: [place], [place] Phone: [phone] Work Phone: [phone] Relation: Spouse Secondary Emergency Contact: Real Cons [place] of [place] Phone: [phone] Relation: Son  Code Status:   Allergies: Review of patient's allergies indicates no known allergies.  Chief Complaint  Patient presents with  . New Admit To SNF    HPI: Patient is 64 y.o. male who who is admitted to SNF for OT/PT after being admitted to hospital for acute CVA with L side weakness and uncontrolled DM.  Past Medical History  Diagnosis Date  . Hypertension   . Gout   . Obesity   . Hyperlipidemia   . Diabetes mellitus     type II  . Paroxysmal atrial fibrillation     postoperatively  . DIABETES MELLITUS, TYPE II 07/27/2007  . GOUT 12/01/2006  . HYPERLIPIDEMIA 01/19/2007  . HYPERTENSION 12/01/2006  . 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 OBESITY, HX OF 12/01/2006  . Cataract   . Hx of colonic polyps 05/30/2008    Past Surgical History  Procedure Laterality Date  . Cholecystectomy      Laparoscopic  . Cataract extraction      bilateral  . Colonoscopy    . Tee without cardioversion N/A 08/20/2014    Procedure: TRANSESOPHAGEAL ECHOCARDIOGRAM (TEE);  Surgeon: Sueanne Margarita, MD;  Location: [facility] ENDOSCOPY;  Service: Cardiovascular;  Laterality: N/A;  PT NEED A LOOP      Medication List       This list is accurate as of: 08/22/14  2:26 PM.  Always use your most recent med list.               allopurinol 100 MG tablet  Commonly known as:  ZYLOPRIM  Take 1 tablet (100 mg total) by mouth daily.     aspirin 325 MG tablet  Take 1 tablet (325 mg total)  by mouth daily.     atorvastatin 80 MG tablet  Commonly known as:  LIPITOR  Take 1 tablet (80 mg total) by mouth daily.     insulin aspart 100 UNIT/ML injection  Commonly known as:  novoLOG  Three times a day prior to meals based on following sliding scale. CBG < 70: implement hypoglycemia protocol CBG 70 - 120: 0 units CBG 121 - 150: 2 units CBG 151 - 200: 3 units CBG 201 - 250: 5 units CBG 251 - 300: 8 units and call MD     insulin glargine 100 UNIT/ML injection  Commonly known as:  LANTUS  Inject 0.1 mLs (10 Units total) into the skin at bedtime.     lisinopril 20 MG tablet  Commonly known as:  PRINIVIL,ZESTRIL  Take 1 tablet (20 mg total) by mouth daily.     magnesium oxide 400 (241.3 MG) MG tablet  Commonly known as:  MAG-OX  Take 1 tablet (400 mg total) by mouth 2 (two) times daily.     metoprolol 50 MG tablet  Commonly known as:  LOPRESSOR  Take 1 tablet (50 mg total) by mouth 2 (two) times daily with a meal.        No orders of the defined types were placed in this encounter.    Immunization  History  Administered Date(s) Administered  . Influenza Split 02/25/2011  . Influenza Whole 03/07/1997, 01/19/2007, 01/24/2008, 01/19/2009, 01/19/2010  . Pneumococcal Conjugate-13 12/04/2013  . Pneumococcal Polysaccharide-23 01/24/2008  . Td 03/07/1993, 01/24/2008  . Zoster 01/19/2010    History  Substance Use Topics  . Smoking status: Current Every Day Smoker    Types: Pipe  . Smokeless tobacco: Never Used     Comment: occasional  . Alcohol Use: 0.0 oz/week    0 Standard drinks or equivalent per week     Comment: 2 drinks a day-beer    Family history is noncontributory    Review of Systems  DATA OBTAINED: from nurse, medical record, family GENERAL:  no fevers, fatigue, appetite changes SKIN: No itching, rash or wounds EYES: No eye pain, redness, discharge EARS: No earache, tinnitus, change in hearing NOSE: No congestion, drainage or bleeding  MOUTH/THROAT:  No mouth or tooth pain, No sore throat RESPIRATORY: No cough, wheezing, SOB CARDIAC: No chest pain, palpitations, lower extremity edema  GI: No abdominal pain, No N/V/D or constipation, No heartburn or reflux  GU: No dysuria, frequency or urgency, or incontinence  MUSCULOSKELETAL: No unrelieved bone/joint pain NEUROLOGIC: lethargy noted since arrival at SNF yesterday. PSYCHIATRIC:  No behavior issue.   Filed Vitals:   08/22/14 1330  BP: 184/87  Pulse: 59  Temp: 98.2 F (36.8 C)  Resp: 18    Physical Exam  GENERAL APPEARANCE: lethargic but answers appropriately,  No acute distress.  SKIN: No diaphoresis rash HEAD: Normocephalic, atraumatic  EYES: Conjunctiva/lids clear. Pupils round, reactive. EOMs intact.  EARS: External exam WNL, canals clear. Hearing grossly normal.  NOSE: No deformity or discharge.  MOUTH/THROAT: Lips w/o lesions  RESPIRATORY: Breathing is even, unlabored. Lung sounds are clear   CARDIOVASCULAR: Heart RRR no murmurs, rubs or gallops. No peripheral edema.   GASTROINTESTINAL: Abdomen is soft, non-tender, not distended w/ normal bowel sounds. GENITOURINARY: Bladder non tender, not distended  MUSCULOSKELETAL: No abnormal joints or musculature NEUROLOGIC:  R side facial weakness, L side body weakness  PSYCHIATRIC:  no behavioral issues  Patient Active Problem List   Diagnosis Date Noted  . Hypokalemia 08/22/2014  . Alcohol use 08/22/2014  . NSVT (nonsustained ventricular tachycardia)   . Acute ischemic stroke 08/18/2014  . Increased prostate specific antigen (PSA) velocity 02/25/2011  . Preventative health care 07/18/2010  . Atrial fibrillation 03/09/2010  . 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 Hx of colonic polyps 05/30/2008  . Diabetes mellitus type 2, uncontrolled, with complications 03/54/6568  . Hyperlipidemia 01/19/2007  . GOUT 12/01/2006  . Essential hypertension 12/01/2006  . OBESITY, HX OF 12/01/2006    CBC    Component Value Date/Time    WBC 9.4 08/22/2014 1423   WBC 9.7 08/20/2014 0620   RBC 5.02 08/20/2014 0620   HGB 15.4 08/22/2014 1423   HCT 47 08/22/2014 1423   PLT 232 08/22/2014 1423   MCV 87.6 08/20/2014 0620   LYMPHSABS 1.6 08/18/2014 2026   MONOABS 0.5 08/18/2014 2026   EOSABS 0.0 08/18/2014 2026   BASOSABS 0.0 08/18/2014 2026    CMP     Component Value Date/Time   NA 136* 08/22/2014   NA 135 08/21/2014 0623   K 3.6 08/22/2014 1423   CL 101 08/21/2014 0623   CO2 21* 08/21/2014 0623   GLUCOSE 139* 08/21/2014 0623   BUN 19 08/22/2014 1423   BUN 9 08/21/2014 0623   CREATININE 1.0 08/22/2014 1423   CREATININE 0.90 08/21/2014 0623   CALCIUM 8.8*  08/21/2014 0623   PROT 7.1 08/18/2014 2026   ALBUMIN 3.4* 08/18/2014 2026   AST 19 08/18/2014 2026   ALT 13* 08/18/2014 2026   ALKPHOS 75 08/18/2014 2026   BILITOT 0.9 08/18/2014 2026   GFRNONAA >60 08/21/2014 0623   GFRAA >60 08/21/2014 0623    Assessment and Plan  Acute ischemic stroke MRI brain shows acute right PCA infarct with a scattered petechial hemorrhages. 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 Patient was seen by neurology. He recommended addressing his risk factors. After multiple discussions regarding aspirin versus anticoagulation. Aspirin has been recommended. Patient underwent stroke workup including carotid Dopplers and echocardiogram and TEE. A 30 day event monitor is to be arranged by cardiology. Loop recorder to be placed this 30 day event monitor is inconclusive. Patient was seen by physical and occupational therapy. Inpatient rehabilitation was recommended. Patient was seen by the rehabilitation consultant recommends skilled nursing facility placement. Asian will remain on aspirin. Also, on statin. Hemoglobin A1c 9.2 in March. Lipid panel reveals LDL of 135. Carotid duplex shows bilateral 1-39 percent ICA stenosis. Vertebral artery flow is antegrade. TEE on 6/15 does not show any clots. 30 day event monitor to be arranged as an  outpatient  Pt lethargic today and since he got here yesterday. He answers questions appropriately, just slow. Seems to have muscle weakness. Question if he has extended his infarct, question if he has low K+ as source of muscle weakness. BMP drawn this am.ss  Atrial fibrillation CHADSVASc= 4. Neurology has discussed the issue of anticoagulation with cardiology. At this time plan is to continue aspirin alone. If the 30 day event monitor or loop recorder reveals atrial fibrillation, at that time consideration will be given to initiating anticoagulation.  Diabetes mellitus type 2, uncontrolled, with complications Patient has been off of his medications for over 2 months. Continue Lantus 10 units at bedtime along with sliding scale coverage. Monitor CBGs and adjust accordingly. Discontinued metformin and glipizide for now   Essential hypertension Again, patient has been off of his antihypertensives medications for over 2 months. Patient was slowly started back on antihypertensives regimen allowing for permissive hypertension needed due to his stroke. Metoprolol was added due to presence of PVCs as discussed below. Blood pressure still remains elevated. This should be controlled over 1-2 weeks. Please see recommendations above.   Hypokalemia This was aggressively repleted due to presence of PVCs and nonsustained ventricular tachycardias  NSVT (nonsustained ventricular tachycardia) Frequent PVCs and nonsustained VT's is noted on telemetry. Patient remained asymptomatic. EKG shows nonspecific T-wave changes similar to previous. No concerning ST changes noted. His recent echocardiogram shows normal systolic function. He was noted to be hypokalemic and had borderline hypomagnesemia as well. This was likely contributing to the episodes of PVCs. After his electrolytes were corrected. The frequency of PVCs reduced. Continue with beta blocker. Patient will also get an event monitor which will also revealed  the burden of nonsustained ventricle tachycardia and PVCs.   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 Will add thiamine 100 mg daily and folate 1 mg daily    Hennie Duos, MD

## 2014-08-22 NOTE — Assessment & Plan Note (Signed)
Patient has been off of his medications for over 2 months. Continue Lantus 10 units at bedtime along with sliding scale coverage. Monitor CBGs and adjust accordingly. Discontinued metformin and glipizide for now

## 2014-08-22 NOTE — Assessment & Plan Note (Signed)
This was aggressively repleted due to presence of PVCs and nonsustained ventricular tachycardias

## 2014-08-27 ENCOUNTER — Non-Acute Institutional Stay (SKILLED_NURSING_FACILITY): Payer: Medicare Other | Admitting: Internal Medicine

## 2014-08-27 ENCOUNTER — Encounter: Payer: Self-pay | Admitting: Internal Medicine

## 2014-08-27 DIAGNOSIS — I6350 Cerebral infarction due to unspecified occlusion or stenosis of unspecified cerebral artery: Secondary | ICD-10-CM | POA: Diagnosis not present

## 2014-08-27 DIAGNOSIS — I10 Essential (primary) hypertension: Secondary | ICD-10-CM

## 2014-08-27 DIAGNOSIS — I639 Cerebral infarction, unspecified: Secondary | ICD-10-CM

## 2014-08-27 NOTE — Assessment & Plan Note (Signed)
Pt has improved with PT and ST and is starting to speak some words. Facial weakness improved/almost resolved. No lethargy.

## 2014-08-27 NOTE — Progress Notes (Signed)
MRN: [id number] Name: Juan [name]  Sex: male Age: 64 y.o. DOB: 1950/03/30  [place] #: [name] Facility/Room:100 Level Of Care: SNF Provider: Inocencio Homes D Emergency Contacts: Extended Emergency Contact Information Primary Emergency Contact: Juan A. Address: [place], [place] Phone: [phone] Work Phone: [phone] Relation: Spouse Secondary Emergency Contact: Juan [name] [place] of [place] Phone: [phone] Relation: Son  Code Status:   Allergies: Review of patient's allergies indicates no known allergies.  Chief Complaint  Patient presents with  . Medical Management of Chronic Issues    HPI: Patient is 64 y.o. male who nurse asked me to see for elevated BP today.   Past Medical History  Diagnosis Date  . Hypertension   . Gout   . Obesity   . Hyperlipidemia   . Diabetes mellitus     type II  . Paroxysmal atrial fibrillation     postoperatively  . DIABETES MELLITUS, TYPE II 07/27/2007  . GOUT 12/01/2006  . HYPERLIPIDEMIA 01/19/2007  . HYPERTENSION 12/01/2006  . 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 OBESITY, HX OF 12/01/2006  . Cataract   . Hx of colonic polyps 05/30/2008    Past Surgical History  Procedure Laterality Date  . Cholecystectomy      Laparoscopic  . Cataract extraction      bilateral  . Colonoscopy    . Tee without cardioversion N/A 08/20/2014    Procedure: TRANSESOPHAGEAL ECHOCARDIOGRAM (TEE);  Surgeon: Sueanne Margarita, MD;  Location: [facility] ENDOSCOPY;  Service: Cardiovascular;  Laterality: N/A;  PT NEED A LOOP      Medication List       This list is accurate as of: 08/27/14  1:54 PM.  Always use your most recent med list.               allopurinol 100 MG tablet  Commonly known as:  ZYLOPRIM  Take 1 tablet (100 mg total) by mouth daily.     aspirin 325 MG tablet  Take 1 tablet (325 mg total) by mouth daily.     atorvastatin 80 MG tablet   Commonly known as:  LIPITOR  Take 1 tablet (80 mg total) by mouth daily.     insulin aspart 100 UNIT/ML injection  Commonly known as:  novoLOG  Three times a day prior to meals based on following sliding scale. CBG < 70: implement hypoglycemia protocol CBG 70 - 120: 0 units CBG 121 - 150: 2 units CBG 151 - 200: 3 units CBG 201 - 250: 5 units CBG 251 - 300: 8 units and call MD     insulin glargine 100 UNIT/ML injection  Commonly known as:  LANTUS  Inject 0.1 mLs (10 Units total) into the skin at bedtime.     lisinopril 20 MG tablet  Commonly known as:  PRINIVIL,ZESTRIL  Take 1 tablet (20 mg total) by mouth daily.     magnesium oxide 400 (241.3 MG) MG tablet  Commonly known as:  MAG-OX  Take 1 tablet (400 mg total) by mouth 2 (two) times daily.     metoprolol 50 MG tablet  Commonly known as:  LOPRESSOR  Take 1 tablet (50 mg total) by mouth 2 (two) times daily with a meal.        No orders of the defined types were placed in this encounter.    Immunization History  Administered Date(s) Administered  . Influenza Split 02/25/2011  .  Influenza Whole 03/07/1997, 01/19/2007, 01/24/2008, 01/19/2009, 01/19/2010  . Pneumococcal Conjugate-13 12/04/2013  . Pneumococcal Polysaccharide-23 01/24/2008  . Td 03/07/1993, 01/24/2008  . Zoster 01/19/2010    History  Substance Use Topics  . Smoking status: Current Every Day Smoker    Types: Pipe  . Smokeless tobacco: Never Used     Comment: occasional  . Alcohol Use: 0.0 oz/week    0 Standard drinks or equivalent per week     Comment: 2 drinks a day-beer    Review of Systems  DATA OBTAINED: from patient, nurse; elevated BP GENERAL:  no fevers, fatigue, appetite;pt doing much better;OO, in PT SKIN: No itching, rash HEENT: No complaint RESPIRATORY: No cough, wheezing, SOB CARDIAC: No chest pain, palpitations, lower extremity edema  GI: No abdominal pain, No N/V/D or constipation, No heartburn or reflux  GU: No dysuria, frequency  or urgency, or incontinence  MUSCULOSKELETAL: No unrelieved bone/joint pain NEUROLOGIC: No headache, dizziness  PSYCHIATRIC: No overt anxiety or sadness  Filed Vitals:   08/27/14 1353  BP: 148/114  Pulse: 76  Temp: 97.8 F (36.6 C)  Resp: 18    Physical Exam  GENERAL APPEARANCE: Alert WM, No acute distress  SKIN: No diaphoresis rash HEENT: Unremarkable RESPIRATORY: Breathing is even, unlabored. Lung sounds are clear   CARDIOVASCULAR: Heart RRR no murmurs, rubs or gallops. No peripheral edema  GASTROINTESTINAL: Abdomen is soft, non-tender, not distended w/ normal bowel sounds.  GENITOURINARY: Bladder non tender, not distended  MUSCULOSKELETAL: No abnormal joints or musculature NEUROLOGIC: Cranial nerves 2-12 grossly intact; R facial weakness improved to almost normal;RUE is weak 1/5, LUE  3-4/5; pt can speak some words which is new from admission PSYCHIATRIC: Mood and affect appropriate to situation, no behavioral issues  Patient Active Problem List   Diagnosis Date Noted  . Hypokalemia 08/22/2014  . Alcohol use 08/22/2014  . NSVT (nonsustained ventricular tachycardia)   . Acute ischemic stroke 08/18/2014  . Increased prostate specific antigen (PSA) velocity 02/25/2011  . Preventative health care 07/18/2010  . Atrial fibrillation 03/09/2010  . 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 Hx of colonic polyps 05/30/2008  . Diabetes mellitus type 2, uncontrolled, with complications 20/25/4270  . Hyperlipidemia 01/19/2007  . GOUT 12/01/2006  . Essential hypertension 12/01/2006  . OBESITY, HX OF 12/01/2006    CBC    Component Value Date/Time   WBC 9.4 08/22/2014 1423   WBC 9.7 08/20/2014 0620   RBC 5.02 08/20/2014 0620   HGB 15.4 08/22/2014 1423   HCT 47 08/22/2014 1423   PLT 232 08/22/2014 1423   MCV 87.6 08/20/2014 0620   LYMPHSABS 1.6 08/18/2014 2026   MONOABS 0.5 08/18/2014 2026   EOSABS 0.0 08/18/2014 2026   BASOSABS 0.0 08/18/2014 2026    CMP     Component  Value Date/Time   NA 136* 08/22/2014   NA 135 08/21/2014 0623   K 3.6 08/22/2014 1423   CL 101 08/21/2014 0623   CO2 21* 08/21/2014 0623   GLUCOSE 139* 08/21/2014 0623   BUN 19 08/22/2014 1423   BUN 9 08/21/2014 0623   CREATININE 1.0 08/22/2014 1423   CREATININE 0.90 08/21/2014 0623   CALCIUM 8.8* 08/21/2014 0623   PROT 7.1 08/18/2014 2026   ALBUMIN 3.4* 08/18/2014 2026   AST 19 08/18/2014 2026   ALT 13* 08/18/2014 2026   ALKPHOS 75 08/18/2014 2026   BILITOT 0.9 08/18/2014 2026   GFRNONAA >60 08/21/2014 0623   GFRAA >60 08/21/2014 0623    Assessment and Plan  No  problem-specific assessment & plan notes found for this encounter.   Hennie Duos, MD

## 2014-08-27 NOTE — Assessment & Plan Note (Signed)
Records during hospital  Reviewed. Will add back HCTZ 12.5 mg daily then pt will be back on all pre-hospital meds that he was supposed to be on;will continue to monitor.

## 2014-10-10 DIAGNOSIS — F329 Major depressive disorder, single episode, unspecified: Secondary | ICD-10-CM | POA: Diagnosis not present

## 2014-10-28 DIAGNOSIS — S72001D Fracture of unspecified part of neck of right femur, subsequent encounter for closed fracture with routine healing: Secondary | ICD-10-CM | POA: Diagnosis not present

## 2014-10-28 DIAGNOSIS — R1312 Dysphagia, oropharyngeal phase: Secondary | ICD-10-CM | POA: Diagnosis not present

## 2014-11-13 ENCOUNTER — Non-Acute Institutional Stay (SKILLED_NURSING_FACILITY): Payer: Medicare Other | Admitting: Internal Medicine

## 2014-11-13 DIAGNOSIS — I6350 Cerebral infarction due to unspecified occlusion or stenosis of unspecified cerebral artery: Secondary | ICD-10-CM

## 2014-11-13 DIAGNOSIS — I639 Cerebral infarction, unspecified: Secondary | ICD-10-CM

## 2014-11-13 DIAGNOSIS — M25569 Pain in unspecified knee: Secondary | ICD-10-CM | POA: Insufficient documentation

## 2014-11-13 DIAGNOSIS — E1165 Type 2 diabetes mellitus with hyperglycemia: Secondary | ICD-10-CM

## 2014-11-13 DIAGNOSIS — R252 Cramp and spasm: Secondary | ICD-10-CM

## 2014-11-13 DIAGNOSIS — I10 Essential (primary) hypertension: Secondary | ICD-10-CM | POA: Diagnosis not present

## 2014-11-13 DIAGNOSIS — E118 Type 2 diabetes mellitus with unspecified complications: Secondary | ICD-10-CM | POA: Diagnosis not present

## 2014-11-13 DIAGNOSIS — IMO0002 Reserved for concepts with insufficient information to code with codable children: Secondary | ICD-10-CM

## 2014-11-13 NOTE — Progress Notes (Signed)
Patient ID: Juan [name], male   DOB: 08-Jan-1951, 64 y.o.   MRN: [id number] MRN: [id number] Name: Juan [name]  Sex: male Age: 64 y.o. DOB: 08-26-1950  [place] #: [name] Facility/Room:100 Level Of Care: SNF Provider: Wille Celeste Emergency Contacts: Extended Emergency Contact Information Primary Emergency Contact: Albee,Margie A. Address: [place], [place] Phone: [phone] Work Phone: [phone] Relation: Spouse Secondary Emergency Contact: Real Cons [place] of [place] Phone: [phone] Relation: Son  Code Status:   Allergies: Review of patient's allergies indicates no known allergies.  Chief Complaint  Patient presents with  . Acute Visit  . Medical Management of Chronic Issues    HPI: Patient is 64 y.o. male who who is admitted to SNF for OT/PT after being admitted to hospital for acute CVA with L side weakness and uncontrolled DM.--This appeared to have stabilized he has gained strength in his speech has improved-blood sugars appear to be relatively well controlled CBG in the a.m.'s in the higher 100s-will check a hemoglobin A1c.  Most acute issue recently was elevated blood pressure Dr. Vinnie Level did start him on low dose hydrochlorothiazide which apparently he had been on previously-I note is pressures today is elevated at 164/87 it appears he has frequent systolics in the [id number] area-he is not complaining of any headache dizziness syncopal-type feelings or chest pain  Patient does complain of some leg spasms he says when he gets out of bed he says this seems to resolve once he gets his feet Placed firmly on the floor--he denies really any pain with this  Past Medical History  Diagnosis Date  . Hypertension   . Gout   . Obesity   . Hyperlipidemia   . Diabetes mellitus     type II  . Paroxysmal atrial fibrillation     postoperatively  . DIABETES MELLITUS, TYPE II  07/27/2007  . GOUT 12/01/2006  . HYPERLIPIDEMIA 01/19/2007  . HYPERTENSION 12/01/2006  . 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 OBESITY, HX OF 12/01/2006  . Cataract   . Hx of colonic polyps 05/30/2008    Past Surgical History  Procedure Laterality Date  . Cholecystectomy      Laparoscopic  . Cataract extraction      bilateral  . Colonoscopy    . Tee without cardioversion N/A 08/20/2014    Procedure: TRANSESOPHAGEAL ECHOCARDIOGRAM (TEE);  Surgeon: Sueanne Margarita, MD;  Location: [facility] ENDOSCOPY;  Service: Cardiovascular;  Laterality: N/A;  PT NEED A LOOP      Medication List       This list is accurate as of: 11/13/14 11:59 PM.  Always use your most recent med list.               allopurinol 100 MG tablet  Commonly known as:  ZYLOPRIM  Take 1 tablet (100 mg total) by mouth daily.     aspirin 325 MG tablet  Take 1 tablet (325 mg total) by mouth daily.     atorvastatin 80 MG tablet  Commonly known as:  LIPITOR  Take 1 tablet (80 mg total) by mouth daily.     insulin aspart 100 UNIT/ML injection  Commonly known as:  novoLOG  Three times a day prior to meals based on following sliding scale. CBG < 70: implement hypoglycemia protocol CBG 70 - 120: 0 units CBG 121 - 150: 2 units CBG 151 - 200: 3 units CBG 201 -  250: 5 units CBG 251 - 300: 8 units and call MD     insulin glargine 100 UNIT/ML injection  Commonly known as:  LANTUS  Inject 0.1 mLs (10 Units total) into the skin at bedtime.     lisinopril 20 MG tablet  Commonly known as:  PRINIVIL,ZESTRIL  Take 1 tablet (20 mg total) by mouth daily.     magnesium oxide 400 (241.3 MG) MG tablet  Commonly known as:  MAG-OX  Take 1 tablet (400 mg total) by mouth 2 (two) times daily.     metoprolol 50 MG tablet  Commonly known as:  LOPRESSOR  Take 1 tablet (50 mg total) by mouth 2 (two) times daily with a meal.       of note his hydrochlorothiazide 12.5 mg a day has been restarted    Immunization History  Administered Date(s)  Administered  . Influenza Split 02/25/2011  . Influenza Whole 03/07/1997, 01/19/2007, 01/24/2008, 01/19/2009, 01/19/2010  . Pneumococcal Conjugate-13 12/04/2013  . Pneumococcal Polysaccharide-23 01/24/2008  . Td 03/07/1993, 01/24/2008  . Zoster 01/19/2010    Social History  Substance Use Topics  . Smoking status: Current Every Day Smoker    Types: Pipe  . Smokeless tobacco: Never Used     Comment: occasional  . Alcohol Use: 0.0 oz/week    0 Standard drinks or equivalent per week     Comment: 2 drinks a day-beer    Family history is noncontributory    Review of Systems  DATA OBTAINED: from nurse, medical record, family [name]:  no fevers, fatigue, appetite changes SKIN: No itching, rash or wounds EYES: No eye pain, redness, discharge EARS: No earache, tinnitus, change in hearing NOSE: No congestion, drainage or bleeding  MOUTH/THROAT: No mouth or tooth pain, No sore throat RESPIRATORY: No cough, wheezing, SOB CARDIAC: No chest pain, palpitations, lower extremity edema  GI: No abdominal pain, No N/V/D or constipation, No heartburn or reflux  GU: No dysuria, frequency or urgency, or incontinence  MUSCULOSKELETAL: No unrelieved bone/joint pain does complain of what he describes as leg spasms when he initially gets out of bed NEUROLOGIC: He has gained strength had initially some left-sided weakness still has some mild weakness but is doing significantly better here PSYCHIATRIC:  No behavior issue.     Physical Exam Temperature is 98.0 pulse 55 respirations 20 blood pressure 164/87 [name] APPEARANCE: Pleasant male in no distress lying comfortably in bed  SKIN: No diaphoresis rash HEAD: Normocephalic, atraumatic  EYES: Conjunctiva/lids clear. Pupils round, reactive. EOMs intact.  EARS: External exam WNL, canals clear. Hearing grossly normal.  NOSE: No deformity or discharge.  MOUTH/THROAT:Oro  harynx is clear mucous membranes moist RESPIRATORY: Breathing is even,  unlabored. Lung sounds are clear   CARDIOVASCULAR: Heart RRR lightly bradycardic  no murmurs, rubs or gallops. No peripheral edema.   GASTROINTESTINAL: Abdomen is soft, non-tender, not distended w/ normal bowel sounds.   MUSCULOSKELETAL: No overt abnormal joints or musculoskeletal changes does have a history of mild left-sided weakness he is able to move all extremities 4 lift his arm grip strength appears to be intact bilaterally also able to lift legs bilaterally e NEUROLOGIC:  R side facial weakness, L side body weakness which appears to be improving   PSYCHIATRIC:  no behavioral issues  Patient Active Problem List   Diagnosis Date Noted  . Pain in joint, lower leg 11/13/2014  . Hypokalemia 08/22/2014  . Alcohol use 08/22/2014  . NSVT (nonsustained ventricular tachycardia)   . Acute ischemic stroke  08/18/2014  . Increased prostate specific antigen (PSA) velocity 02/25/2011  . Preventative health care 07/18/2010  . Atrial fibrillation 03/09/2010  . 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 Hx of colonic polyps 05/30/2008  . Diabetes mellitus type 2, uncontrolled, with complications 97/67/3419  . Hyperlipidemia 01/19/2007  . GOUT 12/01/2006  . Essential hypertension 12/01/2006  . OBESITY, HX OF 12/01/2006    Labs.  08/25/2014.  Sodium 139 potassium 3.6 BUN 24 creatinine 1.0.  08/22/2014.  WBC 9.4 hemoglobin 15.4 platelets 232.    CBC    Component Value Date/Time   WBC 9.4 08/22/2014 1423   WBC 9.7 08/20/2014 0620   RBC 5.02 08/20/2014 0620   HGB 15.4 08/22/2014 1423   HCT 47 08/22/2014 1423   PLT 232 08/22/2014 1423   MCV 87.6 08/20/2014 0620   LYMPHSABS 1.6 08/18/2014 2026   MONOABS 0.5 08/18/2014 2026   EOSABS 0.0 08/18/2014 2026   BASOSABS 0.0 08/18/2014 2026    CMP     Component Value Date/Time   NA 136* 08/22/2014   NA 135 08/21/2014 0623   K 3.6 08/22/2014 1423   CL 101 08/21/2014 0623   CO2 21* 08/21/2014 0623   GLUCOSE 139* 08/21/2014 0623   BUN  19 08/22/2014 1423   BUN 9 08/21/2014 0623   CREATININE 1.0 08/22/2014 1423   CREATININE 0.90 08/21/2014 0623   CALCIUM 8.8* 08/21/2014 0623   PROT 7.1 08/18/2014 2026   ALBUMIN 3.4* 08/18/2014 2026   AST 19 08/18/2014 2026   ALT 13* 08/18/2014 2026   ALKPHOS 75 08/18/2014 2026   BILITOT 0.9 08/18/2014 2026   GFRNONAA >60 08/21/2014 0623   GFRAA >60 08/21/2014 0623    Assessment and Plan   1 history of acute ischemic stroke-continues on aspirin for anticoagulation-continues also on a statin.  Carotid Doppler showed bilateral 1-39 percent ICA stenosis-he appears to be improving which is encouraging his speech has improved strength has improved on the left side as well.  History of atrial fibrillation continues on aspirin-this appears to be rate controlled he is on Lopressor as well for rate control.  #3 diabetes type 2-he continues on Lantus insulin 10 units daily at bedtime as well as NovoLog sliding scale blood sugars in the morning appear to run largely in the higher 100s-later in the day there is variability from higher 100s to low 200s generally at this point will order a hemoglobin A1c to assess long-term control   #4-history of hypertension this appears to be somewhat of a challenge he is back on hydrochlorothiazide 12.5 mg a day this was added by Dr. Sheppard Coil previously-is on lisinopril 20 mg a day as well will increase his lisinopril up to 30 mg a day and monitor-updated BMP in 2 weeks to make sure electrolytes potassium are satisfactory.  #5-hypokalemia-apparently this was aggressively repleted in the hospital because of presence of PVCs and nonsustained ventricular tachycardia in the hospital that has not been an issue here-an echo in the hospital showed normal systolic function.  Was thoughtt hypokalemia borderline lowmagnesium was contributing to this.--He is on magnesium supplementation  This was corrected.  #6 extremely of alcohol abuse this has not been an issue  during his stay here he is on folic acid and also was put on thiamine  #7-history of gout this has not been an issue during his stay here he is on allopurinol.  #8 leg spasms apparently this is quite transitory and appears to be position related as he just gets out of bed  once he is able to place his foot firmly on the floor this stops-will check his electrolytes and a CBC here to make sure this is stable also monitor this if it worsens certainly consider more aggressive measures.--Physical exam was non-diagnostic  [id number]-of note greater than 35 minutes spent assessing patient-discussing his status with nursing staff-reviewing his chart-and coordinating and formulating a plan of care for numerous diagnoses-of note greater than 50% of time spent coordinating plan of care  .Marland Kitchen    LASSEN, ARLO C,

## 2014-11-14 DIAGNOSIS — E08311 Diabetes mellitus due to underlying condition with unspecified diabetic retinopathy with macular edema: Secondary | ICD-10-CM | POA: Diagnosis not present

## 2014-11-14 DIAGNOSIS — I10 Essential (primary) hypertension: Secondary | ICD-10-CM | POA: Diagnosis not present

## 2014-11-20 ENCOUNTER — Non-Acute Institutional Stay (SKILLED_NURSING_FACILITY): Payer: Medicare Other | Admitting: Internal Medicine

## 2014-11-20 ENCOUNTER — Encounter: Payer: Self-pay | Admitting: Internal Medicine

## 2014-11-20 DIAGNOSIS — I10 Essential (primary) hypertension: Secondary | ICD-10-CM

## 2014-11-20 NOTE — Progress Notes (Signed)
Patient ID: Juan [name], male   DOB: [birth year], 63 y.o.   MRN: [id number]  MRN: [id number] Name: Juan [name]  Sex: male Age: 64 y.o. DOB: [birth year]  [place] #: [name] Facility/Room:100 Level Of Care: SNF Provider: Wille Celeste Emergency Contacts: Extended Emergency Contact Information Primary Emergency Contact: Sura,Margie A. Address: [place], [place] Phone: [phone] Work Phone: [phone] Relation: Spouse Secondary Emergency Contact: Real Cons [place] of [place] Phone: [phone] Relation: Son  Code Status:   Allergies: Review of patient's allergies indicates no known allergies.  Chief Complaint  Patient presents with  . Acute Visit   Follow-up hypertension  HPI: Patient is 64 y.o. male who who is admitted to SNF for OT/PT after being admitted to hospital for acute CVA with L side weakness and uncontrolled DM.--This appeared to have stabilized he has gained strength in his speech has improved-blood sugars appear to be relatively well controlled .  Most acute issue recently was elevated blood pressure Dr. Sheppard Coil did start him on low dose hydrochlorothiazide which apparently he had been on previously--when I last saw him he was on lisinopril 20 mg a day as well. I increased  this to 30 mg a day secondary to still some elevated blood pressures-appears blood pressures still are somewhat elevated however most recently in the 267T-245Y systolically-he does not complain of any increased headache or dizziness again appears to be clinically doing well .      Past Medical History  Diagnosis Date  . Hypertension   . Gout   . Obesity   . Hyperlipidemia   . Diabetes mellitus     type II  . Paroxysmal atrial fibrillation     postoperatively  . DIABETES MELLITUS, TYPE II 07/27/2007  . GOUT 12/01/2006  . HYPERLIPIDEMIA 01/19/2007  . HYPERTENSION 12/01/2006  . POSTURAL  LIGHTHEADEDNESS 02/11/2010  . OBESITY, HX OF 12/01/2006  . Cataract   . Hx of colonic polyps 05/30/2008    Past Surgical History  Procedure Laterality Date  . Cholecystectomy      Laparoscopic  . Cataract extraction      bilateral  . Colonoscopy    . Tee without cardioversion N/A 08/20/2014    Procedure: TRANSESOPHAGEAL ECHOCARDIOGRAM (TEE);  Surgeon: Sueanne Margarita, MD;  Location: [facility] ENDOSCOPY;  Service: Cardiovascular;  Laterality: N/A;  PT NEED A LOOP      Medication List       This list is accurate as of: 11/20/14 11:59 PM.  Always use your most recent med list.               allopurinol 100 MG tablet  Commonly known as:  ZYLOPRIM  Take 1 tablet (100 mg total) by mouth daily.     aspirin 325 MG tablet  Take 1 tablet (325 mg total) by mouth daily.     atorvastatin 80 MG tablet  Commonly known as:  LIPITOR  Take 1 tablet (80 mg total) by mouth daily.     insulin aspart 100 UNIT/ML injection  Commonly known as:  novoLOG  Three times a day prior to meals based on following sliding scale. CBG < 70: implement hypoglycemia protocol CBG 70 - 120: 0 units CBG 121 - 150: 2 units CBG 151 - 200: 3 units CBG 201 - 250: 5 units CBG 251 - 300: 8 units and call MD     insulin glargine 100  UNIT/ML injection  Commonly known as:  LANTUS  Inject 0.1 mLs (10 Units total) into the skin at bedtime.     lisinopril 20 MG tablet  Commonly known as:  PRINIVIL,ZESTRIL  Take 1 tablet (20 mg total) by mouth daily.     magnesium oxide 400 (241.3 MG) MG tablet  Commonly known as:  MAG-OX  Take 1 tablet (400 mg total) by mouth 2 (two) times daily.     metoprolol 50 MG tablet  Commonly known as:  LOPRESSOR  Take 1 tablet (50 mg total) by mouth 2 (two) times daily with a meal.       of note his hydrochlorothiazide 12.5 mg a day has been restarted His lisinopril was recently increased up to 30 mg a day    Immunization History  Administered Date(s) Administered  . Influenza Split  02/25/2011  . Influenza Whole 03/07/1997, 01/19/2007, 01/24/2008, 01/19/2009, 01/19/2010  . Pneumococcal Conjugate-13 12/04/2013  . Pneumococcal Polysaccharide-23 01/24/2008  . Td 03/07/1993, 01/24/2008  . Zoster 01/19/2010    Social History  Substance Use Topics  . Smoking status: Current Every Day Smoker    Types: Pipe  . Smokeless tobacco: Never Used     Comment: occasional  . Alcohol Use: 0.0 oz/week    0 Standard drinks or equivalent per week     Comment: 2 drinks a day-beer    Family history is noncontributory    Review of Systems  DATA OBTAINED: from nurse, medical record, family [name]:  no fevers, fatigue, appetite changes SKIN: No itching, rash or wounds EYES: No eye pain, redness, discharge EARS: No earache, tinnitus, change in hearing NOSE: No congestion, drainage or bleeding  MOUTH/THROAT: No mouth or tooth pain, No sore throat RESPIRATORY: No cough, wheezing, SOB CARDIAC: No chest pain, palpitations, lower extremity edema  GI: No abdominal pain, No N/V/D or constipation, No heartburn or reflux  GU: No dysuria, frequency or urgency, or incontinence  MUSCULOSKELETAL: No unrelieved bone/joint pain is not complaining of leg spasms today he has previously at times  NEUROLOGIC: He has gained strength had initially some left-sided weakness still has some mild weakness but is doing significantly better here PSYCHIATRIC:  No behavior issue.     Physical Exam  He is afebrile pulse of 56 respirations 18 blood pressure 169/83-154/95- [name] APPEARANCE: Pleasant male in no distress and comfortably in his wheelchair  SKIN: No diaphoresis rash HEAD: Normocephalic, atraumatic  EYES: Conjunctiva/lids clear. Pupils round, reactive. EOMs intact.  EARS: External exam WNL, canals clear. Hearing grossly normal.  NOSE: No deformity or discharge.  MOUTH/THROAT:Orop  harynx is clear mucous membranes moist RESPIRATORY: Breathing is even, unlabored. Lung sounds are clear    CARDIOVASCULAR: Heart RRR lightly bradycardic  no murmurs, rubs or gallops. No peripheral edema.   GASTROINTESTINAL: Abdomen is soft, non-tender, not distended w/ normal bowel sounds.   MUSCULOSKELETAL: No overt abnormal joints or musculoskeletal changes does have a history of mild left-sided weakness he is able to move all extremities 4 lift his arm grip strength appears to be intact bilaterally also able to lift legs bilaterally e NEUROLOGIC:  R side facial weakness, L side body weakness which appears to bestable--improved from initial presentation in facility   PSYCHIATRIC:  no behavioral issues  Patient Active Problem List   Diagnosis Date Noted  . Pain in joint, lower leg 11/13/2014  . Hypokalemia 08/22/2014  . Alcohol use 08/22/2014  . NSVT (nonsustained ventricular tachycardia)   . Acute ischemic stroke 08/18/2014  . Increased  prostate specific antigen (PSA) velocity 02/25/2011  . Preventative health care 07/18/2010  . Atrial fibrillation 03/09/2010  . 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 Hx of colonic polyps 05/30/2008  . Diabetes mellitus type 2, uncontrolled, with complications 41/66/0630  . Hyperlipidemia 01/19/2007  . GOUT 12/01/2006  . Essential hypertension 12/01/2006  . OBESITY, HX OF 12/01/2006    Labs  11/14/2014.  WBC 9.3 hemoglobin 13.7 platelets 232.  Sodium 142 potassium 3.9 BUN 16 creatinine 1.0.  08/25/2014.  Sodium 139 potassium 3.6 BUN 24 creatinine 1.0.  08/22/2014.  WBC 9.4 hemoglobin 15.4 platelets 232.    CBC    Component Value Date/Time   WBC 9.4 08/22/2014 1423   WBC 9.7 08/20/2014 0620   RBC 5.02 08/20/2014 0620   HGB 15.4 08/22/2014 1423   HCT 47 08/22/2014 1423   PLT 232 08/22/2014 1423   MCV 87.6 08/20/2014 0620   LYMPHSABS 1.6 08/18/2014 2026   MONOABS 0.5 08/18/2014 2026   EOSABS 0.0 08/18/2014 2026   BASOSABS 0.0 08/18/2014 2026    CMP     Component Value Date/Time   NA 136* 08/22/2014   NA 135 08/21/2014 0623    K 3.6 08/22/2014 1423   CL 101 08/21/2014 0623   CO2 21* 08/21/2014 0623   GLUCOSE 139* 08/21/2014 0623   BUN 19 08/22/2014 1423   BUN 9 08/21/2014 0623   CREATININE 1.0 08/22/2014 1423   CREATININE 0.90 08/21/2014 0623   CALCIUM 8.8* 08/21/2014 0623   PROT 7.1 08/18/2014 2026   ALBUMIN 3.4* 08/18/2014 2026   AST 19 08/18/2014 2026   ALT 13* 08/18/2014 2026   ALKPHOS 75 08/18/2014 2026   BILITOT 0.9 08/18/2014 2026   GFRNONAA >60 08/21/2014 0623   GFRAA >60 08/21/2014 0623    Assessment and Plan  Hypertension-continues to be challenging-will increase his lisinopril up to 40 mg a day and monitor blood pressures and pulse twice a day for review by provider-clinically appears stable if this is not effective will have to add another agent I suspect  [id number]     .Marland Kitchen    Robet Crutchfield C,

## 2014-11-21 DIAGNOSIS — I69354 Hemiplegia and hemiparesis following cerebral infarction affecting left non-dominant side: Secondary | ICD-10-CM | POA: Diagnosis not present

## 2014-11-21 DIAGNOSIS — M6281 Muscle weakness (generalized): Secondary | ICD-10-CM | POA: Diagnosis not present

## 2014-11-27 DIAGNOSIS — I10 Essential (primary) hypertension: Secondary | ICD-10-CM | POA: Diagnosis not present

## 2014-12-01 DIAGNOSIS — I10 Essential (primary) hypertension: Secondary | ICD-10-CM | POA: Diagnosis not present

## 2014-12-06 DIAGNOSIS — I10 Essential (primary) hypertension: Secondary | ICD-10-CM | POA: Diagnosis not present

## 2014-12-08 DIAGNOSIS — Z23 Encounter for immunization: Secondary | ICD-10-CM | POA: Diagnosis not present

## 2014-12-16 DIAGNOSIS — M79672 Pain in left foot: Secondary | ICD-10-CM | POA: Diagnosis not present

## 2014-12-16 DIAGNOSIS — E119 Type 2 diabetes mellitus without complications: Secondary | ICD-10-CM | POA: Diagnosis not present

## 2014-12-16 DIAGNOSIS — B351 Tinea unguium: Secondary | ICD-10-CM | POA: Diagnosis not present

## 2014-12-16 DIAGNOSIS — M79671 Pain in right foot: Secondary | ICD-10-CM | POA: Diagnosis not present

## 2014-12-18 ENCOUNTER — Ambulatory Visit: Payer: Medicare Other | Admitting: Internal Medicine

## 2014-12-18 DIAGNOSIS — Z0289 Encounter for other administrative examinations: Secondary | ICD-10-CM

## 2014-12-30 DIAGNOSIS — F329 Major depressive disorder, single episode, unspecified: Secondary | ICD-10-CM | POA: Diagnosis not present

## 2014-12-31 ENCOUNTER — Encounter: Payer: Self-pay | Admitting: Internal Medicine

## 2014-12-31 ENCOUNTER — Non-Acute Institutional Stay (SKILLED_NURSING_FACILITY): Payer: Medicare Other | Admitting: Internal Medicine

## 2014-12-31 DIAGNOSIS — I482 Chronic atrial fibrillation, unspecified: Secondary | ICD-10-CM

## 2014-12-31 DIAGNOSIS — I639 Cerebral infarction, unspecified: Secondary | ICD-10-CM

## 2014-12-31 DIAGNOSIS — E876 Hypokalemia: Secondary | ICD-10-CM | POA: Diagnosis not present

## 2014-12-31 DIAGNOSIS — I10 Essential (primary) hypertension: Secondary | ICD-10-CM | POA: Diagnosis not present

## 2014-12-31 NOTE — Progress Notes (Signed)
Patient ID: [name], male   DOB: [birth year], 64 y.o.   MRN: [id number]  MRN: [id number] Name: RUMEAL CULLIPHER  Sex: male Age: 64 y.o. DOB: [birth year]  [place] #: [name] Facility/Room:100 Level Of Care: SNF Provider: Wille Celeste Emergency Contacts: Extended Emergency Contact Information Primary Emergency Contact: Capshaw,Margie A. Address: [place], [place] Phone: [phone] Work Phone: [phone] Relation: Spouse Secondary Emergency Contact: Real Cons States of [place] Phone: [phone] Relation: Son  Code Status:   Allergies: Review of patient's allergies indicates no known allergies.  Chief Complaint  Patient presents with  . Medical Management of Chronic Issues    HPI: Patient is 64 y.o. male who who is admitted to SNF for OT/PT after being admitted to hospital for acute CVA with L side weakness and uncontrolled DM.--This appeared to have stabilized he has gained strength in his speech has improved-   Most acute issue recently was elevated blood pressure Dr. Sheppard Coil did start him on low dose hydrochlorothiazide which apparently he had been on previously Blood pressures continued to be somewhat elevated and his lisinopril has been increased to 40 mg a day-it appears his blood pressures are somewhat variable although still somewhat elevated today 150/84-previous 135/80-163/98-153/90.  He is often bradycardic with pulse in the 50s he does have a history of atrial fibrillation he is on Lopressor 50 mg twice a day-he does not appear to be symptomatic- Per review of recent pulse readings vary from the 50s-up to the 80's  Allb back in September showed a potassium of 3.2 he is now on potassium supplementation-his potassium has normalized per most recent lab on October 1 it was 3.9  When I originally saw him in September he did complain of some leg spasms initially in the morning when he  got out of bed-he is not complaining of this anymore however  He does have a history diabetes type 2 he is on Lantus 10 units daily-he is also on sliding scale insulin-blood sugars appear to be in the higher 100s to low 200s generally his hemoglobin A1c back in September was 7.2  He does not have any acute complaints today       Past Medical History  Diagnosis Date  . Hypertension   . Gout   . Obesity   . Hyperlipidemia   . Diabetes mellitus     type II  . Paroxysmal atrial fibrillation (HCC)     postoperatively  . DIABETES MELLITUS, TYPE II 07/27/2007  . GOUT 12/01/2006  . HYPERLIPIDEMIA 01/19/2007  . HYPERTENSION 12/01/2006  . 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 OBESITY, HX OF 12/01/2006  . Cataract   . Hx of colonic polyps 05/30/2008    Past Surgical History  Procedure Laterality Date  . Cholecystectomy      Laparoscopic  . Cataract extraction      bilateral  . Colonoscopy    . Tee without cardioversion N/A 08/20/2014    Procedure: TRANSESOPHAGEAL ECHOCARDIOGRAM (TEE);  Surgeon: Sueanne Margarita, MD;  Location: [facility] ENDOSCOPY;  Service: Cardiovascular;  Laterality: N/A;  PT NEED A LOOP      Medication List       This list is accurate as of: 12/31/14  7:50 PM.  Always use your most recent med list.               allopurinol 100 MG tablet  Commonly known as:  ZYLOPRIM  Take 1 tablet (100 mg total) by mouth daily.     aspirin 325 MG tablet  Take 1 tablet (325 mg total) by mouth daily.     atorvastatin 80 MG tablet  Commonly known as:  LIPITOR  Take 1 tablet (80 mg total) by mouth daily.     hydrochlorothiazide 12.5 MG capsule  Commonly known as:  MICROZIDE  Take 12.5 mg by mouth daily.     insulin aspart 100 UNIT/ML injection  Commonly known as:  novoLOG  Three times a day prior to meals based on following sliding scale. CBG < 70: implement hypoglycemia protocol CBG 70 - 120: 0 units CBG 121 - 150: 2 units CBG 151 - 200: 3 units CBG 201 - 250: 5 units CBG  251 - 300: 8 units and call MD     insulin glargine 100 UNIT/ML injection  Commonly known as:  LANTUS  Inject 0.1 mLs (10 Units total) into the skin at bedtime.     lisinopril 20 MG tablet  Commonly known as:  PRINIVIL,ZESTRIL  Take 1 tablet (20 mg total) by mouth daily.     magnesium oxide 400 (241.3 MG) MG tablet  Commonly known as:  MAG-OX  Take 1 tablet (400 mg total) by mouth 2 (two) times daily.     metoprolol 50 MG tablet  Commonly known as:  LOPRESSOR  Take 1 tablet (50 mg total) by mouth 2 (two) times daily with a meal.     potassium chloride SA 20 MEQ tablet  Commonly known as:  K-DUR,KLOR-CON  Take 20 mEq by mouth 2 (two) times daily.       of note his hydrochlorothiazide 12.5 mg a day has been restarted    Immunization History  Administered Date(s) Administered  . Influenza Split 02/25/2011  . Influenza Whole 03/07/1997, 01/19/2007, 01/24/2008, 01/19/2009, 01/19/2010  . Pneumococcal Conjugate-13 12/04/2013  . Pneumococcal Polysaccharide-23 01/24/2008  . Td 03/07/1993, 01/24/2008  . Zoster 01/19/2010    Social History  Substance Use Topics  . Smoking status: Current Every Day Smoker    Types: Pipe  . Smokeless tobacco: Never Used     Comment: occasional  . Alcohol Use: 0.0 oz/week    0 Standard drinks or equivalent per week     Comment: 2 drinks a day-beer    Family history is noncontributory    Review of Systems  DATA OBTAINED: from nurse, medical record, patient GENERAL:  no fevers, fatigue, appetite changes SKIN: No itching, rash or wounds EYES: No eye pain, redness, discharge EARS: No earache, tinnitus, change in hearing NOSE: No congestion, drainage or bleeding  MOUTH/THROAT: No mouth or tooth pain, No sore throat RESPIRATORY: No cough, wheezing, SOB CARDIAC: No chest pain, palpitations, lower extremity edema  GI: No abdominal pain, No N/V/D or constipation, No heartburn or reflux  GU: No dysuria, frequency or urgency, or incontinence   MUSCULOSKELETAL: No unrelieved bone/joint pain he is no longer complaining of leg spasms NEUROLOGIC: He has gained strength had initially some left-sided weakness still has some mild weakness but is doing significantly better here has worked with physical therapy PSYCHIATRIC:  No behavior issue.     Physical Exam Temperature 99.4 pulse 67 respirations 20 blood pressure taken manually 158/96 GENERAL APPEARANCE: Pleasant male in no distress lying comfortably in bed  SKIN: No diaphoresis rash HEAD: Normocephalic, atraumatic  EYES: Conjunctiva/lids clear. Pupils round, reactive. EOMs intact.  EARS: External exam WNL, canals clear. Hearing grossly normal.  NOSE: No deformity  or discharge.  MOUTH/THROAT: Oropharynx is clear mucous membranes moist RESPIRATORY: Breathing is even, unlabored. Lung sounds are clear   CARDIOVASCULAR: Heart RRR slightly  bradycardic  no murmurs, rubs or gallops. No peripheral edema.   GASTROINTESTINAL: Abdomen is soft, non-tender, not distended w/ normal bowel sounds.   MUSCULOSKELETAL: No overt abnormal joints or musculoskeletal changes does have a history of mild left-sided weakness he is able to move all extremities 4 lift his arm grip strength appears to be intact bilaterally also able to lift legs bilaterally  NEUROLOGIC:  R side facial weakness, L side body weakness which appears to be gradually improving  PSYCHIATRIC:  no behavioral issues  Patient Active Problem List   Diagnosis Date Noted  . Pain in joint, lower leg 11/13/2014  . Hypokalemia 08/22/2014  . Alcohol use ([place]) 08/22/2014  . NSVT (nonsustained ventricular tachycardia) ([place])   . Acute ischemic stroke ([place]) 08/18/2014  . Increased prostate specific antigen (PSA) velocity 02/25/2011  . Preventative health care 07/18/2010  . Atrial fibrillation ([place]) 03/09/2010  . 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 Hx of colonic polyps 05/30/2008  . Diabetes mellitus type 2, uncontrolled, with  complications ([place]) 50/11/3816  . Hyperlipidemia 01/19/2007  . GOUT 12/01/2006  . Essential hypertension 12/01/2006  . OBESITY, HX OF 12/01/2006    Labs  12/06/2014.  Sodium 140 potassium 3.9 BUN 13 creatinine 0.86.  11/27/2014.  Sodium 141 potassium 3.2 BUN 13 creatinine 0.8.  11/14/2014.  WBC 9.3 hemoglobin 13.7 platelets 232.  Liver function tests within normal limits.  Hemoglobin A1c 7.2.  .  08/25/2014.  Sodium 139 potassium 3.6 BUN 24 creatinine 1.0.  08/22/2014.  WBC 9.4 hemoglobin 15.4 platelets 232.    CBC    Component Value Date/Time   WBC 9.4 08/22/2014 1423   WBC 9.7 08/20/2014 0620   RBC 5.02 08/20/2014 0620   HGB 15.4 08/22/2014 1423   HCT 47 08/22/2014 1423   PLT 232 08/22/2014 1423   MCV 87.6 08/20/2014 0620   LYMPHSABS 1.6 08/18/2014 2026   MONOABS 0.5 08/18/2014 2026   EOSABS 0.0 08/18/2014 2026   BASOSABS 0.0 08/18/2014 2026    CMP     Component Value Date/Time   NA 136* 08/22/2014   NA 135 08/21/2014 0623   K 3.6 08/22/2014 1423   CL 101 08/21/2014 0623   CO2 21* 08/21/2014 0623   GLUCOSE 139* 08/21/2014 0623   BUN 19 08/22/2014 1423   BUN 9 08/21/2014 0623   CREATININE 1.0 08/22/2014 1423   CREATININE 0.90 08/21/2014 0623   CALCIUM 8.8* 08/21/2014 0623   PROT 7.1 08/18/2014 2026   ALBUMIN 3.4* 08/18/2014 2026   AST 19 08/18/2014 2026   ALT 13* 08/18/2014 2026   ALKPHOS 75 08/18/2014 2026   BILITOT 0.9 08/18/2014 2026   GFRNONAA >60 08/21/2014 0623   GFRAA >60 08/21/2014 0623    Assessment and Plan   1 history of acute ischemic stroke-continues on aspirin for anticoagulation-continues also on a statin.  Carotid Doppler showed bilateral   1-39 percent ICA stenosis-he appears to be improving which is encouraging his speech has improved strength has improved on the left side as well.  History of atrial fibrillation continues on aspirin-this appears to be rate controlled he is on Lopressor as well for rate control.--He  does have pulses in the 50s at times but he is asymptomatic blood pressures have been stable at this point will monitor if he shows any signs of symptomatic bradycardia hypotension dizziness certainly will reconsider  but this does not appear to be the case  #3 diabetes type 2-he continues on Lantus insulin 10 units daily at bedtime as well as NovoLog sliding scale blood sugars in the morning appear to run largely in the higher 100s-later in the day there is variability from higher 100s to low 200s generally at this point will order a hemoglobin A1c to assess long-term control   #4-history of hypertension this appears to be somewhat of a challenge he is back on hydrochlorothiazide 12.5 mg a day this was added by Dr. Sheppard Coil previously We have titrated his lisinopril up to 40 mg a day-he is also on Lopressor 50 mg twice a day-his blood pressure still appeared to be somewhat elevated-will add Norvasc 2.5 mg a day and monitor  #5-hypokalemia-apparently this was aggressively repleted in the hospital because of presence of PVCs and nonsustained ventricular tachycardia in the hospital    Was thoughtt hypokalemia borderline lowmagnesium was contributing to this.--He is on magnesium supplementation Potassium in September was mildly depressed he is on a more aggressive regimen of potassium supplementation-will update a BMP as well as a magnesium level.  #6 history of alcohol abuse this has not been an issue during his stay here he is on folic acid and also was put on thiamine  #7-history of gout this has not been an issue during his stay here he is on allopurinol.  #8 hyperlipidemia he is on a statin recent liver function tests were unremarkable will update a fasting lipid panel  [id number]-of note greater than 40 minutes spent assessing patient-discussing his status with nursing staff-reviewing his chart-and coordinating and formulating a plan of care for numerous diagnoses-of note greater than 50% of  time spent coordinating plan of care    .Marland Kitchen    Erion Hermans C,

## 2015-01-01 DIAGNOSIS — I69354 Hemiplegia and hemiparesis following cerebral infarction affecting left non-dominant side: Secondary | ICD-10-CM | POA: Diagnosis not present

## 2015-01-01 DIAGNOSIS — R1312 Dysphagia, oropharyngeal phase: Secondary | ICD-10-CM | POA: Diagnosis not present

## 2015-01-08 ENCOUNTER — Encounter: Payer: Self-pay | Admitting: Internal Medicine

## 2015-01-08 NOTE — Progress Notes (Signed)
Patient ID: Juan [name], male   DOB: Jul 19, 1950, 63 y.o.   MRN: [id number]  This encounter was created in error - please disregard.

## 2015-01-09 DIAGNOSIS — R222 Localized swelling, mass and lump, trunk: Secondary | ICD-10-CM | POA: Diagnosis not present

## 2015-01-14 ENCOUNTER — Non-Acute Institutional Stay (SKILLED_NURSING_FACILITY): Payer: Medicare Other | Admitting: Internal Medicine

## 2015-01-14 ENCOUNTER — Encounter: Payer: Self-pay | Admitting: Internal Medicine

## 2015-01-14 DIAGNOSIS — R222 Localized swelling, mass and lump, trunk: Secondary | ICD-10-CM | POA: Diagnosis not present

## 2015-01-14 NOTE — Progress Notes (Signed)
MRN: [id number] Name: Juan [name]  Sex: male Age: 64 y.o. DOB: 12-06-1950  [place] #: [name] Facility/Room:307 Level Of Care: SNF Provider: Inocencio Homes D Emergency Contacts: Extended Emergency Contact Information Primary Emergency Contact: Fels,Margie A. Address: [place], [place] Phone: [phone] Work Phone: [phone] Relation: Spouse Secondary Emergency Contact: Real Cons States of [place] Phone: [phone] Relation: Son  Code Status:   Allergies: Review of patient's allergies indicates no known allergies.  Chief Complaint  Patient presents with  . Acute Visit    HPI: Patient is 64 y.o. male with HTN, HLD, DM2 and s/sp CVA with L side weakness  who is being seen for an abnormality to His R chest wall. Pt admits it has been there for some several years and has gotten larger in post months. Pt denies that it hurts either to touch or to breathe. Nothing makes it better or worse.  Past Medical History  Diagnosis Date  . Hypertension   . Gout   . Obesity   . Hyperlipidemia   . Diabetes mellitus     type II  . Paroxysmal atrial fibrillation (HCC)     postoperatively  . DIABETES MELLITUS, TYPE II 07/27/2007  . GOUT 12/01/2006  . HYPERLIPIDEMIA 01/19/2007  . HYPERTENSION 12/01/2006  . 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 OBESITY, HX OF 12/01/2006  . Cataract   . Hx of colonic polyps 05/30/2008    Past Surgical History  Procedure Laterality Date  . Cholecystectomy      Laparoscopic  . Cataract extraction      bilateral  . Colonoscopy    . Tee without cardioversion N/A 08/20/2014    Procedure: TRANSESOPHAGEAL ECHOCARDIOGRAM (TEE);  Surgeon: Sueanne Margarita, MD;  Location: [facility] ENDOSCOPY;  Service: Cardiovascular;  Laterality: N/A;  PT NEED A LOOP      Medication List       This list is accurate as of: 01/14/15 11:59 PM.  Always use your most recent med list.                allopurinol 100 MG tablet  Commonly known as:  ZYLOPRIM  Take 1 tablet (100 mg total) by mouth daily.     aspirin 325 MG tablet  Take 1 tablet (325 mg total) by mouth daily.     atorvastatin 80 MG tablet  Commonly known as:  LIPITOR  Take 1 tablet (80 mg total) by mouth daily.     hydrochlorothiazide 12.5 MG capsule  Commonly known as:  MICROZIDE  Take 12.5 mg by mouth daily.     insulin aspart 100 UNIT/ML injection  Commonly known as:  novoLOG  Three times a day prior to meals based on following sliding scale. CBG < 70: implement hypoglycemia protocol CBG 70 - 120: 0 units CBG 121 - 150: 2 units CBG 151 - 200: 3 units CBG 201 - 250: 5 units CBG 251 - 300: 8 units and call MD     insulin glargine 100 UNIT/ML injection  Commonly known as:  LANTUS  Inject 0.1 mLs (10 Units total) into the skin at bedtime.     lisinopril 20 MG tablet  Commonly known as:  PRINIVIL,ZESTRIL  Take 1 tablet (20 mg total) by mouth daily.     magnesium oxide 400 (241.3 MG) MG tablet  Commonly known as:  MAG-OX  Take 1 tablet (400 mg total) by mouth 2 (two)  times daily.     metoprolol 50 MG tablet  Commonly known as:  LOPRESSOR  Take 1 tablet (50 mg total) by mouth 2 (two) times daily with a meal.     potassium chloride SA 20 MEQ tablet  Commonly known as:  K-DUR,KLOR-CON  Take 20 mEq by mouth 2 (two) times daily.        No orders of the defined types were placed in this encounter.    Immunization History  Administered Date(s) Administered  . Influenza Split 02/25/2011  . Influenza Whole 03/07/1997, 01/19/2007, 01/24/2008, 01/19/2009, 01/19/2010  . Pneumococcal Conjugate-13 12/04/2013  . Pneumococcal Polysaccharide-23 01/24/2008  . Td 03/07/1993, 01/24/2008  . Zoster 01/19/2010    Social History  Substance Use Topics  . Smoking status: Current Every Day Smoker    Types: Pipe  . Smokeless tobacco: Never Used     Comment: occasional  . Alcohol Use: 0.0 oz/week    0 Standard drinks  or equivalent per week     Comment: 2 drinks a day-beer    Review of Systems  DATA OBTAINED: from patient GENERAL:  no fevers, fatigue, appetite changes SKIN: No itching, rash HEENT: No complaint RESPIRATORY: No cough, wheezing, SOB CARDIAC: No chest pain, palpitations, lower extremity edema  GI: No abdominal pain, No N/V/D or constipation, No heartburn or reflux  GU: No dysuria, frequency or urgency, or incontinence  MUSCULOSKELETAL: No unrelieved bone/joint pain NEUROLOGIC: No headache, dizziness  PSYCHIATRIC: No overt anxiety or sadness  Filed Vitals:   01/14/15 1621  BP: 158/89  Pulse: 51  Temp: 97.2 F (36.2 C)  Resp: 18    Physical Exam  GENERAL APPEARANCE: Alert, conversant, No acute distress  SKIN: No diaphoresis rash, or wounds HEENT: Unremarkable RESPIRATORY: Breathing is even, unlabored. Lung sounds are clear  CHEST WALL - overlying R rib 5 or 6 making 3 cm section of anterior rib appear twice normal circumference. It is hard like bone and not related to skin which slides easily over it;not tender or spongy, no redness or heat. CARDIOVASCULAR: Heart RRR no murmurs, rubs or gallops. No peripheral edema  GASTROINTESTINAL: Abdomen is soft, non-tender, not distended w/ normal bowel sounds.  GENITOURINARY: Bladder non tender, not distended  MUSCULOSKELETAL: No abnormal joints or musculature NEUROLOGIC: Cranial nerves 2-12 grossly intact; L side weakness. PSYCHIATRIC: Mood and affect appropriate to situation, no behavioral issues  Patient Active Problem List   Diagnosis Date Noted  . Pain in joint, lower leg 11/13/2014  . Hypokalemia 08/22/2014  . Alcohol use ([place]) 08/22/2014  . NSVT (nonsustained ventricular tachycardia) ([place])   . Acute ischemic stroke ([place]) 08/18/2014  . Increased prostate specific antigen (PSA) velocity 02/25/2011  . Preventative health care 07/18/2010  . Atrial fibrillation ([place]) 03/09/2010  . 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 Hx of  colonic polyps 05/30/2008  . Diabetes mellitus type 2, uncontrolled, with complications ([place]) 93/23/5573  . Hyperlipidemia 01/19/2007  . GOUT 12/01/2006  . Essential hypertension 12/01/2006  . OBESITY, HX OF 12/01/2006    CBC    Component Value Date/Time   WBC 9.4 08/22/2014 1423   WBC 9.7 08/20/2014 0620   RBC 5.02 08/20/2014 0620   HGB 15.4 08/22/2014 1423   HCT 47 08/22/2014 1423   PLT 232 08/22/2014 1423   MCV 87.6 08/20/2014 0620   LYMPHSABS 1.6 08/18/2014 2026   MONOABS 0.5 08/18/2014 2026   EOSABS 0.0 08/18/2014 2026   BASOSABS 0.0 08/18/2014 2026    CMP     Component Value  Date/Time   NA 136* 08/22/2014   NA 135 08/21/2014 0623   K 3.6 08/22/2014 1423   CL 101 08/21/2014 0623   CO2 21* 08/21/2014 0623   GLUCOSE 139* 08/21/2014 0623   BUN 19 08/22/2014 1423   BUN 9 08/21/2014 0623   CREATININE 1.0 08/22/2014 1423   CREATININE 0.90 08/21/2014 0623   CALCIUM 8.8* 08/21/2014 0623   PROT 7.1 08/18/2014 2026   ALBUMIN 3.4* 08/18/2014 2026   AST 19 08/18/2014 2026   ALT 13* 08/18/2014 2026   ALKPHOS 75 08/18/2014 2026   BILITOT 0.9 08/18/2014 2026   GFRNONAA >60 08/21/2014 0623   GFRAA >60 08/21/2014 0623    Assessment and Plan  R chest wall mass, in particular R anterior rib- enlarged hard, non tender mass; no abnormalty seen on plain chest film; rec CT chest to investigate   Time spent 25 min;> 50% of time with patient was spent reviewing records, labs, tests and studies, counseling and developing plan of care  Hennie Duos, MD

## 2015-01-16 DIAGNOSIS — I69320 Aphasia following cerebral infarction: Secondary | ICD-10-CM | POA: Diagnosis not present

## 2015-01-16 DIAGNOSIS — I69354 Hemiplegia and hemiparesis following cerebral infarction affecting left non-dominant side: Secondary | ICD-10-CM | POA: Diagnosis not present

## 2015-01-16 DIAGNOSIS — R1312 Dysphagia, oropharyngeal phase: Secondary | ICD-10-CM | POA: Diagnosis not present

## 2015-01-17 DIAGNOSIS — I69354 Hemiplegia and hemiparesis following cerebral infarction affecting left non-dominant side: Secondary | ICD-10-CM | POA: Diagnosis not present

## 2015-01-17 DIAGNOSIS — I69320 Aphasia following cerebral infarction: Secondary | ICD-10-CM | POA: Diagnosis not present

## 2015-01-17 DIAGNOSIS — R1312 Dysphagia, oropharyngeal phase: Secondary | ICD-10-CM | POA: Diagnosis not present

## 2015-01-18 DIAGNOSIS — I69320 Aphasia following cerebral infarction: Secondary | ICD-10-CM | POA: Diagnosis not present

## 2015-01-18 DIAGNOSIS — I69354 Hemiplegia and hemiparesis following cerebral infarction affecting left non-dominant side: Secondary | ICD-10-CM | POA: Diagnosis not present

## 2015-01-18 DIAGNOSIS — R1312 Dysphagia, oropharyngeal phase: Secondary | ICD-10-CM | POA: Diagnosis not present

## 2015-01-19 DIAGNOSIS — R1312 Dysphagia, oropharyngeal phase: Secondary | ICD-10-CM | POA: Diagnosis not present

## 2015-01-19 DIAGNOSIS — I69354 Hemiplegia and hemiparesis following cerebral infarction affecting left non-dominant side: Secondary | ICD-10-CM | POA: Diagnosis not present

## 2015-01-19 DIAGNOSIS — I69320 Aphasia following cerebral infarction: Secondary | ICD-10-CM | POA: Diagnosis not present

## 2015-01-20 DIAGNOSIS — R1312 Dysphagia, oropharyngeal phase: Secondary | ICD-10-CM | POA: Diagnosis not present

## 2015-01-20 DIAGNOSIS — I69320 Aphasia following cerebral infarction: Secondary | ICD-10-CM | POA: Diagnosis not present

## 2015-01-20 DIAGNOSIS — I69354 Hemiplegia and hemiparesis following cerebral infarction affecting left non-dominant side: Secondary | ICD-10-CM | POA: Diagnosis not present

## 2015-01-23 DIAGNOSIS — R1312 Dysphagia, oropharyngeal phase: Secondary | ICD-10-CM | POA: Diagnosis not present

## 2015-01-23 DIAGNOSIS — I69320 Aphasia following cerebral infarction: Secondary | ICD-10-CM | POA: Diagnosis not present

## 2015-01-23 DIAGNOSIS — I69354 Hemiplegia and hemiparesis following cerebral infarction affecting left non-dominant side: Secondary | ICD-10-CM | POA: Diagnosis not present

## 2015-01-25 DIAGNOSIS — R1312 Dysphagia, oropharyngeal phase: Secondary | ICD-10-CM | POA: Diagnosis not present

## 2015-01-25 DIAGNOSIS — I69320 Aphasia following cerebral infarction: Secondary | ICD-10-CM | POA: Diagnosis not present

## 2015-01-25 DIAGNOSIS — I69354 Hemiplegia and hemiparesis following cerebral infarction affecting left non-dominant side: Secondary | ICD-10-CM | POA: Diagnosis not present

## 2015-01-26 DIAGNOSIS — R1312 Dysphagia, oropharyngeal phase: Secondary | ICD-10-CM | POA: Diagnosis not present

## 2015-01-26 DIAGNOSIS — I69354 Hemiplegia and hemiparesis following cerebral infarction affecting left non-dominant side: Secondary | ICD-10-CM | POA: Diagnosis not present

## 2015-01-26 DIAGNOSIS — I69320 Aphasia following cerebral infarction: Secondary | ICD-10-CM | POA: Diagnosis not present

## 2015-01-27 DIAGNOSIS — R1312 Dysphagia, oropharyngeal phase: Secondary | ICD-10-CM | POA: Diagnosis not present

## 2015-01-27 DIAGNOSIS — I69354 Hemiplegia and hemiparesis following cerebral infarction affecting left non-dominant side: Secondary | ICD-10-CM | POA: Diagnosis not present

## 2015-01-27 DIAGNOSIS — I69320 Aphasia following cerebral infarction: Secondary | ICD-10-CM | POA: Diagnosis not present

## 2015-01-28 DIAGNOSIS — I69320 Aphasia following cerebral infarction: Secondary | ICD-10-CM | POA: Diagnosis not present

## 2015-01-28 DIAGNOSIS — I69354 Hemiplegia and hemiparesis following cerebral infarction affecting left non-dominant side: Secondary | ICD-10-CM | POA: Diagnosis not present

## 2015-01-28 DIAGNOSIS — R1312 Dysphagia, oropharyngeal phase: Secondary | ICD-10-CM | POA: Diagnosis not present

## 2015-01-30 DIAGNOSIS — I69354 Hemiplegia and hemiparesis following cerebral infarction affecting left non-dominant side: Secondary | ICD-10-CM | POA: Diagnosis not present

## 2015-01-30 DIAGNOSIS — R1312 Dysphagia, oropharyngeal phase: Secondary | ICD-10-CM | POA: Diagnosis not present

## 2015-01-30 DIAGNOSIS — I69320 Aphasia following cerebral infarction: Secondary | ICD-10-CM | POA: Diagnosis not present

## 2015-01-31 DIAGNOSIS — I69320 Aphasia following cerebral infarction: Secondary | ICD-10-CM | POA: Diagnosis not present

## 2015-01-31 DIAGNOSIS — R1312 Dysphagia, oropharyngeal phase: Secondary | ICD-10-CM | POA: Diagnosis not present

## 2015-01-31 DIAGNOSIS — I69354 Hemiplegia and hemiparesis following cerebral infarction affecting left non-dominant side: Secondary | ICD-10-CM | POA: Diagnosis not present

## 2015-02-01 DIAGNOSIS — I69354 Hemiplegia and hemiparesis following cerebral infarction affecting left non-dominant side: Secondary | ICD-10-CM | POA: Diagnosis not present

## 2015-02-01 DIAGNOSIS — I69320 Aphasia following cerebral infarction: Secondary | ICD-10-CM | POA: Diagnosis not present

## 2015-02-01 DIAGNOSIS — R1312 Dysphagia, oropharyngeal phase: Secondary | ICD-10-CM | POA: Diagnosis not present

## 2015-02-02 DIAGNOSIS — R1312 Dysphagia, oropharyngeal phase: Secondary | ICD-10-CM | POA: Diagnosis not present

## 2015-02-02 DIAGNOSIS — I69354 Hemiplegia and hemiparesis following cerebral infarction affecting left non-dominant side: Secondary | ICD-10-CM | POA: Diagnosis not present

## 2015-02-02 DIAGNOSIS — I69320 Aphasia following cerebral infarction: Secondary | ICD-10-CM | POA: Diagnosis not present

## 2015-02-03 DIAGNOSIS — R1312 Dysphagia, oropharyngeal phase: Secondary | ICD-10-CM | POA: Diagnosis not present

## 2015-02-03 DIAGNOSIS — I69320 Aphasia following cerebral infarction: Secondary | ICD-10-CM | POA: Diagnosis not present

## 2015-02-03 DIAGNOSIS — I69354 Hemiplegia and hemiparesis following cerebral infarction affecting left non-dominant side: Secondary | ICD-10-CM | POA: Diagnosis not present

## 2015-02-04 DIAGNOSIS — I69354 Hemiplegia and hemiparesis following cerebral infarction affecting left non-dominant side: Secondary | ICD-10-CM | POA: Diagnosis not present

## 2015-02-04 DIAGNOSIS — I69320 Aphasia following cerebral infarction: Secondary | ICD-10-CM | POA: Diagnosis not present

## 2015-02-04 DIAGNOSIS — R1312 Dysphagia, oropharyngeal phase: Secondary | ICD-10-CM | POA: Diagnosis not present

## 2015-02-05 DIAGNOSIS — I69320 Aphasia following cerebral infarction: Secondary | ICD-10-CM | POA: Diagnosis not present

## 2015-02-05 DIAGNOSIS — R1312 Dysphagia, oropharyngeal phase: Secondary | ICD-10-CM | POA: Diagnosis not present

## 2015-02-05 DIAGNOSIS — I69354 Hemiplegia and hemiparesis following cerebral infarction affecting left non-dominant side: Secondary | ICD-10-CM | POA: Diagnosis not present

## 2015-02-06 DIAGNOSIS — R1312 Dysphagia, oropharyngeal phase: Secondary | ICD-10-CM | POA: Diagnosis not present

## 2015-02-06 DIAGNOSIS — I69354 Hemiplegia and hemiparesis following cerebral infarction affecting left non-dominant side: Secondary | ICD-10-CM | POA: Diagnosis not present

## 2015-02-06 DIAGNOSIS — I69320 Aphasia following cerebral infarction: Secondary | ICD-10-CM | POA: Diagnosis not present

## 2015-02-07 DIAGNOSIS — I69354 Hemiplegia and hemiparesis following cerebral infarction affecting left non-dominant side: Secondary | ICD-10-CM | POA: Diagnosis not present

## 2015-02-07 DIAGNOSIS — R1312 Dysphagia, oropharyngeal phase: Secondary | ICD-10-CM | POA: Diagnosis not present

## 2015-02-07 DIAGNOSIS — I69320 Aphasia following cerebral infarction: Secondary | ICD-10-CM | POA: Diagnosis not present

## 2015-02-08 DIAGNOSIS — R1312 Dysphagia, oropharyngeal phase: Secondary | ICD-10-CM | POA: Diagnosis not present

## 2015-02-08 DIAGNOSIS — I69354 Hemiplegia and hemiparesis following cerebral infarction affecting left non-dominant side: Secondary | ICD-10-CM | POA: Diagnosis not present

## 2015-02-08 DIAGNOSIS — I69320 Aphasia following cerebral infarction: Secondary | ICD-10-CM | POA: Diagnosis not present

## 2015-02-09 DIAGNOSIS — I69320 Aphasia following cerebral infarction: Secondary | ICD-10-CM | POA: Diagnosis not present

## 2015-02-09 DIAGNOSIS — I69354 Hemiplegia and hemiparesis following cerebral infarction affecting left non-dominant side: Secondary | ICD-10-CM | POA: Diagnosis not present

## 2015-02-09 DIAGNOSIS — R1312 Dysphagia, oropharyngeal phase: Secondary | ICD-10-CM | POA: Diagnosis not present

## 2015-02-10 DIAGNOSIS — I69354 Hemiplegia and hemiparesis following cerebral infarction affecting left non-dominant side: Secondary | ICD-10-CM | POA: Diagnosis not present

## 2015-02-10 DIAGNOSIS — R1312 Dysphagia, oropharyngeal phase: Secondary | ICD-10-CM | POA: Diagnosis not present

## 2015-02-10 DIAGNOSIS — I69320 Aphasia following cerebral infarction: Secondary | ICD-10-CM | POA: Diagnosis not present

## 2015-02-11 DIAGNOSIS — I69354 Hemiplegia and hemiparesis following cerebral infarction affecting left non-dominant side: Secondary | ICD-10-CM | POA: Diagnosis not present

## 2015-02-11 DIAGNOSIS — I69320 Aphasia following cerebral infarction: Secondary | ICD-10-CM | POA: Diagnosis not present

## 2015-02-11 DIAGNOSIS — R1312 Dysphagia, oropharyngeal phase: Secondary | ICD-10-CM | POA: Diagnosis not present

## 2015-02-12 DIAGNOSIS — I69320 Aphasia following cerebral infarction: Secondary | ICD-10-CM | POA: Diagnosis not present

## 2015-02-12 DIAGNOSIS — R1312 Dysphagia, oropharyngeal phase: Secondary | ICD-10-CM | POA: Diagnosis not present

## 2015-02-12 DIAGNOSIS — I69354 Hemiplegia and hemiparesis following cerebral infarction affecting left non-dominant side: Secondary | ICD-10-CM | POA: Diagnosis not present

## 2015-02-13 DIAGNOSIS — I69354 Hemiplegia and hemiparesis following cerebral infarction affecting left non-dominant side: Secondary | ICD-10-CM | POA: Diagnosis not present

## 2015-02-13 DIAGNOSIS — R1312 Dysphagia, oropharyngeal phase: Secondary | ICD-10-CM | POA: Diagnosis not present

## 2015-02-13 DIAGNOSIS — I69320 Aphasia following cerebral infarction: Secondary | ICD-10-CM | POA: Diagnosis not present

## 2015-02-14 DIAGNOSIS — R1312 Dysphagia, oropharyngeal phase: Secondary | ICD-10-CM | POA: Diagnosis not present

## 2015-02-14 DIAGNOSIS — I69354 Hemiplegia and hemiparesis following cerebral infarction affecting left non-dominant side: Secondary | ICD-10-CM | POA: Diagnosis not present

## 2015-02-14 DIAGNOSIS — I69320 Aphasia following cerebral infarction: Secondary | ICD-10-CM | POA: Diagnosis not present

## 2015-02-15 DIAGNOSIS — I69354 Hemiplegia and hemiparesis following cerebral infarction affecting left non-dominant side: Secondary | ICD-10-CM | POA: Diagnosis not present

## 2015-02-15 DIAGNOSIS — R1312 Dysphagia, oropharyngeal phase: Secondary | ICD-10-CM | POA: Diagnosis not present

## 2015-02-15 DIAGNOSIS — I69320 Aphasia following cerebral infarction: Secondary | ICD-10-CM | POA: Diagnosis not present

## 2015-02-16 DIAGNOSIS — I69354 Hemiplegia and hemiparesis following cerebral infarction affecting left non-dominant side: Secondary | ICD-10-CM | POA: Diagnosis not present

## 2015-02-16 DIAGNOSIS — I69320 Aphasia following cerebral infarction: Secondary | ICD-10-CM | POA: Diagnosis not present

## 2015-02-16 DIAGNOSIS — R1312 Dysphagia, oropharyngeal phase: Secondary | ICD-10-CM | POA: Diagnosis not present

## 2015-02-17 DIAGNOSIS — R1312 Dysphagia, oropharyngeal phase: Secondary | ICD-10-CM | POA: Diagnosis not present

## 2015-02-17 DIAGNOSIS — I69320 Aphasia following cerebral infarction: Secondary | ICD-10-CM | POA: Diagnosis not present

## 2015-02-17 DIAGNOSIS — I69354 Hemiplegia and hemiparesis following cerebral infarction affecting left non-dominant side: Secondary | ICD-10-CM | POA: Diagnosis not present

## 2015-02-18 DIAGNOSIS — R1312 Dysphagia, oropharyngeal phase: Secondary | ICD-10-CM | POA: Diagnosis not present

## 2015-02-18 DIAGNOSIS — I69320 Aphasia following cerebral infarction: Secondary | ICD-10-CM | POA: Diagnosis not present

## 2015-02-18 DIAGNOSIS — I69354 Hemiplegia and hemiparesis following cerebral infarction affecting left non-dominant side: Secondary | ICD-10-CM | POA: Diagnosis not present

## 2015-02-19 ENCOUNTER — Encounter: Payer: Self-pay | Admitting: Internal Medicine

## 2015-02-19 ENCOUNTER — Non-Acute Institutional Stay (SKILLED_NURSING_FACILITY): Payer: Medicare Other | Admitting: Internal Medicine

## 2015-02-19 DIAGNOSIS — E785 Hyperlipidemia, unspecified: Secondary | ICD-10-CM

## 2015-02-19 DIAGNOSIS — I482 Chronic atrial fibrillation, unspecified: Secondary | ICD-10-CM

## 2015-02-19 DIAGNOSIS — M25561 Pain in right knee: Secondary | ICD-10-CM

## 2015-02-19 DIAGNOSIS — Z8673 Personal history of transient ischemic attack (TIA), and cerebral infarction without residual deficits: Secondary | ICD-10-CM

## 2015-02-19 DIAGNOSIS — R600 Localized edema: Secondary | ICD-10-CM | POA: Diagnosis not present

## 2015-02-19 DIAGNOSIS — I10 Essential (primary) hypertension: Secondary | ICD-10-CM

## 2015-02-19 DIAGNOSIS — I69320 Aphasia following cerebral infarction: Secondary | ICD-10-CM | POA: Diagnosis not present

## 2015-02-19 DIAGNOSIS — I69354 Hemiplegia and hemiparesis following cerebral infarction affecting left non-dominant side: Secondary | ICD-10-CM | POA: Diagnosis not present

## 2015-02-19 DIAGNOSIS — Z789 Other specified health status: Secondary | ICD-10-CM | POA: Diagnosis not present

## 2015-02-19 DIAGNOSIS — R1312 Dysphagia, oropharyngeal phase: Secondary | ICD-10-CM | POA: Diagnosis not present

## 2015-02-19 DIAGNOSIS — M79604 Pain in right leg: Secondary | ICD-10-CM | POA: Diagnosis not present

## 2015-02-19 DIAGNOSIS — Z7289 Other problems related to lifestyle: Secondary | ICD-10-CM

## 2015-02-19 NOTE — Progress Notes (Signed)
Patient ID: Juan [name], male   DOB: [birth year], 64 y.o.   MRN: [id number]   MRN: [id number] Name: Juan [name]  Sex: male Age: 64 y.o. DOB: [birth year]  [place] #: [name] Facility/Room:100 Level Of Care: SNF Provider: Wille Celeste Emergency Contacts: Extended Emergency Contact Information Primary Emergency Contact: Dieckman,Margie A. Address: [place], [place] Phone: [phone] Work Phone: [phone] Relation: Spouse Secondary Emergency Contact: Real [place] of [place] Phone: [phone] Relation: Son  Code Status:   Allergies: Review of patient's allergies indicates no known allergies.  Chief Complaint  Patient presents with  . Medical Management of Chronic Issues  . Acute Visit   secondary to right knee pain area  Medical management of chronic medical issues including history of CVA diabetes type 2 hypertension depression hyperlipidemia  HPI: Patient is 64 y.o. male who who is admitted to SNF for OT/PT after being admitted to hospital for acute CVA with L side weakness and uncontrolled DM.--This appeared to have stabilized he has gained strength in his speech has improved-   Most acute issue recently was elevated blood pressure Dr. Sheppard Coil did start him on low dose hydrochlorothiazide which apparently he had been on previously Blood pressures continued to be somewhat elevated and his lisinopril has been increased to 40 mg a day-and recently Norvasc 2.5 mg a day was added.  Blood pressures appear to be somewhat improved I got 130/78 today-I do see previous readings with systolics in the [id number] will have to be monitored and if fairly consistently elevated we will increase the Norvasc.   Marland Kitchen  He is often bradycardic with pulse in the 50s he does have a history of atrial fibrillation he is on Lopressor 50 mg twice a day-he does not appear to be symptomatic- Per review of recent  pulse readings vary from the 50s-up to the 70's--there are orders to hold Lopressor for pulse less than 60 he does not appear to be symptomatic  Also back in September showed a potassium of 3.2 he is now on potassium supplementation-his potassium has normalized per most recent lab 3.6 on Oct 27  When I originally saw him in September he did complain of some leg spasms initially in the morning when he got out of bed-he is not complaining of this anymore however he is complaining of some right knee pain he says this has been going on for a while-he does not complain of any recent trauma-  He does have a history diabetes type 2 he is on Lantus 10 units daily-he is also on sliding scale insulin-blood sugars appear to be in the  100s to  200s generally occasionally will have a spike above 300 but this is not common per nursing his hemoglobin A1c back in September was 7.2--we'll update this  Patient also has had a "lump"-the right lower thorax area-x-ray of this did not show any acute process-Dr. Sheppard Coil did evaluate this as well and ordered a CT-I do not see those results speaking with nursing I don't believe this has been done yet will write an order to make sure this is followed up on he thinks the area had actually has gotten smaller       Past Medical History  Diagnosis Date  . Hypertension   . Gout   . Obesity   . Hyperlipidemia   . Diabetes mellitus     type II  .  Paroxysmal atrial fibrillation (HCC)     postoperatively  . DIABETES MELLITUS, TYPE II 07/27/2007  . GOUT 12/01/2006  . HYPERLIPIDEMIA 01/19/2007  . HYPERTENSION 12/01/2006  . 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 OBESITY, HX OF 12/01/2006  . Cataract   . Hx of colonic polyps 05/30/2008    Past Surgical History  Procedure Laterality Date  . Cholecystectomy      Laparoscopic  . Cataract extraction      bilateral  . Colonoscopy    . Tee without cardioversion N/A 08/20/2014    Procedure: TRANSESOPHAGEAL ECHOCARDIOGRAM  (TEE);  Surgeon: Sueanne Margarita, MD;  Location: [name] ENDOSCOPY;  Service: Cardiovascular;  Laterality: N/A;  PT NEED A LOOP      Medication List       This list is accurate as of: 02/19/15 11:59 PM.  Always use your most recent med list.               allopurinol 100 MG tablet  Commonly known as:  ZYLOPRIM  Take 1 tablet (100 mg total) by mouth daily.     amLODipine 2.5 MG tablet  Commonly known as:  NORVASC  Take 2.5 mg by mouth daily.     aspirin 325 MG tablet  Take 1 tablet (325 mg total) by mouth daily.     atorvastatin 80 MG tablet  Commonly known as:  LIPITOR  Take 1 tablet (80 mg total) by mouth daily.     hydrochlorothiazide 12.5 MG capsule  Commonly known as:  MICROZIDE  Take 12.5 mg by mouth daily.     insulin aspart 100 UNIT/ML injection  Commonly known as:  novoLOG  Three times a day prior to meals based on following sliding scale. CBG < 70: implement hypoglycemia protocol CBG 70 - 120: 0 units CBG 121 - 150: 2 units CBG 151 - 200: 3 units CBG 201 - 250: 5 units CBG 251 - 300: 8 units and call MD     insulin glargine 100 UNIT/ML injection  Commonly known as:  LANTUS  Inject 0.1 mLs (10 Units total) into the skin at bedtime.     lisinopril 20 MG tablet  Commonly known as:  PRINIVIL,ZESTRIL  Take 1 tablet (20 mg total) by mouth daily.     magnesium oxide 400 (241.3 MG) MG tablet  Commonly known as:  MAG-OX  Take 1 tablet (400 mg total) by mouth 2 (two) times daily.     metoprolol 50 MG tablet  Commonly known as:  LOPRESSOR  Take 1 tablet (50 mg total) by mouth 2 (two) times daily with a meal.     potassium chloride SA 20 MEQ tablet  Commonly known as:  K-DUR,KLOR-CON  Take 20 mEq by mouth 2 (two) times daily.       of note his hydrochlorothiazide 12.5 mg a day has been restarted    Immunization History  Administered Date(s) Administered  . Influenza Split 02/25/2011  . Influenza Whole 03/07/1997, 01/19/2007, 01/24/2008, 01/19/2009, 01/19/2010   . Pneumococcal Conjugate-13 12/04/2013  . Pneumococcal Polysaccharide-23 01/24/2008  . Td 03/07/1993, 01/24/2008  . Zoster 01/19/2010    Social History  Substance Use Topics  . Smoking status: Current Every Day Smoker    Types: Pipe  . Smokeless tobacco: Never Used     Comment: occasional  . Alcohol Use: 0.0 oz/week    0 Standard drinks or equivalent per week     Comment: 2 drinks a day-beer    Family history is noncontributory  Review of Systems  DATA OBTAINED: from nurse, medical record, patient [name]:  no fevers, fatigue, appetite changes SKIN: No itching, rash or wounds EYES: No eye pain, redness, discharge EARS: No earache, tinnitus, change in hearing NOSE: No congestion, drainage or bleeding  MOUTH/THROAT: No mouth or tooth pain, No sore throat RESPIRATORY: No cough, wheezing, SOB CARDIAC: No chest pain, palpitations, lower extremity edema  GI: No abdominal pain, No N/V/D or constipation, No heartburn or reflux  GU: No dysuria, frequency or urgency, or incontinence  MUSCULOSKELETAL: complaining of right knee pain this is more with motion and movement-also has area right thorax area as noted above NEUROLOGIC: He has gained strength had initially some left-sided weakness still has some mild weakness but is doing significantly better here has worked with physical therapy PSYCHIATRIC:  No behavior issue.     Physical Exam Temperature 97.0 pulse 60 respirations 18 blood pressure 130/78 taken manually [name] APPEARANCE: Pleasant male in no distress lying comfortably in bed  SKIN: No diaphoresis rash--patient has a small lump type area right lateral thorax area he feels the area actually has moved somewhat laterally over time and gotten smaller it does not appear to be acutely tender HEAD: Normocephalic, atraumatic  EYES: Conjunctiva/lids clear. Pupils round, reactive. EOMs intact.  EARS: External exam WNL, canals clear. Hearing grossly normal.  NOSE: No deformity  or discharge.  MOUTH/THROAT: Oropharynx is clear mucous membranes moist RESPIRATORY: Breathing is even, unlabored. Lung sounds are clear   CARDIOVASCULAR: Heart RRR slightly   no murmurs, rubs or gallops. No peripheral edema.   GASTROINTESTINAL: Abdomen is soft, non-tender, not distended w/ normal bowel sounds.   MUSCULOSKELETAL: There is some mild edema to his right knee with some pain with flexion and extension of the knee the edema is fairly cool to touch there is no erythema or for what I can tell increased warmth there is some minimal tenderness to the area --does have a history of mild left-sided weakness he is able to move all extremities 4 lift his arm grip strength appears to be intact bilaterally also able to lift legs bilaterally  NEUROLOGIC:  R side facial weakness, L side body weakness which appears to be gradually improving  PSYCHIATRIC:  no behavioral issues  Patient Active Problem List   Diagnosis Date Noted  . Pain in joint, lower leg 11/13/2014  . Hypokalemia 08/22/2014  . Alcohol use ([place]) 08/22/2014  . NSVT (nonsustained ventricular tachycardia) ([place])   . Acute ischemic stroke ([place]) 08/18/2014  . Increased prostate specific antigen (PSA) velocity 02/25/2011  . Preventative health care 07/18/2010  . Atrial fibrillation ([place]) 03/09/2010  . 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 Hx of colonic polyps 05/30/2008  . Diabetes mellitus type 2, uncontrolled, with complications ([place]) [id number]  . Hyperlipidemia 01/19/2007  . GOUT 12/01/2006  . Essential hypertension 12/01/2006  . OBESITY, HX OF 12/01/2006    Labs  01/01/2015.  Sodium 141 potassium 3.6 BUN 17 creatinine 0.8.  HDL 26-LDL 62-cholesterol 117-.  Magnesium 1.7.  WBC 9.7 hemoglobin 15.1 platelets 249  12/06/2014.  Sodium 140 potassium 3.9 BUN 13 creatinine 0.86.  11/27/2014.  Sodium 141 potassium 3.2 BUN 13 creatinine 0.8.  11/14/2014.  WBC 9.3 hemoglobin 13.7 platelets 232.  Liver function  tests within normal limits.  Hemoglobin A1c 7.2.  .  08/25/2014.  Sodium 139 potassium 3.6 BUN 24 creatinine 1.0.  08/22/2014.  WBC 9.4 hemoglobin 15.4 platelets 232.    CBC    Component Value Date/Time   WBC 9.4  08/22/2014 1423   WBC 9.7 08/20/2014 0620   RBC 5.02 08/20/2014 0620   HGB 15.4 08/22/2014 1423   HCT 47 08/22/2014 1423   PLT 232 08/22/2014 1423   MCV 87.6 08/20/2014 0620   LYMPHSABS 1.6 08/18/2014 2026   MONOABS 0.5 08/18/2014 2026   EOSABS 0.0 08/18/2014 2026   BASOSABS 0.0 08/18/2014 2026    CMP     Component Value Date/Time   NA 136* 08/22/2014   NA 135 08/21/2014 0623   K 3.6 08/22/2014 1423   CL 101 08/21/2014 0623   CO2 21* 08/21/2014 0623   GLUCOSE 139* 08/21/2014 0623   BUN 19 08/22/2014 1423   BUN 9 08/21/2014 0623   CREATININE 1.0 08/22/2014 1423   CREATININE 0.90 08/21/2014 0623   CALCIUM 8.8* 08/21/2014 0623   PROT 7.1 08/18/2014 2026   ALBUMIN 3.4* 08/18/2014 2026   AST 19 08/18/2014 2026   ALT 13* 08/18/2014 2026   ALKPHOS 75 08/18/2014 2026   BILITOT 0.9 08/18/2014 2026   GFRNONAA >60 08/21/2014 0623   GFRAA >60 08/21/2014 0623    Assessment and Plan   1 history of acute ischemic stroke-continues on aspirin for anticoagulation-continues also on a statin. This appears to be stable  Carotid Doppler showed bilateral   1-39 percent ICA stenosis-he appears to be improving which is encouraging his speech has improved strength has improved on the left side as well.  History of atrial fibrillation continues on aspirin-this appears to be rate controlled he is on Lopressor as well for rate control.--He does have pulses in the 50s at times but he is asymptomatic blood pressures have been stable at this point will monitor if he shows any signs of symptomatic bradycardia hypotension dizziness certainly will reconsider but this does not appear to be the case--ordered to hold Lopressor for pulse less than 60  #3 diabetes type 2-he continues  on Lantus insulin 10 units daily at bedtime as well as NovoLog sliding scale Blood sugars are variable I suspect dietary habits later in the day contributed higher blood sugars at times-will update a hemoglobin A1c  #4-history of hypertension this appears to be somewhat of a challenge he is back on hydrochlorothiazide 12.5 mg a day this was added by Dr. Sheppard Coil previously We have titrated his lisinopril up to 40 mg a day-he is also on Lopressor 50 mg twice a day and Norvasc 2.5 mg a day-his blood pressure s appear to be improving I got 130/78 manually-would like to get readings every day with a logl for review before making medication adjustments to see how he runs on a consistent basis-wi  #5-hypokalemia-apparently this was aggressively repleted in the hospital because of presence of PVCs and nonsustained ventricular tachycardia in the hospital--this has been stable on supplementation Will update a metabolic panel    Was thoughtt hypokalemia borderline lowmagnesium was contributing to this.--He is on magnesium supplementation and we will update a magnesium level as well    #6 history of alcohol abuse this has not been an issue during his stay here he is on folic acid and also was put on thiamine  #7-history of right knee pain-will obtain an x-ray-also with the edema will try to obtain a venous Doppler as well-will check lab work including a sedimentation rate and RA factor-this does not appear to be a gout presentation but he does have some history of this as well considering obtaining a uric acid as well as well     #8 hyperlipidemia he  is on a statin  --LDL at goal at 62 per lab on 01/01/2015-will update liver function tests they have been normal in the past--he is on a statin  #9-history of gout he is on allopurinol as noted above.  #10 history of right lateral thorax abnormality-again Dr.Alexander has ordered a CT scan will write an order to check on the status of this he believes it  is getting smaller    [id number]-of note greater than 40 minutes spent assessing patient-discussing his status with nursing staff--addressing his concerns at bedside--reviewing his chart-and coordinating and formulating a plan of care for numerous diagnoses-of note greater than 50% of time spent coordinating plan of care    .Marland Kitchen    Jaiyla Granados C,

## 2015-02-20 DIAGNOSIS — E08319 Diabetes mellitus due to underlying condition with unspecified diabetic retinopathy without macular edema: Secondary | ICD-10-CM | POA: Diagnosis not present

## 2015-02-20 DIAGNOSIS — R1312 Dysphagia, oropharyngeal phase: Secondary | ICD-10-CM | POA: Diagnosis not present

## 2015-02-20 DIAGNOSIS — I69320 Aphasia following cerebral infarction: Secondary | ICD-10-CM | POA: Diagnosis not present

## 2015-02-20 DIAGNOSIS — R220 Localized swelling, mass and lump, head: Secondary | ICD-10-CM | POA: Diagnosis not present

## 2015-02-20 DIAGNOSIS — I69354 Hemiplegia and hemiparesis following cerebral infarction affecting left non-dominant side: Secondary | ICD-10-CM | POA: Diagnosis not present

## 2015-02-21 DIAGNOSIS — I69354 Hemiplegia and hemiparesis following cerebral infarction affecting left non-dominant side: Secondary | ICD-10-CM | POA: Diagnosis not present

## 2015-02-21 DIAGNOSIS — R1312 Dysphagia, oropharyngeal phase: Secondary | ICD-10-CM | POA: Diagnosis not present

## 2015-02-21 DIAGNOSIS — I69320 Aphasia following cerebral infarction: Secondary | ICD-10-CM | POA: Diagnosis not present

## 2015-02-21 NOTE — Progress Notes (Signed)
Patient ID: Juan [name], male   DOB: 13-Feb-1951, 64 y.o.   MRN: [id number] Addendum to assessment and plan.  In regards to right knee pain we have started tramadol 50 mg every 6 hours when necessary for pain that is not relieved with the Tylenol he is currently receiving

## 2015-02-22 DIAGNOSIS — I69320 Aphasia following cerebral infarction: Secondary | ICD-10-CM | POA: Diagnosis not present

## 2015-02-22 DIAGNOSIS — R1312 Dysphagia, oropharyngeal phase: Secondary | ICD-10-CM | POA: Diagnosis not present

## 2015-02-22 DIAGNOSIS — I69354 Hemiplegia and hemiparesis following cerebral infarction affecting left non-dominant side: Secondary | ICD-10-CM | POA: Diagnosis not present

## 2015-02-23 DIAGNOSIS — I69354 Hemiplegia and hemiparesis following cerebral infarction affecting left non-dominant side: Secondary | ICD-10-CM | POA: Diagnosis not present

## 2015-02-23 DIAGNOSIS — E119 Type 2 diabetes mellitus without complications: Secondary | ICD-10-CM | POA: Diagnosis not present

## 2015-02-23 DIAGNOSIS — I69320 Aphasia following cerebral infarction: Secondary | ICD-10-CM | POA: Diagnosis not present

## 2015-02-23 DIAGNOSIS — R1312 Dysphagia, oropharyngeal phase: Secondary | ICD-10-CM | POA: Diagnosis not present

## 2015-02-24 DIAGNOSIS — R1312 Dysphagia, oropharyngeal phase: Secondary | ICD-10-CM | POA: Diagnosis not present

## 2015-02-24 DIAGNOSIS — I69354 Hemiplegia and hemiparesis following cerebral infarction affecting left non-dominant side: Secondary | ICD-10-CM | POA: Diagnosis not present

## 2015-02-24 DIAGNOSIS — I69320 Aphasia following cerebral infarction: Secondary | ICD-10-CM | POA: Diagnosis not present

## 2015-03-03 ENCOUNTER — Non-Acute Institutional Stay (SKILLED_NURSING_FACILITY): Payer: Medicare Other | Admitting: Internal Medicine

## 2015-03-03 ENCOUNTER — Encounter: Payer: Self-pay | Admitting: Internal Medicine

## 2015-03-03 DIAGNOSIS — I482 Chronic atrial fibrillation, unspecified: Secondary | ICD-10-CM

## 2015-03-03 DIAGNOSIS — M25561 Pain in right knee: Secondary | ICD-10-CM | POA: Diagnosis not present

## 2015-03-03 DIAGNOSIS — I10 Essential (primary) hypertension: Secondary | ICD-10-CM

## 2015-03-03 NOTE — Progress Notes (Signed)
Patient ID: Juan [name], male   DOB: 1951-01-23, 64 y.o.   MRN: [id number]    MRN: [id number] Name: Juan [name]  Sex: male Age: 64 y.o. DOB: [birth year]  [place] #: [name] Facility/Room:100 Level Of Care: SNF Provider: Wille Celeste Emergency Contacts: Extended Emergency Contact Information Primary Emergency Contact: Juan A. Address: [place], [place] Phone: [phone] Work Phone: [phone] Relation: Spouse Secondary Emergency Contact: [place] Phone: [phone] Relation: Son  Code Status:   Allergies: Review of patient's allergies indicates no known allergies.  Chief Complaint  Patient presents with  . Acute Visit   follow-up continued right knee pain-follow-up hypertension    HPI: Patient is 64 y.o. male who who is admitted to SNF for OT/PT after being admitted to hospital for acute CVA with L side weakness and uncontrolled DM.--This appeared to have stabilized he has gained strength in his speech has improved-   Most acute issue includes elevated blood pressure Dr. Sheppard Coil did start him on low dose hydrochlorothiazide which apparently he had been on previously Blood pressures continued to be somewhat elevated and his lisinopril has been increased to 40 mg a day-and recently Norvasc 2.5 mg a day was added--pyrosis systolic of 0000000 during last visit-.  I have reviewed septal blood pressures listed ones include 160/80-157/83-141/79-112/72-130/82.  I did take it manually today and got 146/86.  Marland Kitchen    Also back in September showed a potassium of 3.2 he is now on potassium supplementation-his potassium has normalized per most recent lab 3.6 on lab done on 02/20/2015 this is stable with the October lab  When I originally saw him in September he did complain of some leg spasms initially in the morning when he got out of bed-he is not complaining of this  anymore however he continues to complain  of some right knee pain he says this has been going on for a while- When I last saw him and did order x-ray which showed mild osteoarthritis-we also did a venous Doppler which did not show any acute process.  She has the tramadol that was prescribed when necessary is helping some but feels he would benefit more if this was given before the pain started-he feels when he is getting when necessary it's a bit more difficult because of pain has already started.  He has tolerated tramadol well apparently without any increased confusion or sedation  We also ordered a sedimentation rate and a uric acid level both which were unremarkable  .          Past Medical History  Diagnosis Date  . Hypertension   . Gout   . Obesity   . Hyperlipidemia   . Diabetes mellitus     type II  . Paroxysmal atrial fibrillation (HCC)     postoperatively  . DIABETES MELLITUS, TYPE II 07/27/2007  . GOUT 12/01/2006  . HYPERLIPIDEMIA 01/19/2007  . HYPERTENSION 12/01/2006  . 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 OBESITY, HX OF 12/01/2006  . Cataract   . Hx of colonic polyps 05/30/2008    Past Surgical History  Procedure Laterality Date  . Cholecystectomy      Laparoscopic  . Cataract extraction      bilateral  . Colonoscopy    . Tee without cardioversion N/A 08/20/2014    Procedure: TRANSESOPHAGEAL ECHOCARDIOGRAM (TEE);  Surgeon: Sueanne Margarita, MD;  Location: [facility];  Service: Cardiovascular;  Laterality: N/A;  PT NEED A LOOP      Medication List       This list is accurate as of: 03/03/15  3:10 PM.  Always use your most recent med list.               allopurinol 100 MG tablet  Commonly known as:  ZYLOPRIM  Take 1 tablet (100 mg total) by mouth daily.     amLODipine 2.5 MG tablet  Commonly known as:  NORVASC  Take 2.5 mg by mouth daily.     aspirin 325 MG tablet  Take 1 tablet (325 mg total) by mouth daily.     atorvastatin 80 MG tablet   Commonly known as:  LIPITOR  Take 1 tablet (80 mg total) by mouth daily.     hydrochlorothiazide 12.5 MG capsule  Commonly known as:  MICROZIDE  Take 12.5 mg by mouth daily.     insulin aspart 100 UNIT/ML injection  Commonly known as:  novoLOG  Three times a day prior to meals based on following sliding scale. CBG < 70: implement hypoglycemia protocol CBG 70 - 120: 0 units CBG 121 - 150: 2 units CBG 151 - 200: 3 units CBG 201 - 250: 5 units CBG 251 - 300: 8 units and call MD     insulin glargine 100 UNIT/ML injection  Commonly known as:  LANTUS  Inject 0.1 mLs (10 Units total) into the skin at bedtime.     lisinopril 20 MG tablet  Commonly known as:  PRINIVIL,ZESTRIL  Take 1 tablet (20 mg total) by mouth daily.     magnesium oxide 400 (241.3 MG) MG tablet  Commonly known as:  MAG-OX  Take 1 tablet (400 mg total) by mouth 2 (two) times daily.     metoprolol 50 MG tablet  Commonly known as:  LOPRESSOR  Take 1 tablet (50 mg total) by mouth 2 (two) times daily with a meal.     potassium chloride SA 20 MEQ tablet  Commonly known as:  K-DUR,KLOR-CON  Take 20 mEq by mouth 2 (two) times daily.     traMADol 50 MG tablet  Commonly known as:  ULTRAM  Take 50 mg by mouth 2 (two) times daily. Tramadol  50 mg   every 12 hours routine and every 6 hours when necessary       of note his hydrochlorothiazide 12.5 mg a day has been restarted    Immunization History  Administered Date(s) Administered  . Influenza Split 02/25/2011  . Influenza Whole 03/07/1997, 01/19/2007, 01/24/2008, 01/19/2009, 01/19/2010  . Pneumococcal Conjugate-13 12/04/2013  . Pneumococcal Polysaccharide-23 01/24/2008  . Td 03/07/1993, 01/24/2008  . Zoster 01/19/2010    Social History  Substance Use Topics  . Smoking status: Current Every Day Smoker    Types: Pipe  . Smokeless tobacco: Never Used     Comment: occasional  . Alcohol Use: 0.0 oz/week    0 Standard drinks or equivalent per week     Comment: 2  drinks a day-beer    Family history is noncontributory    Review of Systems  DATA OBTAINED: from nurse, medical record, patient [name]:  no fevers, fatigue, appetite changes SKIN: No itching, rash or wounds EYES: No eye pain, redness, discharge EARS: No earache, tinnitus, change in hearing NOSE: No congestion, drainage or bleeding  MOUTH/THROAT: No mouth or tooth pain, No sore throat RESPIRATORY: No cough, wheezing, SOB CARDIAC: No chest pain, palpitations, lower extremity edema  GI: No abdominal pain, No N/V/D or constipation, No heartburn or reflux  GU: No dysuria, frequency or urgency, or incontinence  MUSCULOSKELETAL: complaining of right knee pain this is more with motion and movement- NEUROLOGIC: He has gained strength had initially some left-sided weakness still has some mild weakness but is doing significantly better here has worked with physical therapy PSYCHIATRIC:  No behavior issue.     Physical Exam He is afebrile pulse 56 respirations 18 blood pressure taken manually 146/86 [name] APPEARANCE: Pleasant male in no distress lying comfortably in bed -  HEAD: Normocephalic, atraumatic  EYES: Conjunctiva/lids clear. Pupils round, reactive. EOMs intact.  EARS: External exam WNL, canals clear. Hearing grossly normal.  NOSE: No deformity or discharge.  MOUTH/THROAT: Oropharynx is clear mucous membranes moist RESPIRATORY: Breathing is even, unlabored. Lung sounds are clear somewhat shallow air entry   CARDIOVASCULAR: Heart RRR slightlybradycardic   no murmurs, rubs or gallops. No peripheral edema.   GASTROINTESTINAL: Abdomen is soft, non-tender, not distended w/ normal bowel sounds.   MUSCULOSKELETAL: Right knee appears fairly comparable to left knee--I do not see any sign of increased erythema or warmth-both knees appear to be slightly enlarged question small effusions but againhas nott really show up in the x-ray-x-ray showed mild osteoarthritis-there is pain with  flexion and extension of the right knee this does not really extend to the hip although patient-states at timesshe does have some mild hip discomfort --does have a history of mild left-sided weakness he is able to move all extremities 4 lift his arm grip strength appears to be intact bilaterally also able to lift legs bilaterally  NEUROLOGIC:  R side facial weakness, L side body weakness which appears to be gradually improving  PSYCHIATRIC:  no behavioral issues  Patient Active Problem List   Diagnosis Date Noted  . Pain in joint, lower leg 11/13/2014  . Hypokalemia 08/22/2014  . Alcohol use ([facility]) 08/22/2014  . NSVT (nonsustained ventricular tachycardia) ([facility])   . Acute ischemic stroke ([facility]) 08/18/2014  . Increased prostate specific antigen (PSA) velocity 02/25/2011  . Preventative health care 07/18/2010  . Atrial fibrillation ([place]) 03/09/2010  . 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 Hx of colonic polyps 05/30/2008  . Diabetes mellitus type 2, uncontrolled, with complications ([facility]) [id number]  . Hyperlipidemia 01/19/2007  . GOUT 12/01/2006  . Essential hypertension 12/01/2006  . OBESITY, HX OF 12/01/2006    Labs  02/23/2015.  Uric acid-4.4.  02/20/2015.  Sodium 141 potassium 3.6-BUN 13-creatinine 0.9.  Albumin 3.2.  Liver function tests within normal limits otherwise.  Sedimentation rate 14.  Magnesium level I.7.  WBC 7.3-hemoglobin 13.9 platelets -- 229  01/01/2015.  Sodium 141 potassium 3.6 BUN 17 creatinine 0.8.  HDL 26-LDL 62-cholesterol 117-.  Magnesium 1.7.  WBC 9.7 hemoglobin 15.1 platelets 249  12/06/2014.  Sodium 140 potassium 3.9 BUN 13 creatinine 0.86.  11/27/2014.  Sodium 141 potassium 3.2 BUN 13 creatinine 0.8.  11/14/2014.  WBC 9.3 hemoglobin 13.7 platelets 232.  Liver function tests within normal limits.  Hemoglobin A1c 7.2.  .  08/25/2014.  Sodium 139 potassium 3.6 BUN 24 creatinine 1.0.  08/22/2014.  WBC 9.4 hemoglobin  15.4 platelets 232.    CBC    Component Value Date/Time   WBC 9.4 08/22/2014 1423   WBC 9.7 08/20/2014 0620   RBC 5.02 08/20/2014 0620   HGB 15.4 08/22/2014 1423   HCT 47 08/22/2014 1423   PLT 232 08/22/2014 1423   MCV 87.6 08/20/2014 0620   LYMPHSABS 1.6 08/18/2014 2026  MONOABS 0.5 08/18/2014 2026   EOSABS 0.0 08/18/2014 2026   BASOSABS 0.0 08/18/2014 2026    CMP     Component Value Date/Time   NA 136* 08/22/2014   NA 135 08/21/2014 0623   K 3.6 08/22/2014 1423   CL 101 08/21/2014 0623   CO2 21* 08/21/2014 0623   GLUCOSE 139* 08/21/2014 0623   BUN 19 08/22/2014 1423   BUN 9 08/21/2014 0623   CREATININE 1.0 08/22/2014 1423   CREATININE 0.90 08/21/2014 0623   CALCIUM 8.8* 08/21/2014 0623   PROT 7.1 08/18/2014 2026   ALBUMIN 3.4* 08/18/2014 2026   AST 19 08/18/2014 2026   ALT 13* 08/18/2014 2026   ALKPHOS 75 08/18/2014 2026   BILITOT 0.9 08/18/2014 2026   GFRNONAA >60 08/21/2014 0623   GFRAA >60 08/21/2014 0623    Assessment and Plan   1 history of right knee pain-apparently this is persisting although he says the tramadol helps-x-ray which shows mild osteoarthritis.  He does report of some hip discomfort but this appears to be intermittent and mainly confined to the knee per exam today-nonetheless we will order an x-ray again of the knee as well as the hip-to rule out any referred pain here.  Also will make tramadol routine twice a day and every 6 hours when necessary-he feels a tramadol be more effective if given somewhat routine which is understandable   #2 -history of hypertension this appears to be somewhat of a challenge he is back on hydrochlorothiazide 12.5 mg a day this was added by Dr. Sheppard Coil previously We have titrated his lisinopril up to 40 mg a day-he is also on Lopressor 50 mg twice a day and Norvasc 2.5 mg a day- Per review of the readings  And one  I got today systolic blood pressure is somewhat elevated recently-will increase his Norvasc  up to 5 mg a day and continue to monitor blood pressures daily.--His pain may be contributing to this so will be somewhat conservative with titration of blood pressure medicines for now  #3 history of atrial fibrillation-this appears to be rate controlled occasionally bradycardic there are orders to hold the Lopressor for pulse less than 60-any episodic bradycardia appears to be asymptomatic-he does continue on aspirin for anticoagulation  [id number]              .Oscar La, Caliah Kopke C,

## 2015-03-04 DIAGNOSIS — M25551 Pain in right hip: Secondary | ICD-10-CM | POA: Diagnosis not present

## 2015-03-04 DIAGNOSIS — M25561 Pain in right knee: Secondary | ICD-10-CM | POA: Diagnosis not present

## 2015-03-20 DIAGNOSIS — M6249 Contracture of muscle, multiple sites: Secondary | ICD-10-CM | POA: Diagnosis not present

## 2015-03-20 DIAGNOSIS — I69354 Hemiplegia and hemiparesis following cerebral infarction affecting left non-dominant side: Secondary | ICD-10-CM | POA: Diagnosis not present

## 2015-03-20 DIAGNOSIS — F329 Major depressive disorder, single episode, unspecified: Secondary | ICD-10-CM | POA: Diagnosis not present

## 2015-03-20 DIAGNOSIS — I69320 Aphasia following cerebral infarction: Secondary | ICD-10-CM | POA: Diagnosis not present

## 2015-03-23 DIAGNOSIS — M6249 Contracture of muscle, multiple sites: Secondary | ICD-10-CM | POA: Diagnosis not present

## 2015-03-23 DIAGNOSIS — I69354 Hemiplegia and hemiparesis following cerebral infarction affecting left non-dominant side: Secondary | ICD-10-CM | POA: Diagnosis not present

## 2015-03-23 DIAGNOSIS — I69320 Aphasia following cerebral infarction: Secondary | ICD-10-CM | POA: Diagnosis not present

## 2015-03-24 DIAGNOSIS — I69354 Hemiplegia and hemiparesis following cerebral infarction affecting left non-dominant side: Secondary | ICD-10-CM | POA: Diagnosis not present

## 2015-03-24 DIAGNOSIS — M6249 Contracture of muscle, multiple sites: Secondary | ICD-10-CM | POA: Diagnosis not present

## 2015-03-24 DIAGNOSIS — I69320 Aphasia following cerebral infarction: Secondary | ICD-10-CM | POA: Diagnosis not present

## 2015-03-25 DIAGNOSIS — I69354 Hemiplegia and hemiparesis following cerebral infarction affecting left non-dominant side: Secondary | ICD-10-CM | POA: Diagnosis not present

## 2015-03-25 DIAGNOSIS — I69320 Aphasia following cerebral infarction: Secondary | ICD-10-CM | POA: Diagnosis not present

## 2015-03-25 DIAGNOSIS — M6249 Contracture of muscle, multiple sites: Secondary | ICD-10-CM | POA: Diagnosis not present

## 2015-03-26 DIAGNOSIS — I69320 Aphasia following cerebral infarction: Secondary | ICD-10-CM | POA: Diagnosis not present

## 2015-03-26 DIAGNOSIS — M6249 Contracture of muscle, multiple sites: Secondary | ICD-10-CM | POA: Diagnosis not present

## 2015-03-26 DIAGNOSIS — I69354 Hemiplegia and hemiparesis following cerebral infarction affecting left non-dominant side: Secondary | ICD-10-CM | POA: Diagnosis not present

## 2015-03-27 DIAGNOSIS — M6249 Contracture of muscle, multiple sites: Secondary | ICD-10-CM | POA: Diagnosis not present

## 2015-03-27 DIAGNOSIS — I69320 Aphasia following cerebral infarction: Secondary | ICD-10-CM | POA: Diagnosis not present

## 2015-03-27 DIAGNOSIS — I69354 Hemiplegia and hemiparesis following cerebral infarction affecting left non-dominant side: Secondary | ICD-10-CM | POA: Diagnosis not present

## 2015-03-30 ENCOUNTER — Other Ambulatory Visit: Payer: Self-pay | Admitting: *Deleted

## 2015-03-30 DIAGNOSIS — M6249 Contracture of muscle, multiple sites: Secondary | ICD-10-CM | POA: Diagnosis not present

## 2015-03-30 DIAGNOSIS — I69354 Hemiplegia and hemiparesis following cerebral infarction affecting left non-dominant side: Secondary | ICD-10-CM | POA: Diagnosis not present

## 2015-03-30 DIAGNOSIS — I69320 Aphasia following cerebral infarction: Secondary | ICD-10-CM | POA: Diagnosis not present

## 2015-03-30 MED ORDER — TRAMADOL HCL 50 MG PO TABS: ORAL_TABLET | ORAL | Status: DC

## 2015-03-30 NOTE — Telephone Encounter (Signed)
[facility] 

## 2015-03-31 DIAGNOSIS — I69320 Aphasia following cerebral infarction: Secondary | ICD-10-CM | POA: Diagnosis not present

## 2015-03-31 DIAGNOSIS — M6249 Contracture of muscle, multiple sites: Secondary | ICD-10-CM | POA: Diagnosis not present

## 2015-03-31 DIAGNOSIS — I69354 Hemiplegia and hemiparesis following cerebral infarction affecting left non-dominant side: Secondary | ICD-10-CM | POA: Diagnosis not present

## 2015-04-01 DIAGNOSIS — I69354 Hemiplegia and hemiparesis following cerebral infarction affecting left non-dominant side: Secondary | ICD-10-CM | POA: Diagnosis not present

## 2015-04-01 DIAGNOSIS — M6249 Contracture of muscle, multiple sites: Secondary | ICD-10-CM | POA: Diagnosis not present

## 2015-04-01 DIAGNOSIS — I69320 Aphasia following cerebral infarction: Secondary | ICD-10-CM | POA: Diagnosis not present

## 2015-04-02 DIAGNOSIS — M6249 Contracture of muscle, multiple sites: Secondary | ICD-10-CM | POA: Diagnosis not present

## 2015-04-02 DIAGNOSIS — I69320 Aphasia following cerebral infarction: Secondary | ICD-10-CM | POA: Diagnosis not present

## 2015-04-02 DIAGNOSIS — I69354 Hemiplegia and hemiparesis following cerebral infarction affecting left non-dominant side: Secondary | ICD-10-CM | POA: Diagnosis not present

## 2015-04-03 DIAGNOSIS — I69354 Hemiplegia and hemiparesis following cerebral infarction affecting left non-dominant side: Secondary | ICD-10-CM | POA: Diagnosis not present

## 2015-04-03 DIAGNOSIS — I69320 Aphasia following cerebral infarction: Secondary | ICD-10-CM | POA: Diagnosis not present

## 2015-04-03 DIAGNOSIS — M6249 Contracture of muscle, multiple sites: Secondary | ICD-10-CM | POA: Diagnosis not present

## 2015-04-06 DIAGNOSIS — M6249 Contracture of muscle, multiple sites: Secondary | ICD-10-CM | POA: Diagnosis not present

## 2015-04-06 DIAGNOSIS — I69354 Hemiplegia and hemiparesis following cerebral infarction affecting left non-dominant side: Secondary | ICD-10-CM | POA: Diagnosis not present

## 2015-04-06 DIAGNOSIS — I69320 Aphasia following cerebral infarction: Secondary | ICD-10-CM | POA: Diagnosis not present

## 2015-04-07 DIAGNOSIS — I69354 Hemiplegia and hemiparesis following cerebral infarction affecting left non-dominant side: Secondary | ICD-10-CM | POA: Diagnosis not present

## 2015-04-07 DIAGNOSIS — I69320 Aphasia following cerebral infarction: Secondary | ICD-10-CM | POA: Diagnosis not present

## 2015-04-07 DIAGNOSIS — M6249 Contracture of muscle, multiple sites: Secondary | ICD-10-CM | POA: Diagnosis not present

## 2015-04-08 DIAGNOSIS — I69320 Aphasia following cerebral infarction: Secondary | ICD-10-CM | POA: Diagnosis not present

## 2015-04-08 DIAGNOSIS — M6249 Contracture of muscle, multiple sites: Secondary | ICD-10-CM | POA: Diagnosis not present

## 2015-04-08 DIAGNOSIS — I69354 Hemiplegia and hemiparesis following cerebral infarction affecting left non-dominant side: Secondary | ICD-10-CM | POA: Diagnosis not present

## 2015-04-09 ENCOUNTER — Non-Acute Institutional Stay (SKILLED_NURSING_FACILITY): Payer: Medicare Other | Admitting: Internal Medicine

## 2015-04-09 ENCOUNTER — Encounter: Payer: Self-pay | Admitting: Internal Medicine

## 2015-04-09 DIAGNOSIS — R001 Bradycardia, unspecified: Secondary | ICD-10-CM | POA: Diagnosis not present

## 2015-04-09 DIAGNOSIS — I482 Chronic atrial fibrillation, unspecified: Secondary | ICD-10-CM

## 2015-04-09 DIAGNOSIS — Z8673 Personal history of transient ischemic attack (TIA), and cerebral infarction without residual deficits: Secondary | ICD-10-CM

## 2015-04-09 DIAGNOSIS — R1032 Left lower quadrant pain: Secondary | ICD-10-CM

## 2015-04-09 DIAGNOSIS — E118 Type 2 diabetes mellitus with unspecified complications: Secondary | ICD-10-CM | POA: Diagnosis not present

## 2015-04-09 DIAGNOSIS — R1030 Lower abdominal pain, unspecified: Secondary | ICD-10-CM

## 2015-04-09 NOTE — Progress Notes (Signed)
Patient ID: [name], male   DOB: 10-Mar-1950, 65 y.o.   MRN: [id number]    MRN: [id number] Name: Juan [name]  Sex: male Age: 65 y.o. DOB: [birth year]  [place] #: [name] Facility/Room:100 Level Of Care: SNF Provider: Wille Celeste Emergency Contacts: Extended Emergency Contact Information Primary Emergency Contact: Revels,Margie A. Address: [place], [place] Phone: [phone] Work Phone: [phone] Relation: Spouse Secondary Emergency Contact: Real Cons [place] of [place] Phone: [phone] Relation: Son  Code Status:   Allergies: Review of patient's allergies indicates no known allergies.  Chief Complaint  Patient presents with  . Medical Management of Chronic Issues     Medical management of chronic medical issues including history of CVA diabetes type 2 hypertension depression hyperlipidemia--osteoarthritis  HPI: Patient is 65 y.o. male who who is admitted to SNF for OT/PT after being admitted to hospital for acute CVA with L side weakness and uncontrolled DM.--This appeared to have stabilized he has gained strength in his speech has improved-   An issue recently was elevated blood pressure Dr. Sheppard Coil did start him on low dose hydrochlorothiazide which apparently he had been on previously Blood pressures continued to be somewhat elevated and his lisinopril has been increased to 40 mg a day-and recently Norvasc 2.5 mg a day was added.--This has been titrated up to 5 mg a day.  He is also on Lopressor 50 mg twice a day with a history of atrial fibrillation  Blood pressures appear to be somewhat improved I got 130/80 today-  previous list one 124/80-123/68-150/84-appears he has occasional readings above [id number] systolically but these do not appear to be consistent   .  He is  Bradycardic  At times with pulse in the 50s he does have a history of atrial fibrillation he is on  Lopressor 50 mg twice a day-he does not appear to be symptomatic- Per review of recent pulse readings vary from the 50s-up to the 80's--there are orders to hold Lopressor for pulse less than 60 he does not appear to be symptomatic  Also back in September showed a potassium of 3.2 he is now on potassium supplementation-his potassium has normalized per most recent lab 3.6 on 02/23/2015  When I originally saw him in September he did complain of some leg spasms initially in the morning when he got out of bed- He also complaining of some right knee pain as well-he says this is improved although apparently still has pain at times.  X-rays did show osteoarthritis but no acute process-venous Doppler as well was negative.  We have made his tramadol routinely twice a day and apparently this is helping some-he also has tramadol as needed every 6 hours  At times he will complained lately of some left groin-type pain he says this is only with movement he feels he may have pulled a muscle in that area  He does have a history diabetes type 2 he is on Lantus 10 units daily-he is also on sliding scale insulin- Hemoglobin A1c in December 2016 was 6.6 which actually shows improvement-blood sugars appear to be mainly in the lower mid 100s with an occasional reading above 200 but these appear to be fairly rare  Patient also has had a "lump"-the right lower thorax area-x-ray of this did not show any acute process--per patient this lump has become less prominent is not painful he feels that has moved actually  somewhat--Dr. Sheppard Coil has followed up on this-apparently a scan has been ordered-will speak with staff about if this is been done-- again the area actually appears to be less prominent        Past Medical History  Diagnosis Date  . Hypertension   . Gout   . Obesity   . Hyperlipidemia   . Diabetes mellitus     type II  . Paroxysmal atrial fibrillation (HCC)     postoperatively  . DIABETES MELLITUS,  TYPE II 07/27/2007  . GOUT 12/01/2006  . HYPERLIPIDEMIA 01/19/2007  . HYPERTENSION 12/01/2006  . 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 OBESITY, HX OF 12/01/2006  . Cataract   . Hx of colonic polyps 05/30/2008    Past Surgical History  Procedure Laterality Date  . Cholecystectomy      Laparoscopic  . Cataract extraction      bilateral  . Colonoscopy    . Tee without cardioversion N/A 08/20/2014    Procedure: TRANSESOPHAGEAL ECHOCARDIOGRAM (TEE);  Surgeon: Sueanne Margarita, MD;  Location: [facility] ENDOSCOPY;  Service: Cardiovascular;  Laterality: N/A;  PT NEED A LOOP      Medication List       This list is accurate as of: 04/09/15 11:59 PM.  Always use your most recent med list.               allopurinol 100 MG tablet  Commonly known as:  ZYLOPRIM  Take 1 tablet (100 mg total) by mouth daily.     amLODipine 2.5 MG tablet  Commonly known as:  NORVASC  Take 2.5 mg by mouth daily.     aspirin 325 MG tablet  Take 1 tablet (325 mg total) by mouth daily.     atorvastatin 80 MG tablet  Commonly known as:  LIPITOR  Take 1 tablet (80 mg total) by mouth daily.     hydrochlorothiazide 12.5 MG capsule  Commonly known as:  MICROZIDE  Take 12.5 mg by mouth daily.     insulin aspart 100 UNIT/ML injection  Commonly known as:  novoLOG  Three times a day prior to meals based on following sliding scale. CBG < 70: implement hypoglycemia protocol CBG 70 - 120: 0 units CBG 121 - 150: 2 units CBG 151 - 200: 3 units CBG 201 - 250: 5 units CBG 251 - 300: 8 units and call MD     insulin glargine 100 UNIT/ML injection  Commonly known as:  LANTUS  Inject 0.1 mLs (10 Units total) into the skin at bedtime.     lisinopril 20 MG tablet  Commonly known as:  PRINIVIL,ZESTRIL  Take 1 tablet (20 mg total) by mouth daily.     magnesium oxide 400 (241.3 Mg) MG tablet  Commonly known as:  MAG-OX  Take 1 tablet (400 mg total) by mouth 2 (two) times daily.     metoprolol 50 MG tablet  Commonly known as:   LOPRESSOR  Take 1 tablet (50 mg total) by mouth 2 (two) times daily with a meal.     potassium chloride SA 20 MEQ tablet  Commonly known as:  K-DUR,KLOR-CON  Take 20 mEq by mouth 2 (two) times daily.     traMADol 50 MG tablet  Commonly known as:  ULTRAM  Take one tablet by mouth twice daily for pain. Hold for sedation       of note his hydrochlorothiazide 12.5 mg a day has been restarted His Norvasc has been increased to 5 mg a day  Immunization History  Administered Date(s) Administered  . Influenza Split 02/25/2011  . Influenza Whole 03/07/1997, 01/19/2007, 01/24/2008, 01/19/2009, 01/19/2010  . Pneumococcal Conjugate-13 12/04/2013  . Pneumococcal Polysaccharide-23 01/24/2008  . Td 03/07/1993, 01/24/2008  . Zoster 01/19/2010    Social History  Substance Use Topics  . Smoking status: Current Every Day Smoker    Types: Pipe  . Smokeless tobacco: Never Used     Comment: occasional  . Alcohol Use: 0.0 oz/week    0 Standard drinks or equivalent per week     Comment: 2 drinks a day-beer    Family history is noncontributory    Review of Systems  DATA OBTAINED: from nurse, medical record, patient GENERAL:  no fevers, fatigue, appetite changes SKIN: No itching, rash or wounds EYES: No eye pain, redness, discharge EARS: No earache, tinnitus, change in hearing NOSE: No congestion, drainage or bleeding  MOUTH/THROAT: No mouth or tooth pain, No sore throat RESPIRATORY: No cough, wheezing, SOB CARDIAC: No chest pain, palpitations, lower extremity edema  GI: No abdominal pain, No N/V/D or constipation, No heartburn or reflux  GU: No dysuria, frequency or urgency, or incontinence  MUSCULOSKELETAL:  At times will complain of some left groin pain as noted above this is more with movement NEUROLOGIC: He has gained strength had initially some left-sided weakness still has some mild weakness but is doing significantly better here has worked with physical therapy PSYCHIATRIC:  No  behavior issue.     Physical Exam  Temperature 96.5 pulse 56 respirations 20 blood pressure taken manually 130/80 GENERAL APPEARANCE: Pleasant male in no distress lying comfortably in bed  SKIN: No diaphoresis rash Possible   small lump type area in the right thorax -- this is fairly minimal and somewhat difficult to palpate today HEAD: Normocephalic, atraumatic  EYES: Conjunctiva/lids clear. Pupils round, reactive. EOMs intact.  EARS: External exam WNL, canals clear. Hearing grossly normal.  NOSE: No deformity or discharge.  MOUTH/THROAT: Oropharynx is clear mucous membranes moist RESPIRATORY: Breathing is even, unlabored. Lung sounds are clear   CARDIOVASCULAR: Heart RRR slightlybradycardic   no murmurs, rubs or gallops. No peripheral edema.   GASTROINTESTINAL: Abdomen is soft, non-tender, not distended w/ normal bowel sounds.   MUSCULOSKELETAL: There is no significant  tenderness to palpation of the left groin area he says has pain at times when he has movement I do not see any deformity lump firmness erythema or warmth here --does have a history of mild left-sided weakness he is able to move all extremities 4 lift his arm grip strength appears to be intact bilaterally also able to lift legs bilaterally  NEUROLOGIC:  R side facial weakness, L side body weakness which appears to be somewhat gradually improving  PSYCHIATRIC:  no behavioral issues  Patient Active Problem List   Diagnosis Date Noted  . Diabetes mellitus type 2, controlled, with complications ([place]) 123XX123  . Left groin pain 04/09/2015  . Pain in joint, lower leg 11/13/2014  . Hypokalemia 08/22/2014  . Alcohol use ([place]) 08/22/2014  . NSVT (nonsustained ventricular tachycardia) ([place])   . Acute ischemic stroke ([place]) 08/18/2014  . Increased prostate specific antigen (PSA) velocity 02/25/2011  . Preventative health care 07/18/2010  . Atrial fibrillation ([place]) 03/09/2010  . 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  Hx of colonic polyps 05/30/2008  . Diabetes mellitus type 2, uncontrolled, with complications ([place]) 123456  . Hyperlipidemia 01/19/2007  . GOUT 12/01/2006  . Essential hypertension 12/01/2006  . OBESITY, HX OF 12/01/2006    Labs  02/23/2015.  Uric acid 4.1.  02/20/2015.  Sodium 141 potassium 3.6 BUN 13 creatinine 0.9-.  Liver function tests within normal limits.  Hemoglobin A1c 6.6.  Magnesium 1.7.  WBC 7.3 hemoglobin 13.9 platelets 229  01/01/2015.  Sodium 141 potassium 3.6 BUN 17 creatinine 0.8.  HDL 26-LDL 62-cholesterol 117-.  Magnesium 1.7.  WBC 9.7 hemoglobin 15.1 platelets 249  12/06/2014.  Sodium 140 potassium 3.9 BUN 13 creatinine 0.86.  11/27/2014.  Sodium 141 potassium 3.2 BUN 13 creatinine 0.8.  11/14/2014.  WBC 9.3 hemoglobin 13.7 platelets 232.  Liver function tests within normal limits.  Hemoglobin A1c 7.2.  .  08/25/2014.  Sodium 139 potassium 3.6 BUN 24 creatinine 1.0.  08/22/2014.  WBC 9.4 hemoglobin 15.4 platelets 232.    CBC    Component Value Date/Time   WBC 9.4 08/22/2014 1423   WBC 9.7 08/20/2014 0620   RBC 5.02 08/20/2014 0620   HGB 15.4 08/22/2014 1423   HCT 47 08/22/2014 1423   PLT 232 08/22/2014 1423   MCV 87.6 08/20/2014 0620   LYMPHSABS 1.6 08/18/2014 2026   MONOABS 0.5 08/18/2014 2026   EOSABS 0.0 08/18/2014 2026   BASOSABS 0.0 08/18/2014 2026    CMP     Component Value Date/Time   NA 136* 08/22/2014   NA 135 08/21/2014 0623   K 3.6 08/22/2014 1423   CL 101 08/21/2014 0623   CO2 21* 08/21/2014 0623   GLUCOSE 139* 08/21/2014 0623   BUN 19 08/22/2014 1423   BUN 9 08/21/2014 0623   CREATININE 1.0 08/22/2014 1423   CREATININE 0.90 08/21/2014 0623   CALCIUM 8.8* 08/21/2014 0623   PROT 7.1 08/18/2014 2026   ALBUMIN 3.4* 08/18/2014 2026   AST 19 08/18/2014 2026   ALT 13* 08/18/2014 2026   ALKPHOS 75 08/18/2014 2026   BILITOT 0.9 08/18/2014 2026   GFRNONAA >60 08/21/2014 0623   GFRAA >60  08/21/2014 0623    Assessment and Plan   1 history of acute ischemic stroke-continues on aspirin for anticoagulation-continues also on a statin. This appears to be stable  Carotid Doppler showed bilateral   1-39 percent ICA stenosis-he appears to be improving which is encouraging his speech has improved strength has improved on the left side as well.  History of atrial fibrillation continues on aspirin-this appears to be rate controlled he is on Lopressor as well for rate control.--He does have pulses in the 50s at times but he is asymptomatic blood pressures have been stable at this point will monitor if he shows any signs of symptomatic bradycardia hypotension dizziness certainly will reconsider but this does not appear to be the case--ordered to hold Lopressor for pulse less than 60  #3 diabetes type 2-he continues on Lantus insulin 10 units daily at bedtime as well as NovoLog sliding scale Blood sugars appear to have stabilized largely in the lower mid 100s hemoglobin A1c shows improvement 6.6 on lab done December 2016   #4-history of hypertension this appears to be somewhat variable he is back on hydrochlorothiazide 12.5 mg a day this was added by Dr. Sheppard Coil previously We have titrated his lisinopril up to 40 mg a day-he is also on Lopressor 50 mg twice a day and Norvasc 5mg  a day-his blood pressure s appear to be improving I got 130/80 manually--I see listed systolics ranging from 123XX123 to 150 although readings above 140 do not appear to be prevelant   #5-hypokalemia-apparently this was aggressively repleted in the hospital because of presence of PVCs and nonsustained  ventricular tachycardia in the hospital--this has been stable on supplementation Will update a metabolic panel    Was thoughtt hypokalemia borderline lowmagnesium was contributing to this.--He is on magnesium supplementation and Magnesium  level on lab in December was 1.7 he is on supplementation Will update a  level    #6 history of alcohol abuse this has not been an issue during his stay here he is on folic acid  [id number] of right knee pain- He says the tramadol is helping his continue to monitor.  #8 history of left groin pain I would agree with patient that this is probably a small muscle strain Exam was quite benign monitor for resolution here     #9 hyperlipidemia he is on a statin  --LDL at goal at 62 per lab on 01/01/2015- this appears iver function tests within normal limits in December 2016--he is on a statin  #9-history of gout he is on allopurinol uric acid in December 2016 was within normal limits  #10 history of right lateral thorax abnormality-this appears less prominent today as noted above will check with staff on status of ? scan  At this point monitor for any changes.  [id number] note greater than 35 minutes spent assessing patient-discussing his concerns at bedside--reviewing his chart-and coordinating and formulating a plan of care for numerous diagnoses-of note greater than 50% of time spent coordinating plan of care         .Marland Kitchen    Melainie Krinsky C,

## 2015-04-10 DIAGNOSIS — R001 Bradycardia, unspecified: Secondary | ICD-10-CM | POA: Diagnosis not present

## 2015-04-17 ENCOUNTER — Other Ambulatory Visit: Payer: Self-pay | Admitting: Internal Medicine

## 2015-04-17 DIAGNOSIS — R918 Other nonspecific abnormal finding of lung field: Secondary | ICD-10-CM

## 2015-04-20 ENCOUNTER — Encounter: Payer: Self-pay | Admitting: Internal Medicine

## 2015-04-20 NOTE — Progress Notes (Signed)
This encounter was created in error - please disregard.

## 2015-04-21 ENCOUNTER — Encounter (HOSPITAL_COMMUNITY): Payer: Self-pay

## 2015-04-21 ENCOUNTER — Ambulatory Visit (HOSPITAL_COMMUNITY)
Admission: RE | Admit: 2015-04-21 | Discharge: 2015-04-21 | Disposition: A | Discharge status: Home / Self Care | Payer: Medicare Other | Source: Ambulatory Visit | Attending: Internal Medicine | Admitting: Internal Medicine

## 2015-04-21 DIAGNOSIS — R918 Other nonspecific abnormal finding of lung field: Secondary | ICD-10-CM | POA: Diagnosis not present

## 2015-04-21 DIAGNOSIS — R222 Localized swelling, mass and lump, trunk: Secondary | ICD-10-CM | POA: Diagnosis not present

## 2015-04-21 MED ORDER — IOHEXOL 300 MG/ML  SOLN
75.0000 mL | Freq: Once | INTRAMUSCULAR | Status: AC | PRN
  Administered 2015-04-21: 75 mL via INTRAVENOUS

## 2015-04-23 DIAGNOSIS — F329 Major depressive disorder, single episode, unspecified: Secondary | ICD-10-CM | POA: Diagnosis not present

## 2015-04-24 ENCOUNTER — Non-Acute Institutional Stay (SKILLED_NURSING_FACILITY): Payer: Medicare Other | Admitting: Internal Medicine

## 2015-04-24 DIAGNOSIS — I639 Cerebral infarction, unspecified: Secondary | ICD-10-CM | POA: Diagnosis not present

## 2015-04-24 DIAGNOSIS — I10 Essential (primary) hypertension: Secondary | ICD-10-CM | POA: Diagnosis not present

## 2015-04-24 DIAGNOSIS — I482 Chronic atrial fibrillation, unspecified: Secondary | ICD-10-CM

## 2015-04-24 LAB — HM DIABETES FOOT EXAM

## 2015-04-24 NOTE — Progress Notes (Signed)
+MRN: [id number] Name: Juan [name]  Sex: male Age: 65 y.o. DOB: 1950-11-17  [place] #: [name] Facility/Room: Level Of Care: SNF Provider: Inocencio Homes D Emergency Contacts: Extended Emergency Contact Information Primary Emergency Contact: Brining,Margie A. Address: [place], [place] Phone: [phone] Work Phone: [phone] Relation: Spouse Secondary Emergency Contact: Real Cons [place] of [place] Phone: [phone] Relation: Son  Code Status:   Allergies: Review of patient's allergies indicates no known allergies.  Chief Complaint  Patient presents with  . Medical Management of Chronic Issues    HPI: Patient is 65 y.o. male who is being seen for routine issues of HTN, AF and s/p CVA. Pt did admit that he misses his wife and he doesn't think she is going to make it in assisted living.  Past Medical History  Diagnosis Date  . Hypertension   . Gout   . Obesity   . Hyperlipidemia   . Diabetes mellitus     type II  . Paroxysmal atrial fibrillation (HCC)     postoperatively  . DIABETES MELLITUS, TYPE II 07/27/2007  . GOUT 12/01/2006  . HYPERLIPIDEMIA 01/19/2007  . HYPERTENSION 12/01/2006  . 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 OBESITY, HX OF 12/01/2006  . Cataract   . Hx of colonic polyps 05/30/2008    Past Surgical History  Procedure Laterality Date  . Cholecystectomy      Laparoscopic  . Cataract extraction      bilateral  . Colonoscopy    . Tee without cardioversion N/A 08/20/2014    Procedure: TRANSESOPHAGEAL ECHOCARDIOGRAM (TEE);  Surgeon: Sueanne Margarita, MD;  Location: [facility] ENDOSCOPY;  Service: Cardiovascular;  Laterality: N/A;  PT NEED A LOOP      Medication List       This list is accurate as of: 04/24/15 11:59 PM.  Always use your most recent med list.               allopurinol 100 MG tablet  Commonly known as:  ZYLOPRIM  Take 1 tablet (100 mg total) by mouth  daily.     amLODipine 2.5 MG tablet  Commonly known as:  NORVASC  Take 5 mg by mouth daily.     aspirin 325 MG tablet  Take 1 tablet (325 mg total) by mouth daily.     atorvastatin 80 MG tablet  Commonly known as:  LIPITOR  Take 1 tablet (80 mg total) by mouth daily.     hydrochlorothiazide 12.5 MG capsule  Commonly known as:  MICROZIDE  Take 12.5 mg by mouth daily.     insulin aspart 100 UNIT/ML injection  Commonly known as:  novoLOG  Three times a day prior to meals based on following sliding scale. CBG < 70: implement hypoglycemia protocol CBG 70 - 120: 0 units CBG 121 - 150: 2 units CBG 151 - 200: 3 units CBG 201 - 250: 5 units CBG 251 - 300: 8 units and call MD     insulin glargine 100 UNIT/ML injection  Commonly known as:  LANTUS  Inject 0.1 mLs (10 Units total) into the skin at bedtime.     lisinopril 20 MG tablet  Commonly known as:  PRINIVIL,ZESTRIL  Take 1 tablet (20 mg total) by mouth daily.     magnesium oxide 400 (241.3 Mg) MG tablet  Commonly known as:  MAG-OX  Take 1 tablet (400 mg total) by mouth 2 (two)  times daily.     metoprolol 50 MG tablet  Commonly known as:  LOPRESSOR  Take 1 tablet (50 mg total) by mouth 2 (two) times daily with a meal.     potassium chloride SA 20 MEQ tablet  Commonly known as:  K-DUR,KLOR-CON  Take 20 mEq by mouth 2 (two) times daily.     traMADol 50 MG tablet  Commonly known as:  ULTRAM  Take one tablet by mouth twice daily for pain. Hold for sedation        No orders of the defined types were placed in this encounter.    Immunization History  Administered Date(s) Administered  . Influenza Split 02/25/2011  . Influenza Whole 03/07/1997, 01/19/2007, 01/24/2008, 01/19/2009, 01/19/2010  . Pneumococcal Conjugate-13 12/04/2013  . Pneumococcal Polysaccharide-23 01/24/2008  . Td 03/07/1993, 01/24/2008  . Zoster 01/19/2010    Social History  Substance Use Topics  . Smoking status: Current Every Day Smoker    Types:  Pipe  . Smokeless tobacco: Never Used     Comment: occasional  . Alcohol Use: 0.0 oz/week    0 Standard drinks or equivalent per week     Comment: 2 drinks a day-beer    Review of Systems  DATA OBTAINED: from patient GENERAL:  no fevers, fatigue, appetite changes SKIN: No itching, rash HEENT: No complaint RESPIRATORY: No cough, wheezing, SOB CARDIAC: No chest pain, palpitations, lower extremity edema  GI: No abdominal pain, No N/V/D or constipation, No heartburn or reflux  GU: No dysuria, frequency or urgency, or incontinence  MUSCULOSKELETAL: No unrelieved bone/joint pain NEUROLOGIC: No headache, dizziness  SYCHIATRIC: No overt anxiety or sadness  Filed Vitals:   04/25/15 1322  BP: 156/89  Pulse: 52  Temp: 97.7 F (36.5 C)  Resp: 18    Physical Exam  GENERAL APPEARANCE: Alert, conversant, No acute distress  SKIN: No diaphoresis rash HEENT: Unremarkable RESPIRATORY: Breathing is even, unlabored. Lung sounds are clear   CARDIOVASCULAR: Heart RRR no murmurs, rubs or gallops. No peripheral edema  GASTROINTESTINAL: Abdomen is soft, non-tender, not distended w/ normal bowel sounds.  GENITOURINARY: Bladder non tender, not distended  MUSCULOSKELETAL: No abnormal joints or musculature NEUROLOGIC: Cranial nerves 2-12 grossly intact.L side weakness PSYCHIATRIC: Mood and affect appropriate to situation, no behavioral issues  Patient Active Problem List   Diagnosis Date Noted  . Diabetes mellitus type 2, controlled, with complications ([place]) 123XX123  . Left groin pain 04/09/2015  . Pain in joint, lower leg 11/13/2014  . Hypokalemia 08/22/2014  . Alcohol use ([place]) 08/22/2014  . NSVT (nonsustained ventricular tachycardia) ([place])   . Acute ischemic stroke ([place]) 08/18/2014  . Increased prostate specific antigen (PSA) velocity 02/25/2011  . Preventative health care 07/18/2010  . Atrial fibrillation ([place]) 03/09/2010  . 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 Hx of colonic  polyps 05/30/2008  . Diabetes mellitus type 2, uncontrolled, with complications ([place]) 123456  . Hyperlipidemia 01/19/2007  . GOUT 12/01/2006  . Essential hypertension 12/01/2006  . OBESITY, HX OF 12/01/2006    CBC    Component Value Date/Time   WBC 9.4 08/22/2014 1423   WBC 9.7 08/20/2014 0620   RBC 5.02 08/20/2014 0620   HGB 15.4 08/22/2014 1423   HCT 47 08/22/2014 1423   PLT 232 08/22/2014 1423   MCV 87.6 08/20/2014 0620   LYMPHSABS 1.6 08/18/2014 2026   MONOABS 0.5 08/18/2014 2026   EOSABS 0.0 08/18/2014 2026   BASOSABS 0.0 08/18/2014 2026    CMP     Component Value  Date/Time   NA 136* 08/22/2014   NA 135 08/21/2014 0623   K 3.6 08/22/2014 1423   CL 101 08/21/2014 0623   CO2 21* 08/21/2014 0623   GLUCOSE 139* 08/21/2014 0623   BUN 19 08/22/2014 1423   BUN 9 08/21/2014 0623   CREATININE 1.0 08/22/2014 1423   CREATININE 0.90 08/21/2014 0623   CALCIUM 8.8* 08/21/2014 0623   PROT 7.1 08/18/2014 2026   ALBUMIN 3.4* 08/18/2014 2026   AST 19 08/18/2014 2026   ALT 13* 08/18/2014 2026   ALKPHOS 75 08/18/2014 2026   BILITOT 0.9 08/18/2014 2026   GFRNONAA >60 08/21/2014 0623   GFRAA >60 08/21/2014 0623    Assessment and Plan  Essential hypertension Not well controlled on metoprolol 50 BID, lisinopril 40 mg, HCTZ 12.5 and norvasc 5 mg; will inc norvasc to 10 mgdaily  Atrial fibrillation Chronic and stable;cont metoprolol BID and ASA 325 as prophylaxis  Acute ischemic stroke Pt now speaks very well, with L side weakness, he prefers to remain in bed; pt on ASA as prophylaxis and on statin    Hennie Duos, MD

## 2015-04-25 ENCOUNTER — Encounter: Payer: Self-pay | Admitting: Internal Medicine

## 2015-04-25 NOTE — Assessment & Plan Note (Signed)
Pt now speaks very well, with L side weakness, he prefers to remain in bed; pt on ASA as prophylaxis and on statin

## 2015-04-25 NOTE — Assessment & Plan Note (Signed)
Not well controlled on metoprolol 50 BID, lisinopril 40 mg, HCTZ 12.5 and norvasc 5 mg; will inc norvasc to 10 mgdaily

## 2015-04-25 NOTE — Assessment & Plan Note (Signed)
Chronic and stable;cont metoprolol BID and ASA 325 as prophylaxis

## 2015-04-27 ENCOUNTER — Encounter: Payer: Self-pay | Admitting: Internal Medicine

## 2015-05-05 DIAGNOSIS — Z794 Long term (current) use of insulin: Secondary | ICD-10-CM | POA: Diagnosis not present

## 2015-05-05 DIAGNOSIS — E119 Type 2 diabetes mellitus without complications: Secondary | ICD-10-CM | POA: Diagnosis not present

## 2015-05-05 DIAGNOSIS — Z961 Presence of intraocular lens: Secondary | ICD-10-CM | POA: Diagnosis not present

## 2015-05-05 LAB — HM DIABETES EYE EXAM

## 2015-05-28 DIAGNOSIS — M79671 Pain in right foot: Secondary | ICD-10-CM | POA: Diagnosis not present

## 2015-05-28 DIAGNOSIS — B351 Tinea unguium: Secondary | ICD-10-CM | POA: Diagnosis not present

## 2015-05-28 DIAGNOSIS — M79672 Pain in left foot: Secondary | ICD-10-CM | POA: Diagnosis not present

## 2015-05-28 DIAGNOSIS — E119 Type 2 diabetes mellitus without complications: Secondary | ICD-10-CM | POA: Diagnosis not present

## 2015-06-11 ENCOUNTER — Encounter: Payer: Self-pay | Admitting: Internal Medicine

## 2015-06-11 ENCOUNTER — Non-Acute Institutional Stay (SKILLED_NURSING_FACILITY): Payer: Medicare Other | Admitting: Internal Medicine

## 2015-06-11 DIAGNOSIS — I10 Essential (primary) hypertension: Secondary | ICD-10-CM

## 2015-06-11 DIAGNOSIS — Z8673 Personal history of transient ischemic attack (TIA), and cerebral infarction without residual deficits: Secondary | ICD-10-CM | POA: Diagnosis not present

## 2015-06-11 DIAGNOSIS — E876 Hypokalemia: Secondary | ICD-10-CM

## 2015-06-11 DIAGNOSIS — E118 Type 2 diabetes mellitus with unspecified complications: Secondary | ICD-10-CM

## 2015-06-11 DIAGNOSIS — I4891 Unspecified atrial fibrillation: Secondary | ICD-10-CM | POA: Diagnosis not present

## 2015-06-11 NOTE — Progress Notes (Signed)
Patient ID: [name], male   DOB: 06-13-1950, 65 y.o.   MRN: [id number]     MRN: [id number] Name: Juan [name]  Sex: male Age: 65 y.o. DOB: 1950-04-14  [place] #: [name] Facility/Room:100 Level Of Care: SNF Provider: Wille Celeste Emergency Contacts: Extended Emergency Contact Information Primary Emergency Contact: Ericson,Margie A. Address: [place], [place] Phone: [phone] Work Phone: [phone] Relation: Spouse Secondary Emergency Contact: Real Cons [place] of [place] Phone: [phone] Relation: Son  Code Status:   Allergies: Review of patient's allergies indicates no known allergies.  Chief Complaint  Patient presents with  . Medical Management of Chronic Issues     Medical management of chronic medical issues including history of CVA diabetes type 2 hypertension depression hyperlipidemia--osteoarthritis  HPI: Patient is 65 y.o. male who who is admitted to SNF for OT/PT after being admitted to hospital for acute CVA with L side weakness and uncontrolled DM.--This appeared to have stabilized he has gained strength in his speech has improved-   An issue recently was elevated blood pressure Dr. Sheppard Coil did start him on low dose hydrochlorothiazide which apparently he had been on previously Blood pressures continued to be somewhat elevated and his lisinopril has been increased to 40 mg a day-and recently NorvascWas started and gradually increased I see recommendation recently by Dr. Sheppard Coil to increase this to 10 mg a day He is also on Lopressor 50 mg twice a day with a history of atrial fibrillation   Blood pressures continue to be somewhat variable most recently was 119/75-136/70-130/78 I got 150/80 male although patient was a little upset talking about his wife who recently moved to another facility and this may have increased his a systolic blood pressure.  Other than  missing his wife patient does not really have any acute complaints today.  He is on Lantus insulin 10 units daily with NovoLog sliding scale this appears well controlled with CBGs largely in the lower to mid 100s.  I do note he continues to have bradycardic readings often in the 50s with Lopressor I do see some listings in the 40s I got 56 on exam today-suspect we will try to go down a bit on the Lopressor and monitor his blood pressures closely.  He does not appear to symptomatic any syncope weakness dizziness t      Also back in September showed a potassium of 3.2 he is now on potassium supplementation-his potassium has normalized per most recent lab 3.6 on 02/23/2015  When I originally saw him in September he did complain of some leg spasms initially in the morning when he got out of bed- He also complaining of some right knee pain as well-he says this is improved .  X-rays did show osteoarthritis but no acute process-venous Doppler as well was negative.  We have made his tramadol routinely twice a day and apparently this is helping some-he also has tramadol as needed every 6 hours     Patient also has had a "lump"-the right lower thorax area-x-ray of this did not show any acute process--per patient this lump has become less prominent is not painful he feels that has moved actually somewhat--Dr. Sheppard Coil has followed up on this-apparently a scan has been ordered 78 did review the CT scan done back in February and actually did not show any acute process it showed some  prominence of the costochondral  junction which I suspect is what we are palpating        Past Medical History  Diagnosis Date  . Hypertension   . Gout   . Obesity   . Hyperlipidemia   . Diabetes mellitus     type II  . Paroxysmal atrial fibrillation (HCC)     postoperatively  . DIABETES MELLITUS, TYPE II 07/27/2007  . GOUT 12/01/2006  . HYPERLIPIDEMIA 01/19/2007  . HYPERTENSION 12/01/2006  . POSTURAL  LIGHTHEADEDNESS 02/11/2010  . OBESITY, HX OF 12/01/2006  . Cataract   . Hx of colonic polyps 05/30/2008    Past Surgical History  Procedure Laterality Date  . Cholecystectomy      Laparoscopic  . Cataract extraction      bilateral  . Colonoscopy    . Tee without cardioversion N/A 08/20/2014    Procedure: TRANSESOPHAGEAL ECHOCARDIOGRAM (TEE);  Surgeon: Sueanne Margarita, MD;  Location: [facility];  Service: Cardiovascular;  Laterality: N/A;  PT NEED A LOOP      Medication List       This list is accurate as of: 06/11/15 11:59 PM.  Always use your most recent med list.               allopurinol 100 MG tablet  Commonly known as:  ZYLOPRIM  Take 1 tablet (100 mg total) by mouth daily.     amLODipine 2.5 MG tablet  Commonly known as:  NORVASC  Take 5 mg by mouth daily.     aspirin 325 MG tablet  Take 1 tablet (325 mg total) by mouth daily.     atorvastatin 80 MG tablet  Commonly known as:  LIPITOR  Take 1 tablet (80 mg total) by mouth daily.     hydrochlorothiazide 12.5 MG capsule  Commonly known as:  MICROZIDE  Take 12.5 mg by mouth daily.     insulin aspart 100 UNIT/ML injection  Commonly known as:  novoLOG  Three times a day prior to meals based on following sliding scale. CBG < 70: implement hypoglycemia protocol CBG 70 - 120: 0 units CBG 121 - 150: 2 units CBG 151 - 200: 3 units CBG 201 - 250: 5 units CBG 251 - 300: 8 units and call MD     insulin glargine 100 UNIT/ML injection  Commonly known as:  LANTUS  Inject 0.1 mLs (10 Units total) into the skin at bedtime.     lisinopril 20 MG tablet  Commonly known as:  PRINIVIL,ZESTRIL  Take 1 tablet (20 mg total) by mouth daily.     magnesium oxide 400 (241.3 Mg) MG tablet  Commonly known as:  MAG-OX  Take 1 tablet (400 mg total) by mouth 2 (two) times daily.     metoprolol 50 MG tablet  Commonly known as:  LOPRESSOR  Take 1 tablet (50 mg total) by mouth 2 (two) times daily with a meal.     potassium chloride SA 20  MEQ tablet  Commonly known as:  K-DUR,KLOR-CON  Take 20 mEq by mouth 2 (two) times daily.     traMADol 50 MG tablet  Commonly known as:  ULTRAM  Take one tablet by mouth twice daily for pain. Hold for sedation       of note his hydrochlorothiazide 12.5 mg a day has been restarted  Norvasc is also been increased per Dr. Redgie Grayer recommendation to 10 mg a day His Norvasc has been increased to 5 mg a day    Immunization History  Administered Date(s)  Administered  . Influenza Split 02/25/2011  . Influenza Whole 03/07/1997, 01/19/2007, 01/24/2008, 01/19/2009, 01/19/2010  . Pneumococcal Conjugate-13 12/04/2013  . Pneumococcal Polysaccharide-23 01/24/2008  . Td 03/07/1993, 01/24/2008  . Zoster 01/19/2010    Social History  Substance Use Topics  . Smoking status: Current Every Day Smoker    Types: Pipe  . Smokeless tobacco: Never Used     Comment: occasional  . Alcohol Use: 0.0 oz/week    0 Standard drinks or equivalent per week     Comment: 2 drinks a day-beer    Family history is noncontributory    Review of Systems  DATA OBTAINED: from nurse, medical record, patient GENERAL:  no fevers, fatigue, appetite changes SKIN: No itching, rash or wounds EYES: No eye pain, redness, discharge EARS: No earache, tinnitus, change in hearing NOSE: No congestion, drainage or bleeding  MOUTH/THROAT: No mouth or tooth pain, No sore throat RESPIRATORY: No cough, wheezing, SOB CARDIAC: No chest pain, palpitations, lower extremity edema  GI: No abdominal pain, No N/V/D or constipation, No heartburn or reflux  GU: No dysuria, frequency or urgency, or incontinence  MUSCULOSKELETAL:  Joint pain as noted above appears to be better controlled NEUROLOGIC: He has gained strength had initially some left-sided weakness still has some mild weakness but is doing significantly better here has worked with physical therapy PSYCHIATRIC:  No behavior issue. Says he misses his wife who is now on in  another facility    Physical Exam  He is afebrile pulse of 56 respirations 18 blood pressure taken manually 150/80 recent other readings 119/75-136/72 GENERAL APPEARANCE: Pleasant male in no distress lying comfortably in bed  SKIN: No diaphoresis rash Possible   small lump type area in the right thorax -- this is fairly minimal and somewhat difficult to palpate t which was the case last time I examined him him as well  Distal aspect of right great toe there is a small open area that is followed by wound care I do not see any sign of infection there is a well demarcated wound bed no drainage bleeding or odor or surrounding erythema HEAD: Normocephalic, atraumatic  EYES: Conjunctiva/lids clear. Pupils round, reactive. EOMs intact.  EARS: External exam WNL, canals clear. Hearing grossly normal.  NOSE: No deformity or discharge.  MOUTH/THROAT: Oropharynx is clear mucous membranes moist RESPIRATORY: Breathing is even, unlabored. Lung sounds are clear   CARDIOVASCULAR: Heart RRR slightlybradycardic   no murmurs, rubs or gallops. No peripheral edema.   GASTROINTESTINAL: Abdomen is soft, non-tender, not distended w/ normal bowel sounds.   MUSCULOSKELETAL:   --does have a history of mild left-sided weakness he is able to move all extremities 4 lift his arm grip strength appears to be intact bilaterally also able to lift legs bilaterally  NEUROLOGIC:  R side facial weakness, L side body weakness which continues it appears to improve PSYCHIATRIC:  no behavioral issues  Patient Active Problem List   Diagnosis Date Noted  . History of CVA (cerebrovascular accident) 06/11/2015  . Diabetes mellitus type 2, controlled, with complications ([place]) 123XX123  . Left groin pain 04/09/2015  . Pain in joint, lower leg 11/13/2014  . Hypokalemia 08/22/2014  . Alcohol use ([place]) 08/22/2014  . NSVT (nonsustained ventricular tachycardia) ([place])   . Acute ischemic stroke ([place]) 08/18/2014  . Increased  prostate specific antigen (PSA) velocity 02/25/2011  . Preventative health care 07/18/2010  . Atrial fibrillation ([place]) 03/09/2010  . 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 Hx of colonic polyps 05/30/2008  .  Diabetes mellitus type 2, uncontrolled, with complications ([place]) 123456  . Hyperlipidemia 01/19/2007  . GOUT 12/01/2006  . Essential hypertension 12/01/2006  . OBESITY, HX OF 12/01/2006    Labs  04/10/2015.  Magnesium 1.7.  Sodium 142 potassium 3.7 BUN 14 creatinine 0.8.    02/23/2015.  Uric acid 4.1.  02/20/2015.  Sodium 141 potassium 3.6 BUN 13 creatinine 0.9-.  Liver function tests within normal limits.  Hemoglobin A1c 6.6.  Magnesium 1.7.  WBC 7.3 hemoglobin 13.9 platelets 229  01/01/2015.  Sodium 141 potassium 3.6 BUN 17 creatinine 0.8.  HDL 26-LDL 62-cholesterol 117-.  Magnesium 1.7.  WBC 9.7 hemoglobin 15.1 platelets 249  12/06/2014.  Sodium 140 potassium 3.9 BUN 13 creatinine 0.86.  11/27/2014.  Sodium 141 potassium 3.2 BUN 13 creatinine 0.8.  11/14/2014.  WBC 9.3 hemoglobin 13.7 platelets 232.  Liver function tests within normal limits.  Hemoglobin A1c 7.2.  .  08/25/2014.  Sodium 139 potassium 3.6 BUN 24 creatinine 1.0.  08/22/2014.  WBC 9.4 hemoglobin 15.4 platelets 232.    CBC    Component Value Date/Time   WBC 9.4 08/22/2014 1423   WBC 9.7 08/20/2014 0620   RBC 5.02 08/20/2014 0620   HGB 15.4 08/22/2014 1423   HCT 47 08/22/2014 1423   PLT 232 08/22/2014 1423   MCV 87.6 08/20/2014 0620   LYMPHSABS 1.6 08/18/2014 2026   MONOABS 0.5 08/18/2014 2026   EOSABS 0.0 08/18/2014 2026   BASOSABS 0.0 08/18/2014 2026    CMP     Component Value Date/Time   NA 136* 08/22/2014   NA 135 08/21/2014 0623   K 3.6 08/22/2014 1423   CL 101 08/21/2014 0623   CO2 21* 08/21/2014 0623   GLUCOSE 139* 08/21/2014 0623   BUN 19 08/22/2014 1423   BUN 9 08/21/2014 0623   CREATININE 1.0 08/22/2014 1423   CREATININE 0.90  08/21/2014 0623   CALCIUM 8.8* 08/21/2014 0623   PROT 7.1 08/18/2014 2026   ALBUMIN 3.4* 08/18/2014 2026   AST 19 08/18/2014 2026   ALT 13* 08/18/2014 2026   ALKPHOS 75 08/18/2014 2026   BILITOT 0.9 08/18/2014 2026   GFRNONAA >60 08/21/2014 0623   GFRAA >60 08/21/2014 0623    Assessment and Plan   1 history of acute ischemic stroke-continues on aspirin for anticoagulation-continues also on a statin. This appears to be stable  Carotid Doppler showed bilateral   1-39 percent ICA stenosis-he appears to be improving which is encouraging his speech has improved strength has improved on the left side as well.He continues to do quite well and this regard  #2 History of atrial fibrillation continues on aspirin-this appears to be rate controlled he is on Lopressor as well for rate control.--He does have pulses in the 50s at times and I note occasional 40s as well although this is not real frequent- Lopressor is held quite a bit secondary  To  orders to hold for pulse less than 60-will decrease his Lopressor to 25 mg twice a day I suspect he may be getting it actually more this way-- and pulse will be a bit better controlled-again he does not appear symptomatic of any bradycardia but this will have to be watched also his blood pressures will have to be monitored closely with a log inprovider book for review   #3 diabetes type 2-he continues on Lantus insulin 10 units daily at bedtime as well as NovoLog sliding scale Blood sugars appear to have stabilized largely in the lower mid 100s hemoglobin A1c  shows improvement 6.6 on lab done December 2016 will update hemoglobin A1c   #4-history of hypertension this appears to be somewhat variable he is back on hydrochlorothiazide 12.5 mg a day this was added by Dr. Sheppard Coil previously We have titrated his lisinopril up to 40 mg a day-he is also on Lopressor 50 mg twice a day--which is noted above will reduce to 25 mg twice a day-- and Norvasc 10mg  a  day- Continues to have variable blood pressures this will have to be monitored-of note there is some question whether patient is receiving Norvasc 5 or 10 mg a day will discuss this with nursing--   #5-hypokalemia-apparently this was aggressively repleted in the hospital because of presence of PVCs and nonsustained ventricular tachycardia in the hospital--this has been stable on supplementation Will update a metabolic panel    Was thoughtt hypokalemia borderline lowmagnesium was contributing to this.--He is on magnesium supplementation and Magnesium  level on lab inFebuary was 1.7 he is on supplementation Will update a level    #6 history of alcohol abuse this has not been an issue during his stay here he is on folic acid  [id number] of right knee pain- He says the tramadol is helping his continue to monitor.        #9 hyperlipidemia he is on a statin  --LDL at goal at 62 per lab on 01/01/2015- this appears iver function tests within normal limits in December 2016--he is on a statin will update liver function tests   #9-history of gout he is on allopurinol uric acid in December 2016 was within normal limits  #10 history of right lateral thorax abnormality-this appears fairly minimal today again CT scan did not show any acutely concerning process  At this point monitor for any changes  #11 history of right great toe wound-again this does not appear to be infected wound care is following this closely will monitor they are applying topical treatment at this point.  [id number] note greater than 35 minutes spent assessing patient-discussing his concerns at bedside--reviewing his chart-and coordinating and formulating a plan of care for numerous diagnoses-of note greater than 50% of time spent coordinating plan of care         .Marland Kitchen    Cobin Cadavid C,

## 2015-06-16 DIAGNOSIS — E612 Magnesium deficiency: Secondary | ICD-10-CM | POA: Diagnosis not present

## 2015-06-16 DIAGNOSIS — E119 Type 2 diabetes mellitus without complications: Secondary | ICD-10-CM | POA: Diagnosis not present

## 2015-06-16 DIAGNOSIS — I10 Essential (primary) hypertension: Secondary | ICD-10-CM | POA: Diagnosis not present

## 2015-06-16 LAB — CBC AND DIFFERENTIAL
HCT: 40 % — AB (ref 41–53)
Hemoglobin: 13.3 g/dL — AB (ref 13.5–17.5)
Platelets: 230 10*3/uL (ref 150–399)
WBC: 6.6 10^3/mL

## 2015-06-16 LAB — HEPATIC FUNCTION PANEL
ALT: 27 U/L (ref 10–40)
AST: 26 U/L (ref 14–40)
Alkaline Phosphatase: 74 U/L (ref 25–125)
Bilirubin, Total: 0.4 mg/dL

## 2015-06-16 LAB — HEMOGLOBIN A1C: Hemoglobin A1C: 5.6

## 2015-06-16 LAB — TSH: TSH: 1.34 u[IU]/mL (ref <=5.90)

## 2015-06-16 LAB — BASIC METABOLIC PANEL
BUN: 14 mg/dL (ref 4–21)
Creatinine: 0.8 mg/dL (ref <=1.3)
GLUCOSE: 93 mg/dL
POTASSIUM: 3.7 mmol/L (ref 3.4–5.3)
Sodium: 142 mmol/L (ref 137–147)

## 2015-06-30 NOTE — Progress Notes (Signed)
MRN: [id number] Name: Juan [name]  Sex: male Age: 65 y.o. DOB: [birth year]  [place] #: [name] Facility/Room: Level Of Care: SNF Provider:Levaughn Puccinelli, Webb Silversmith MD  Emergency Contacts: Extended Emergency Contact Information Primary Emergency Contact: Bibbins,Margie A. Address: [place], [place] Phone: [phone] Work Phone: [phone] Relation: Spouse Secondary Emergency Contact: Real Cons States of [place] Phone: [phone] Relation: Son  Code Status: FullCode  Allergies: Review of patient's allergies indicates no known allergies.  Chief Complaint  Patient presents with  . Medical Management of Chronic Issues    HPI: Patient is 65 y.o. male who is being seen for routine issues of DM2, ETOH abuse and depression.  Past Medical History  Diagnosis Date  . Hypertension   . Gout   . Obesity   . Hyperlipidemia   . Diabetes mellitus     type II  . Paroxysmal atrial fibrillation (HCC)     postoperatively  . DIABETES MELLITUS, TYPE II 07/27/2007  . GOUT 12/01/2006  . HYPERLIPIDEMIA 01/19/2007  . HYPERTENSION 12/01/2006  . 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 OBESITY, HX OF 12/01/2006  . Cataract   . Hx of colonic polyps 05/30/2008    Past Surgical History  Procedure Laterality Date  . Cholecystectomy      Laparoscopic  . Cataract extraction      bilateral  . Colonoscopy    . Tee without cardioversion N/A 08/20/2014    Procedure: TRANSESOPHAGEAL ECHOCARDIOGRAM (TEE);  Surgeon: Sueanne Margarita, MD;  Location[facility] ENDOSCOPY;  Service: Cardiovascular;  Laterality: N/A;  PT NEED A LOOP      Medication List       This list is accurate as of: 07/01/15 11:59 PM.  Always use your most recent med list.               allopurinol 100 MG tablet  Commonly known as:  ZYLOPRIM  Take 1 tablet (100 mg total) by mouth daily.     AMLODIPINE BESYLATE PO  Take 5 mg by mouth daily.     aspirin 81 MG  tablet  Take 81 mg by mouth daily.     atorvastatin 80 MG tablet  Commonly known as:  LIPITOR  Take 1 tablet (80 mg total) by mouth daily.     escitalopram 5 MG tablet  Commonly known as:  LEXAPRO  Take 5 mg by mouth daily.     folic acid 1 MG tablet  Commonly known as:  FOLVITE  Take 1 mg by mouth daily.     hydrochlorothiazide 12.5 MG capsule  Commonly known as:  MICROZIDE  Take 12.5 mg by mouth daily.     insulin aspart 100 UNIT/ML injection  Commonly known as:  novoLOG  Three times a day prior to meals based on following sliding scale. CBG < 70: implement hypoglycemia protocol CBG 70 - 120: 0 units CBG 121 - 150: 2 units CBG 151 - 200: 3 units CBG 201 - 250: 5 units CBG 251 - 300: 8 units and call MD     insulin glargine 100 UNIT/ML injection  Commonly known as:  LANTUS  Inject 0.1 mLs (10 Units total) into the skin at bedtime.     lisinopril 20 MG tablet  Commonly known as:  PRINIVIL,ZESTRIL  Take 1 tablet (20 mg total) by mouth daily.     magnesium oxide 400 (241.3 Mg) MG tablet  Commonly known as:  MAG-OX  Take 1 tablet (400 mg total) by mouth 2 (two) times daily.     metoprolol 50 MG tablet  Commonly known as:  LOPRESSOR  Take 1 tablet (50 mg total) by mouth 2 (two) times daily with a meal.     polyethylene glycol packet  Commonly known as:  MIRALAX / GLYCOLAX  Take 17 g by mouth daily.     potassium chloride SA 20 MEQ tablet  Commonly known as:  K-DUR,KLOR-CON  Take 20 mEq by mouth 2 (two) times daily.     thiamine 100 MG tablet  Commonly known as:  VITAMIN B-1  Take 100 mg by mouth daily.     traMADol 50 MG tablet  Commonly known as:  ULTRAM  Take one tablet by mouth twice daily for pain. Hold for sedation        Meds ordered this encounter  Medications  . aspirin 81 MG tablet    Sig: Take 81 mg by mouth daily.  . folic acid (FOLVITE) 1 MG tablet    Sig: Take 1 mg by mouth daily.  Marland Kitchen thiamine (VITAMIN B-1) 100 MG tablet    Sig: Take 100 mg by  mouth daily.  Marland Kitchen AMLODIPINE BESYLATE PO    Sig: Take 5 mg by mouth daily.  Marland Kitchen escitalopram (LEXAPRO) 5 MG tablet    Sig: Take 5 mg by mouth daily.  . polyethylene glycol (MIRALAX / GLYCOLAX) packet    Sig: Take 17 g by mouth daily.    Immunization History  Administered Date(s) Administered  . Influenza Split 02/25/2011  . Influenza Whole 03/07/1997, 01/19/2007, 01/24/2008, 01/19/2009, 01/19/2010  . Influenza-Unspecified 12/08/2014  . PPD Test 09/09/2014  . Pneumococcal Conjugate-13 12/04/2013  . Pneumococcal Polysaccharide-23 01/24/2008  . Td 03/07/1993, 01/24/2008  . Zoster 01/19/2010    Social History  Substance Use Topics  . Smoking status: Current Every Day Smoker    Types: Pipe  . Smokeless tobacco: Never Used     Comment: occasional  . Alcohol Use: 0.0 oz/week    0 Standard drinks or equivalent per week     Comment: 2 drinks a day-beer    Review of Systems  DATA OBTAINED: from patient, nurse- no c/o GENERAL:  no fevers, fatigue, appetite changes SKIN: No itching, rash HEENT: No complaint RESPIRATORY: No cough, wheezing, SOB CARDIAC: No chest pain, palpitations, lower extremity edema  GI: No abdominal pain, No N/V/D or constipation, No heartburn or reflux  GU: No dysuria, frequency or urgency, or incontinence  MUSCULOSKELETAL: No unrelieved bone/joint pain NEUROLOGIC: No headache, dizziness  PSYCHIATRIC: No overt anxiety or sadness  Filed Vitals:   07/05/15 1253  BP: 156/89  Pulse: 48  Temp: 97 F (36.1 C)  Resp: 18    Physical Exam  GENERAL APPEARANCE: Alert, conversant, No acute distress  SKIN: No diaphoresis rash, or wounds HEENT: Unremarkable RESPIRATORY: Breathing is even, unlabored. Lung sounds are clear   CARDIOVASCULAR: Heart RRR no murmurs, rubs or gallops. No peripheral edema  GASTROINTESTINAL: Abdomen is soft, non-tender, not distended w/ normal bowel sounds.  GENITOURINARY: Bladder non tender, not distended  MUSCULOSKELETAL: No abnormal  joints or musculature NEUROLOGIC: Cranial nerves 2-12 grossly intact; L side weakness PSYCHIATRIC: Mood and affect appropriate to situation, no behavioral issues  Patient Active Problem List   Diagnosis Date Noted  . Depression 07/05/2015  . History of CVA (cerebrovascular accident) 06/11/2015  . Diabetes mellitus type 2, controlled, with complications ([place]) 123XX123  . Left groin pain 04/09/2015  .  Pain in joint, lower leg 11/13/2014  . Hypokalemia 08/22/2014  . Alcohol use ([place]) 08/22/2014  . NSVT (nonsustained ventricular tachycardia) ([place])   . Acute ischemic stroke ([place]) 08/18/2014  . Increased prostate specific antigen (PSA) velocity 02/25/2011  . Preventative health care 07/18/2010  . Atrial fibrillation ([place]) 03/09/2010  . 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 Hx of colonic polyps 05/30/2008  . Diabetes mellitus type 2, uncontrolled, with complications ([place]) 123456  . Hyperlipidemia 01/19/2007  . GOUT 12/01/2006  . Essential hypertension 12/01/2006  . OBESITY, HX OF 12/01/2006    CBC    Component Value Date/Time   WBC 6.6 06/16/2015   WBC 9.7 08/20/2014 0620   RBC 5.02 08/20/2014 0620   HGB 13.3* 06/16/2015   HCT 40* 06/16/2015   PLT 230 06/16/2015   MCV 87.6 08/20/2014 0620   LYMPHSABS 1.6 08/18/2014 2026   MONOABS 0.5 08/18/2014 2026   EOSABS 0.0 08/18/2014 2026   BASOSABS 0.0 08/18/2014 2026    CMP     Component Value Date/Time   NA 142 06/16/2015   NA 135 08/21/2014 0623   K 3.7 06/16/2015   CL 101 08/21/2014 0623   CO2 21* 08/21/2014 0623   GLUCOSE 139* 08/21/2014 0623   BUN 14 06/16/2015   BUN 9 08/21/2014 0623   CREATININE 0.8 06/16/2015   CREATININE 0.90 08/21/2014 0623   CALCIUM 8.8* 08/21/2014 0623   PROT 7.1 08/18/2014 2026   ALBUMIN 3.4* 08/18/2014 2026   AST 26 06/16/2015   ALT 27 06/16/2015   ALKPHOS 74 06/16/2015   BILITOT 0.9 08/18/2014 2026   GFRNONAA >60 08/21/2014 0623   GFRAA >60 08/21/2014 0623    Assessment and  Plan  Diabetes mellitus type 2, uncontrolled, with complications Pt is on lantus and novolog with good result ;A1c was 5.6 on 4/11; pt is on ACE and lipitor;cont all meds  Alcohol use Pt has had no ETOH sinc ein SNF ;has been doing well;has been on thiamine and folate;will check levels, he can probably get off these meds  Depression Pt is stable on lexapro 5 mg daily; cont lexapro 5 mg daily      Inocencio Homes MD

## 2015-07-01 ENCOUNTER — Non-Acute Institutional Stay (SKILLED_NURSING_FACILITY): Payer: Medicare Other | Admitting: Internal Medicine

## 2015-07-01 DIAGNOSIS — Z794 Long term (current) use of insulin: Secondary | ICD-10-CM | POA: Diagnosis not present

## 2015-07-01 DIAGNOSIS — Z789 Other specified health status: Secondary | ICD-10-CM | POA: Diagnosis not present

## 2015-07-01 DIAGNOSIS — IMO0002 Reserved for concepts with insufficient information to code with codable children: Secondary | ICD-10-CM

## 2015-07-01 DIAGNOSIS — E1165 Type 2 diabetes mellitus with hyperglycemia: Secondary | ICD-10-CM

## 2015-07-01 DIAGNOSIS — F32A Depression, unspecified: Secondary | ICD-10-CM

## 2015-07-01 DIAGNOSIS — F329 Major depressive disorder, single episode, unspecified: Secondary | ICD-10-CM | POA: Diagnosis not present

## 2015-07-01 DIAGNOSIS — E118 Type 2 diabetes mellitus with unspecified complications: Secondary | ICD-10-CM

## 2015-07-01 DIAGNOSIS — Z7289 Other problems related to lifestyle: Secondary | ICD-10-CM

## 2015-07-02 ENCOUNTER — Encounter: Payer: Self-pay | Admitting: Internal Medicine

## 2015-07-02 ENCOUNTER — Non-Acute Institutional Stay (SKILLED_NURSING_FACILITY): Payer: Medicare Other | Admitting: Internal Medicine

## 2015-07-02 DIAGNOSIS — R001 Bradycardia, unspecified: Secondary | ICD-10-CM

## 2015-07-02 DIAGNOSIS — I4891 Unspecified atrial fibrillation: Secondary | ICD-10-CM

## 2015-07-02 DIAGNOSIS — I10 Essential (primary) hypertension: Secondary | ICD-10-CM

## 2015-07-02 NOTE — Progress Notes (Signed)
Location:  [facility] Room Number: 421-W Place of Service:  SNF [phone])   Cathlean Cower, MD  Patient Care Team: Biagio Borg, MD as PCP - General  Extended Emergency Contact Information Primary Emergency Contact: Matty,Margie A. Address: [address], [place] Phone: [phone] Work Phone: [phone] Relation: Spouse Secondary Emergency Contact: [place] Phone: [phone] Relation: Son  Code Status: FullCode Goals of care: Advanced Directive information Advanced Directives 07/02/2015  Does patient have an advance directive? No  Would patient like information on creating an advanced directive? No - patient declined information     Chief Complaint  Patient presents with  . Acute Visit  Secondary to bradycardia-follow-up hypertension  HPI:  Juan [name] is a 65 y.o. male seen today for an acute visit for follow-up of hypertension-bradycardia.  He does have a history of atrial fibrillation and is on Lopressor currently 25 mg twice a day-nonetheless appears his pulses often are below 60 I got 50 on exam today he is not showing any signs of syncope or distress certainly.  He is also has variable blood pressures he is on Norvasc 10 mg a day hydrochlorothiazide 12.5 mg-once a day-lisinopril 40 mg a day-in addition to Lopressor I note there appears to be some confusion whether he is receiving 5 or 10 mg a day of Norvasc  I got 145/88 on exam today manually I see previous ones 120/70-119/71-147/78-appears he does not have consistent elevations but this will have to be watched.     Past Medical History  Diagnosis Date  . Hypertension   . Gout   . Obesity   . Hyperlipidemia   . Diabetes mellitus     type II  . Paroxysmal atrial fibrillation (HCC)     postoperatively  . DIABETES MELLITUS, TYPE II 07/27/2007  . GOUT 12/01/2006  . HYPERLIPIDEMIA 01/19/2007    . HYPERTENSION 12/01/2006  . 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 OBESITY, HX OF 12/01/2006  . Cataract   . Hx of colonic polyps 05/30/2008   Past Surgical History  Procedure Laterality Date  . Cholecystectomy      Laparoscopic  . Cataract extraction      bilateral  . Colonoscopy    . Tee without cardioversion N/A 08/20/2014    Procedure: TRANSESOPHAGEAL ECHOCARDIOGRAM (TEE);  Surgeon: Sueanne Margarita, MD;  Location[facility] ENDOSCOPY;  Service: Cardiovascular;  Laterality: N/A;  Juan [name] NEED A LOOP    No Known Allergies    Medication List       This list is accurate as of: 07/02/15 12:27 PM.  Always use your most recent med list.               allopurinol 100 MG tablet  Commonly known as:  ZYLOPRIM  Take 1 tablet (100 mg total) by mouth daily.     AMLODIPINE BESYLATE PO  Take 5 mg by mouth daily.     aspirin 81 MG tablet  Take 81 mg by mouth daily.     atorvastatin 80 MG tablet  Commonly known as:  LIPITOR  Take 1 tablet (80 mg total) by mouth daily.     docusate sodium 100 MG capsule  Commonly known as:  COLACE  Take 100 mg by mouth 2 (two) times daily.     escitalopram 5 MG tablet  Commonly known as:  LEXAPRO  Take 5 mg by  mouth daily.     folic acid 1 MG tablet  Commonly known as:  FOLVITE  Take 1 mg by mouth daily.     hydrochlorothiazide 12.5 MG capsule  Commonly known as:  MICROZIDE  Take 12.5 mg by mouth daily.     insulin aspart 100 UNIT/ML injection  Commonly known as:  novoLOG  Three times a day prior to meals based on following sliding scale. CBG < 70: implement hypoglycemia protocol CBG 70 - 120: 0 units CBG 121 - 150: 2 units CBG 151 - 200: 3 units CBG 201 - 250: 5 units CBG 251 - 300: 8 units and call MD     insulin glargine 100 UNIT/ML injection  Commonly known as:  LANTUS  Inject 0.1 mLs (10 Units total) into the skin at bedtime.     lisinopril 20 MG tablet  Commonly known as:  PRINIVIL,ZESTRIL  Take 1 tablet (20 mg total) by mouth daily.      magnesium oxide 400 (241.3 Mg) MG tablet  Commonly known as:  MAG-OX  Take 1 tablet (400 mg total) by mouth 2 (two) times daily.     metoprolol 50 MG tablet  Commonly known as:  LOPRESSOR  Take 1 tablet (50 mg total) by mouth 2 (two) times daily with a meal.     polyethylene glycol packet  Commonly known as:  MIRALAX / GLYCOLAX  Take 17 g by mouth daily.     potassium chloride SA 20 MEQ tablet  Commonly known as:  K-DUR,KLOR-CON  Take 20 mEq by mouth 2 (two) times daily.     thiamine 100 MG tablet  Commonly known as:  VITAMIN B-1  Take 100 mg by mouth daily.     traMADol 50 MG tablet  Commonly known as:  ULTRAM  Take one tablet by mouth twice daily for pain. Hold for sedation      Of note current Lopressor is 25 mg twice a day-  Review of Systems   In general no complaints of fever or chills says he feels well.  Skin does not complain of rashes or itching.  Head ears eyes nose mouth and throat no complaints of visual changes or sore throat.  Respiratory is not complaining of shortness breath or cough.  Cardiac no chest pain.  GI is not complaining of nausea vomiting diarrhea constipation or abdominal discomfort.  Musculoskeletal is not complaining of any acute joint pain this has been an issue in the past but apparently is stabilized.  Neurologic is not complaining of dizziness or headache.  Psych does not complain of anxiety currently or overt depression.      Immunization History  Administered Date(s) Administered  . Influenza Split 02/25/2011  . Influenza Whole 03/07/1997, 01/19/2007, 01/24/2008, 01/19/2009, 01/19/2010  . Influenza-Unspecified 12/08/2014  . PPD Test 09/09/2014  . Pneumococcal Conjugate-13 12/04/2013  . Pneumococcal Polysaccharide-23 01/24/2008  . Td 03/07/1993, 01/24/2008  . Zoster 01/19/2010   Pertinent  Health Maintenance Due  Topic Date Due  . PNA vac Low Risk Adult (2 of 2 - PPSV23) 12/05/2014  . INFLUENZA VACCINE  10/06/2015    . HEMOGLOBIN A1C  12/16/2015  . OPHTHALMOLOGY EXAM  04/27/2016  . FOOT EXAM  05/27/2016  . COLONOSCOPY  02/13/2019   Fall Risk  12/04/2013  Falls in the past year? No   Functional Status Survey:    Filed Vitals:   07/02/15 1151  BP: 140/82  Pulse: 49   There is no weight on file to calculate  BMI. Physical Exam   Gen. this a pleasant male in no distress lying comfortably in bed.  His skin is warm and dry.  Oropharynx clear mucous membranes moist.  Chest is clear to auscultation there is no labored breathing.  Heart is regular rhythm with a rate of 50 beats a minute-without murmur gallop or rub.  Abdomen is soft nontender positive bowel sounds.  Extremities does have significant lower extremity weakness which is not new he does have some shaking of his feet when they're touched this is not totally new either-both feet are in protective boot currently He does have a history of right sided mild facial weakness and left-sided upper and lower extremity weakness.   Neurologic as noted above  Of note  On the distal right great toe there is a small open area this does not appear to be infected there is no surrounding erythema drainage or bleeding wound care is following this  Labs reviewed:  Recent Labs  08/20/14 0620 08/20/14 1719 08/21/14 0623 08/22/14 08/22/14 1423 06/16/15  NA 132* 135 135 136*  --  142  K 3.1* 3.8 3.8  --  3.6 3.7  CL 99* 104 101  --   --   --   CO2 22 25 21*  --   --   --   GLUCOSE 145* 152* 139*  --   --   --   BUN 6 7 9   --  19 14  CREATININE 0.83 0.87 0.90  --  1.0 0.8  CALCIUM 8.5* 8.6* 8.8*  --   --   --   MG 1.7  --  1.8  --   --   --     Recent Labs  08/18/14 2026 06/16/15  AST 19 26  ALT 13* 27  ALKPHOS 75 74  BILITOT 0.9  --   PROT 7.1  --   ALBUMIN 3.4*  --     Recent Labs  08/18/14 2026  08/20/14 0620 08/22/14 1423 06/16/15  WBC 9.1  --  9.7 9.4 6.6  NEUTROABS 7.0  --   --   --   --   HGB 16.2  < > 15.4 15.4 13.3*   HCT 46.0  < > 44.0 47 40*  MCV 89.1  --  87.6  --   --   PLT 243  --  245 232 230  < > = values in this interval not displayed. Lab Results  Component Value Date   TSH 1.34 06/16/2015   Lab Results  Component Value Date   HGBA1C 5.6 06/16/2015   Lab Results  Component Value Date   CHOL 206* 08/20/2014   HDL 35* 08/20/2014   LDLCALC 135* 08/20/2014   LDLDIRECT 193.0 06/03/2014   TRIG 182* 08/20/2014   CHOLHDL 5.9 08/20/2014    Significant Diagnostic Results in last 30 days:    Assessment/Plan  #1-atrial fibrillation-this appears rate controlled on Lopressor has frequent readings in the 50s however and I believe some 40s per chart review --he is not overtly symptomatic-but will reduce his Lopressor down to 12.5 mg twice a day and monitor.  He continues on aspirin for anticoagulation.  #2 hypertension variable blood pressure readings I have reviewed his Morrin Neisseria readings ranging from 104/70 up to 138/80 6 in the morning later in the day blood pressure appears to rise somewhat with occasional systolics in the 0000000 although I do not see consistency here-at this point will monitor continue current blood pressure medications including Norvasc  hydrochlorothiazide and Lopressor as well as lisinopril-there is some question whether he is receiving Norvasc 5 or 10 mg a day will need to clarify this with nursing  [phone]

## 2015-07-03 DIAGNOSIS — F329 Major depressive disorder, single episode, unspecified: Secondary | ICD-10-CM | POA: Diagnosis not present

## 2015-07-05 ENCOUNTER — Encounter: Payer: Self-pay | Admitting: Internal Medicine

## 2015-07-05 DIAGNOSIS — F329 Major depressive disorder, single episode, unspecified: Secondary | ICD-10-CM | POA: Insufficient documentation

## 2015-07-05 DIAGNOSIS — F32A Depression, unspecified: Secondary | ICD-10-CM | POA: Insufficient documentation

## 2015-07-05 DIAGNOSIS — R001 Bradycardia, unspecified: Secondary | ICD-10-CM | POA: Insufficient documentation

## 2015-07-05 NOTE — Progress Notes (Signed)
Patient ID: Juan [name], male   DOB: [birth year], 65 y.o.   MRN: [id number]

## 2015-07-05 NOTE — Assessment & Plan Note (Signed)
Pt has had no ETOH sinc ein SNF ;has been doing well;has been on thiamine and folate;will check levels, he can probably get off these meds

## 2015-07-05 NOTE — Assessment & Plan Note (Signed)
Pt is stable on lexapro 5 mg daily; cont lexapro 5 mg daily

## 2015-07-05 NOTE — Assessment & Plan Note (Signed)
Pt is on lantus and novolog with good result ;A1c was 5.6 on 4/11; pt is on ACE and lipitor;cont all meds

## 2015-07-06 ENCOUNTER — Non-Acute Institutional Stay (SKILLED_NURSING_FACILITY): Payer: Medicare Other | Admitting: Internal Medicine

## 2015-07-06 ENCOUNTER — Encounter: Payer: Self-pay | Admitting: Internal Medicine

## 2015-07-06 DIAGNOSIS — M25552 Pain in left hip: Secondary | ICD-10-CM | POA: Diagnosis not present

## 2015-07-06 DIAGNOSIS — M25569 Pain in unspecified knee: Secondary | ICD-10-CM | POA: Insufficient documentation

## 2015-07-06 DIAGNOSIS — I10 Essential (primary) hypertension: Secondary | ICD-10-CM

## 2015-07-06 DIAGNOSIS — M25561 Pain in right knee: Secondary | ICD-10-CM | POA: Diagnosis not present

## 2015-07-06 DIAGNOSIS — M25551 Pain in right hip: Secondary | ICD-10-CM | POA: Diagnosis not present

## 2015-07-06 DIAGNOSIS — R001 Bradycardia, unspecified: Secondary | ICD-10-CM | POA: Diagnosis not present

## 2015-07-06 NOTE — Progress Notes (Signed)
Patient ID: Juan [name], male   DOB: 07/03/1950, 65 y.o.   MRN: [id number]  Location:  [place] of Service:  SNF [phone])   Cathlean Cower, MD  Patient Care Team: Biagio Borg, MD as PCP - [name]  Extended Emergency Contact Information Primary Emergency Contact: Mosquera,Margie A. Address: [place], [place] Phone: [phone] Work Phone: [phone] Relation: Spouse Secondary Emergency Contact: [place] of [place] Phone: [phone] Relation: Son  Code Status: FullCode Goals of care: Advanced Directive information Advanced Directives 07/02/2015  Does patient have an advance directive? No  Would patient like information on creating an advanced directive? No - patient declined information     Chief Complaint  Patient presents with  . Acute Visit  Secondary to right knee pain-follow-up bradycardia and hypertension  HPI:  Pt is a 65 y.o. male seen today for an acute visit for follow-up of hypertension-bradycardia.--As well as complaints of right knee pain Patient has complained of right knee pain in the past x-rays have shown osteoarthritis-venous Doppler was negative for any DVT.  He is receiving tramadol every 6 hours as needed for pain he is also receiving Naprosyn he says this does help some.  We also reduced his Lopressor dose from 25 mg twice a day down to 12.5 mg twice a day secondary to bradycardic readings this appears to be improved pulse is 80 on exam today.  He also does have a history of hypertension with a history of variable systolics this appears to continue to be the case I did get 138/82 manually today-his see previous 127/71-I also see 175/84 although this appears to be unusual.  He is on numerous agents including hydrochlorothiazide 12.5 mg a day-Lopressor 12.5 mg twice a day-lisinopril 40 mg a day and Norvasc I believe 5 mg a  day.             Past Medical History  Diagnosis Date  . Hypertension   . Gout   . Obesity   . Hyperlipidemia   . Diabetes mellitus     type II  . Paroxysmal atrial fibrillation (HCC)     postoperatively  . DIABETES MELLITUS, TYPE II 07/27/2007  . GOUT 12/01/2006  . HYPERLIPIDEMIA 01/19/2007  . HYPERTENSION 12/01/2006  . 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 OBESITY, HX OF 12/01/2006  . Cataract   . Hx of colonic polyps 05/30/2008   Past Surgical History  Procedure Laterality Date  . Cholecystectomy      Laparoscopic  . Cataract extraction      bilateral  . Colonoscopy    . Tee without cardioversion N/A 08/20/2014    Procedure: TRANSESOPHAGEAL ECHOCARDIOGRAM (TEE);  Surgeon: Sueanne Margarita, MD;  Location: [facility] ENDOSCOPY;  Service: Cardiovascular;  Laterality: N/A;  PT NEED A LOOP    No Known Allergies    Medication List       This list is accurate as of: 07/06/15 11:59 PM.  Always use your most recent med list.               allopurinol 100 MG tablet  Commonly known as:  ZYLOPRIM  Take 1 tablet (100 mg total) by mouth daily.     AMLODIPINE BESYLATE PO  Take 5 mg by mouth daily.     aspirin 81 MG tablet  Take 81 mg by mouth daily.     atorvastatin  80 MG tablet  Commonly known as:  LIPITOR  Take 1 tablet (80 mg total) by mouth daily.     docusate sodium 100 MG capsule  Commonly known as:  COLACE  Take 100 mg by mouth 2 (two) times daily.     escitalopram 5 MG tablet  Commonly known as:  LEXAPRO  Take 5 mg by mouth daily.     folic acid 1 MG tablet  Commonly known as:  FOLVITE  Take 1 mg by mouth daily.     hydrochlorothiazide 12.5 MG capsule  Commonly known as:  MICROZIDE  Take 12.5 mg by mouth daily.     insulin aspart 100 UNIT/ML injection  Commonly known as:  novoLOG  Three times a day prior to meals based on following sliding scale. CBG < 70: implement hypoglycemia protocol CBG 70 - 120: 0 units CBG 121 - 150: 2 units CBG 151 - 200: 3  units CBG 201 - 250: 5 units CBG 251 - 300: 8 units and call MD     insulin glargine 100 UNIT/ML injection  Commonly known as:  LANTUS  Inject 0.1 mLs (10 Units total) into the skin at bedtime.     lisinopril 20 MG tablet  Commonly known as:  PRINIVIL,ZESTRIL  Take 1 tablet (20 mg total) by mouth daily.     magnesium oxide 400 (241.3 Mg) MG tablet  Commonly known as:  MAG-OX  Take 1 tablet (400 mg total) by mouth 2 (two) times daily.     metoprolol 50 MG tablet  Commonly known as:  LOPRESSOR  Take 1 tablet (50 mg total) by mouth 2 (two) times daily with a meal.     polyethylene glycol packet  Commonly known as:  MIRALAX / GLYCOLAX  Take 17 g by mouth daily.     potassium chloride SA 20 MEQ tablet  Commonly known as:  K-DUR,KLOR-CON  Take 20 mEq by mouth 2 (two) times daily.     thiamine 100 MG tablet  Commonly known as:  VITAMIN B-1  Take 100 mg by mouth daily.     traMADol 50 MG tablet  Commonly known as:  ULTRAM  Take one tablet by mouth twice daily for pain. Hold for sedation      Of note current Lopressor is 12.5 mg twice a day- He is also receiving Naprosyn in addition to the tramadol for knee pain  Review of Systems   In [name] no complaints of fever or chills says he feels well.  Skin does not complain of rashes or itching.  Head ears eyes nose mouth and throat no complaints of visual changes or sore throat.  Respiratory is not complaining of shortness breath or cough.  Cardiac no chest pain.  GI is not complaining of nausea vomiting diarrhea constipation or abdominal discomfort.  Musculoskeletal is complaing of increased right knee pain again he denies any trauma here.  Neurologic is not complaining of dizziness or headache.  Psych does not complain of anxiety currently or overt depression.      Immunization History  Administered Date(s) Administered  . Influenza Split 02/25/2011  . Influenza Whole 03/07/1997, 01/19/2007, 01/24/2008,  01/19/2009, 01/19/2010  . Influenza-Unspecified 12/08/2014  . PPD Test 09/09/2014  . Pneumococcal Conjugate-13 12/04/2013  . Pneumococcal Polysaccharide-23 01/24/2008  . Td 03/07/1993, 01/24/2008  . Zoster 01/19/2010   Pertinent  Health Maintenance Due  Topic Date Due  . PNA vac Low Risk Adult (2 of 2 - PPSV23) 12/05/2014  . INFLUENZA VACCINE  10/06/2015  . HEMOGLOBIN A1C  12/16/2015  . OPHTHALMOLOGY EXAM  04/27/2016  . FOOT EXAM  05/27/2016  . COLONOSCOPY  02/13/2019   Fall Risk  12/04/2013  Falls in the past year? No   Functional Status Survey:    Filed Vitals:   07/06/15 2047  BP: 138/82  Pulse: 80  Resp: 16    Physical Exam   Gen. this a pleasant male in no distress lying comfortably in bed.  His skin is warm and dry.  Oropharynx clear mucous membranes moist.  Chest is clear to auscultation there is no labored breathing.  Heart is regular rhythm with a rate of 80 beats a minute-without murmur gallop or rub.  Abdomen is soft nontender positive bowel sounds.  Extremities does have significant lower extremity weakness which is not new he does have some shaking of his feet when they're touched this is not totally new either He does have limited range of motion of both knees I do not see increased edema on the right knee versus the left or erythema-there is minimal warmth of both knees exam is relatively baseline with previous exam-there is very limited range of motion of the knees bilaterally He does have a history of right sided mild facial weakness and left-sided upper and lower extremity weakness.   Neurologic as noted above  Of note     Labs reviewed:  Recent Labs  08/20/14 0620 08/20/14 1719 08/21/14 0623 08/22/14 08/22/14 1423 06/16/15  NA 132* 135 135 136*  --  142  K 3.1* 3.8 3.8  --  3.6 3.7  CL 99* 104 101  --   --   --   CO2 22 25 21*  --   --   --   GLUCOSE 145* 152* 139*  --   --   --   BUN 6 7 9   --  19 14  CREATININE 0.83 0.87 0.90   --  1.0 0.8  CALCIUM 8.5* 8.6* 8.8*  --   --   --   MG 1.7  --  1.8  --   --   --     Recent Labs  08/18/14 2026 06/16/15  AST 19 26  ALT 13* 27  ALKPHOS 75 74  BILITOT 0.9  --   PROT 7.1  --   ALBUMIN 3.4*  --     Recent Labs  08/18/14 2026  08/20/14 0620 08/22/14 1423 06/16/15  WBC 9.1  --  9.7 9.4 6.6  NEUTROABS 7.0  --   --   --   --   HGB 16.2  < > 15.4 15.4 13.3*  HCT 46.0  < > 44.0 47 40*  MCV 89.1  --  87.6  --   --   PLT 243  --  245 232 230  < > = values in this interval not displayed. Lab Results  Component Value Date   TSH 1.34 06/16/2015   Lab Results  Component Value Date   HGBA1C 5.6 06/16/2015   Lab Results  Component Value Date   CHOL 206* 08/20/2014   HDL 35* 08/20/2014   LDLCALC 135* 08/20/2014   LDLDIRECT 193.0 06/03/2014   TRIG 182* 08/20/2014   CHOLHDL 5.9 08/20/2014    Significant Diagnostic Results in last 30 days:    Assessment/Plan  #1 right knee discomfort previous x-ray shift shown osteoarthritis will recheck this-for pain management will increase tramadol to 2 tabs every 6 hours as needed for severe pain continue 1 tab  every 6 hours for moderate pain this was discussed with Dr. Sheppard Coil via phone.  #2 hypertension this appears to be stable although there is some variability at this point will monitor again continues as noted above on hydrochlorothiazide 12.5 mg a day-lisinopril 40 mg a day-Lopressor 12.5 mg twice a day-and Norvasc.  #3 bradycardia this appears improved with the decreased dose of Lopressor at this point monitor he appears to be stable clinically in this regards.  [id number]

## 2015-07-14 ENCOUNTER — Other Ambulatory Visit: Payer: Self-pay | Admitting: Internal Medicine

## 2015-07-14 DIAGNOSIS — M25551 Pain in right hip: Secondary | ICD-10-CM

## 2015-07-14 DIAGNOSIS — M8700 Idiopathic aseptic necrosis of unspecified bone: Secondary | ICD-10-CM

## 2015-07-14 DIAGNOSIS — M8580 Other specified disorders of bone density and structure, unspecified site: Secondary | ICD-10-CM

## 2015-07-22 ENCOUNTER — Ambulatory Visit (HOSPITAL_COMMUNITY)
Admission: RE | Admit: 2015-07-22 | Discharge: 2015-07-22 | Disposition: A | Discharge status: Home / Self Care | Payer: Medicare Other | Source: Ambulatory Visit | Attending: Internal Medicine | Admitting: Internal Medicine

## 2015-07-22 DIAGNOSIS — M25551 Pain in right hip: Secondary | ICD-10-CM | POA: Insufficient documentation

## 2015-07-22 DIAGNOSIS — M25451 Effusion, right hip: Secondary | ICD-10-CM | POA: Diagnosis not present

## 2015-07-22 DIAGNOSIS — R600 Localized edema: Secondary | ICD-10-CM | POA: Diagnosis not present

## 2015-07-22 DIAGNOSIS — M25511 Pain in right shoulder: Secondary | ICD-10-CM | POA: Diagnosis not present

## 2015-08-05 ENCOUNTER — Other Ambulatory Visit: Payer: Self-pay | Admitting: *Deleted

## 2015-08-05 MED ORDER — TRAMADOL HCL 50 MG PO TABS: ORAL_TABLET | ORAL | Status: DC

## 2015-08-05 NOTE — Telephone Encounter (Signed)
[facility] 

## 2015-08-07 ENCOUNTER — Encounter: Payer: Self-pay | Admitting: Internal Medicine

## 2015-08-07 ENCOUNTER — Non-Acute Institutional Stay (SKILLED_NURSING_FACILITY): Payer: Medicare Other | Admitting: Internal Medicine

## 2015-08-07 DIAGNOSIS — I10 Essential (primary) hypertension: Secondary | ICD-10-CM | POA: Diagnosis not present

## 2015-08-07 DIAGNOSIS — I4891 Unspecified atrial fibrillation: Secondary | ICD-10-CM | POA: Diagnosis not present

## 2015-08-07 DIAGNOSIS — I6930 Unspecified sequelae of cerebral infarction: Secondary | ICD-10-CM

## 2015-08-07 NOTE — Progress Notes (Signed)
MRN: [id number] Name: Juan [name]  Sex: male Age: 65 y.o. DOB: August 05, 1950  [facility] #:  Facility/Room:  [name] / [address] Level Of Care: SNF Provider: Noah Delaine. Sheppard Coil , MD Emergency Contacts: Extended Emergency Contact Information Primary Emergency Contact: Clendenin,Margie A. Address: [place], [place] Phone: [phone] Work Phone: [phone] Relation: Spouse Secondary Emergency Contact: [place] of [place] Phone: [phone] Relation: Son  Code Status: Full Code  Allergies: Review of patient's allergies indicates no known allergies.  Chief Complaint  Patient presents with  . Medical Management of Chronic Issues    Routine Exam    HPI: Patient is 65 y.o. male who is being seen for routine issues of HTN, AF and ischemic stroke  Past Medical History  Diagnosis Date  . Hypertension   . Gout   . Obesity   . Hyperlipidemia   . Diabetes mellitus     type II  . Paroxysmal atrial fibrillation (HCC)     postoperatively  . DIABETES MELLITUS, TYPE II 07/27/2007  . GOUT 12/01/2006  . HYPERLIPIDEMIA 01/19/2007  . HYPERTENSION 12/01/2006  . 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 OBESITY, HX OF 12/01/2006  . Cataract   . Hx of colonic polyps 05/30/2008    Past Surgical History  Procedure Laterality Date  . Cholecystectomy      Laparoscopic  . Cataract extraction      bilateral  . Colonoscopy    . Tee without cardioversion N/A 08/20/2014    Procedure: TRANSESOPHAGEAL ECHOCARDIOGRAM (TEE);  Surgeon: Sueanne Margarita, MD;  Location: [facility] ENDOSCOPY;  Service: Cardiovascular;  Laterality: N/A;  PT NEED A LOOP      Medication List       This list is accurate as of: 08/07/15 11:59 PM.  Always use your most recent med list.               allopurinol 100 MG tablet  Commonly known as:  ZYLOPRIM  Take 1 tablet (100 mg total) by mouth daily.     AMLODIPINE BESYLATE PO  Take 5 mg by mouth  daily.     aspirin 81 MG chewable tablet  Chew 81 mg by mouth daily.     atorvastatin 80 MG tablet  Commonly known as:  LIPITOR  Take 1 tablet (80 mg total) by mouth daily.     docusate sodium 100 MG capsule  Commonly known as:  COLACE  Take 100 mg by mouth 2 (two) times daily. Hold for loose stools     folic acid 1 MG tablet  Commonly known as:  FOLVITE  Take 1 mg by mouth daily.     hydrochlorothiazide 12.5 MG capsule  Commonly known as:  MICROZIDE  Take 12.5 mg by mouth daily.     insulin aspart 100 UNIT/ML injection  Commonly known as:  novoLOG  Three times a day prior to meals based on following sliding scale. CBG < 70: implement hypoglycemia protocol CBG 70 - 120: 0 units CBG 121 - 150: 2 units CBG 151 - 200: 3 units CBG 201 - 250: 5 units CBG 251 - 300: 8 units and call MD     insulin glargine 100 UNIT/ML injection  Commonly known as:  LANTUS  Inject 0.1 mLs (10 Units total) into the skin at bedtime.     lisinopril 40 MG tablet  Commonly known as:  PRINIVIL,ZESTRIL  Take 40 mg  by mouth daily.     magnesium oxide 400 (241.3 Mg) MG tablet  Commonly known as:  MAG-OX  Take 1 tablet (400 mg total) by mouth 2 (two) times daily.     metoprolol tartrate 25 MG tablet  Commonly known as:  LOPRESSOR  Take 12.5 mg by mouth 2 (two) times daily. Hold for pulse less than 60     naproxen sodium 220 MG tablet  Commonly known as:  ANAPROX  Take 220 mg by mouth 2 (two) times daily with a meal.     polyethylene glycol packet  Commonly known as:  MIRALAX / GLYCOLAX  Take 17 g by mouth daily.     potassium chloride SA 20 MEQ tablet  Commonly known as:  K-DUR,KLOR-CON  Take 20 mEq by mouth 2 (two) times daily.     thiamine 100 MG tablet  Commonly known as:  VITAMIN B-1  Take 100 mg by mouth daily.     traMADol 50 MG tablet  Commonly known as:  ULTRAM  Take one tablet by mouth every 6 hours as needed for moderate pain. Take two tablets by mouth every 6 hours as needed for  severe pain        Meds ordered this encounter  Medications  . lisinopril (PRINIVIL,ZESTRIL) 40 MG tablet    Sig: Take 40 mg by mouth daily.  Marland Kitchen aspirin 81 MG chewable tablet    Sig: Chew 81 mg by mouth daily.  . metoprolol tartrate (LOPRESSOR) 25 MG tablet    Sig: Take 12.5 mg by mouth 2 (two) times daily. Hold for pulse less than 60  . naproxen sodium (ANAPROX) 220 MG tablet    Sig: Take 220 mg by mouth 2 (two) times daily with a meal.    Immunization History  Administered Date(s) Administered  . Influenza Split 02/25/2011  . Influenza Whole 03/07/1997, 01/19/2007, 01/24/2008, 01/19/2009, 01/19/2010  . Influenza-Unspecified 12/08/2014  . PPD Test 09/09/2014  . Pneumococcal Conjugate-13 12/04/2013  . Pneumococcal Polysaccharide-23 01/24/2008  . Td 03/07/1993, 01/24/2008  . Zoster 01/19/2010    Social History  Substance Use Topics  . Smoking status: Current Every Day Smoker    Types: Pipe  . Smokeless tobacco: Never Used     Comment: occasional  . Alcohol Use: 0.0 oz/week    0 Standard drinks or equivalent per week     Comment: 2 drinks a day-beer    Review of Systems  DATA OBTAINED: from patient, nurse GENERAL:  no fevers, fatigue, appetite changes SKIN: No itching, rash HEENT: No complaint RESPIRATORY: No cough, wheezing, SOB CARDIAC: No chest pain, palpitations, lower extremity edema  GI: No abdominal pain, No N/V/D or constipation, No heartburn or reflux  GU: No dysuria, frequency or urgency, or incontinence  MUSCULOSKELETAL: No unrelieved bone/joint pain NEUROLOGIC: No headache, dizziness  PSYCHIATRIC: No overt anxiety or sadness  Filed Vitals:   08/07/15 1436  BP: 156/89  Pulse: 60  Temp: 98 F (36.7 C)  Resp: 20    Physical Exam  GENERAL APPEARANCE: Alert, conversant, No acute distress  SKIN: No diaphoresis rash HEENT: Unremarkable RESPIRATORY: Breathing is even, unlabored. Lung sounds are clear   CARDIOVASCULAR: Heart RRR no murmurs, rubs or  gallops. No peripheral edema  GASTROINTESTINAL: Abdomen is soft, non-tender, not distended w/ normal bowel sounds.  GENITOURINARY: Bladder non tender, not distended  MUSCULOSKELETAL: No abnormal joints or musculature NEUROLOGIC: Cranial nerves 2-12 grossly intact; L side weakness PSYCHIATRIC: Mood and affect appropriate to situation, no behavioral issues  Patient Active Problem List   Diagnosis Date Noted  . Knee pain 07/06/2015  . Depression 07/05/2015  . Bradycardia 07/05/2015  . History of CVA (cerebrovascular accident) 06/11/2015  . Diabetes mellitus type 2, controlled, with complications ([place]) 123XX123  . Left groin pain 04/09/2015  . Pain in joint, lower leg 11/13/2014  . Hypokalemia 08/22/2014  . Alcohol use ([place]) 08/22/2014  . NSVT (nonsustained ventricular tachycardia) ([place])   . Acute ischemic stroke ([place]) 08/18/2014  . Increased prostate specific antigen (PSA) velocity 02/25/2011  . Preventative health care 07/18/2010  . Atrial fibrillation ([place]) 03/09/2010  . 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 Hx of colonic polyps 05/30/2008  . Diabetes mellitus type 2, uncontrolled, with complications ([place]) [id number]  . Hyperlipidemia 01/19/2007  . GOUT 12/01/2006  . Essential hypertension 12/01/2006  . OBESITY, HX OF 12/01/2006    CBC    Component Value Date/Time   WBC 6.6 06/16/2015   WBC 9.7 08/20/2014 0620   RBC 5.02 08/20/2014 0620   HGB 13.3* 06/16/2015   HCT 40* 06/16/2015   PLT 230 06/16/2015   MCV 87.6 08/20/2014 0620   LYMPHSABS 1.6 08/18/2014 2026   MONOABS 0.5 08/18/2014 2026   EOSABS 0.0 08/18/2014 2026   BASOSABS 0.0 08/18/2014 2026    CMP     Component Value Date/Time   NA 142 06/16/2015   NA 135 08/21/2014 0623   K 3.7 06/16/2015   CL 101 08/21/2014 0623   CO2 21* 08/21/2014 0623   GLUCOSE 139* 08/21/2014 0623   BUN 14 06/16/2015   BUN 9 08/21/2014 0623   CREATININE 0.8 06/16/2015   CREATININE 0.90 08/21/2014 0623   CALCIUM 8.8*  08/21/2014 0623   PROT 7.1 08/18/2014 2026   ALBUMIN 3.4* 08/18/2014 2026   AST 26 06/16/2015   ALT 27 06/16/2015   ALKPHOS 74 06/16/2015   BILITOT 0.9 08/18/2014 2026   GFRNONAA >60 08/21/2014 0623   GFRAA >60 08/21/2014 0623    Assessment and Plan  Essential hypertension Mostly  Controlled with occ higher reading as today; pt  on lower dose metoprolol 12.5 mg  BID, 2/2 bradycardia, lisinopril 40 mg, HCTZ 12.5 and  norvasc to 10 mgdaily; will cont to monitor  Atrial fibrillation Chronic and stable;continues controlled on lower dose metoprolol metoprolol 12.5 mg BID ; ASA 325 as prophylaxis  Hx of  ischemic stroke Pt now speaks very well, with L side weakness, he prefers to remain in bed; pt on ASA as prophylaxis and on statin    Noah Delaine. Sheppard Coil, MD

## 2015-08-26 DIAGNOSIS — M79671 Pain in right foot: Secondary | ICD-10-CM | POA: Diagnosis not present

## 2015-08-26 DIAGNOSIS — M79672 Pain in left foot: Secondary | ICD-10-CM | POA: Diagnosis not present

## 2015-08-26 DIAGNOSIS — B351 Tinea unguium: Secondary | ICD-10-CM | POA: Diagnosis not present

## 2015-08-26 DIAGNOSIS — E119 Type 2 diabetes mellitus without complications: Secondary | ICD-10-CM | POA: Diagnosis not present

## 2015-08-26 LAB — HM DIABETES FOOT EXAM

## 2015-08-27 DIAGNOSIS — F329 Major depressive disorder, single episode, unspecified: Secondary | ICD-10-CM | POA: Diagnosis not present

## 2015-09-22 DIAGNOSIS — Z993 Dependence on wheelchair: Secondary | ICD-10-CM | POA: Diagnosis not present

## 2015-09-22 DIAGNOSIS — I69354 Hemiplegia and hemiparesis following cerebral infarction affecting left non-dominant side: Secondary | ICD-10-CM | POA: Diagnosis not present

## 2015-09-22 DIAGNOSIS — M6281 Muscle weakness (generalized): Secondary | ICD-10-CM | POA: Diagnosis not present

## 2015-09-23 DIAGNOSIS — Z993 Dependence on wheelchair: Secondary | ICD-10-CM | POA: Diagnosis not present

## 2015-09-23 DIAGNOSIS — M6281 Muscle weakness (generalized): Secondary | ICD-10-CM | POA: Diagnosis not present

## 2015-09-23 DIAGNOSIS — I69354 Hemiplegia and hemiparesis following cerebral infarction affecting left non-dominant side: Secondary | ICD-10-CM | POA: Diagnosis not present

## 2015-09-24 DIAGNOSIS — M6281 Muscle weakness (generalized): Secondary | ICD-10-CM | POA: Diagnosis not present

## 2015-09-24 DIAGNOSIS — Z993 Dependence on wheelchair: Secondary | ICD-10-CM | POA: Diagnosis not present

## 2015-09-24 DIAGNOSIS — I69354 Hemiplegia and hemiparesis following cerebral infarction affecting left non-dominant side: Secondary | ICD-10-CM | POA: Diagnosis not present

## 2015-09-25 DIAGNOSIS — Z993 Dependence on wheelchair: Secondary | ICD-10-CM | POA: Diagnosis not present

## 2015-09-25 DIAGNOSIS — I69354 Hemiplegia and hemiparesis following cerebral infarction affecting left non-dominant side: Secondary | ICD-10-CM | POA: Diagnosis not present

## 2015-09-25 DIAGNOSIS — M6281 Muscle weakness (generalized): Secondary | ICD-10-CM | POA: Diagnosis not present

## 2015-09-28 DIAGNOSIS — I69354 Hemiplegia and hemiparesis following cerebral infarction affecting left non-dominant side: Secondary | ICD-10-CM | POA: Diagnosis not present

## 2015-09-28 DIAGNOSIS — M6281 Muscle weakness (generalized): Secondary | ICD-10-CM | POA: Diagnosis not present

## 2015-09-28 DIAGNOSIS — Z993 Dependence on wheelchair: Secondary | ICD-10-CM | POA: Diagnosis not present

## 2015-09-29 DIAGNOSIS — I69354 Hemiplegia and hemiparesis following cerebral infarction affecting left non-dominant side: Secondary | ICD-10-CM | POA: Diagnosis not present

## 2015-09-29 DIAGNOSIS — Z993 Dependence on wheelchair: Secondary | ICD-10-CM | POA: Diagnosis not present

## 2015-09-29 DIAGNOSIS — M6281 Muscle weakness (generalized): Secondary | ICD-10-CM | POA: Diagnosis not present

## 2015-09-30 DIAGNOSIS — M6281 Muscle weakness (generalized): Secondary | ICD-10-CM | POA: Diagnosis not present

## 2015-09-30 DIAGNOSIS — Z993 Dependence on wheelchair: Secondary | ICD-10-CM | POA: Diagnosis not present

## 2015-09-30 DIAGNOSIS — I69354 Hemiplegia and hemiparesis following cerebral infarction affecting left non-dominant side: Secondary | ICD-10-CM | POA: Diagnosis not present

## 2015-10-01 ENCOUNTER — Encounter: Payer: Self-pay | Admitting: Internal Medicine

## 2015-10-01 ENCOUNTER — Non-Acute Institutional Stay (SKILLED_NURSING_FACILITY): Payer: Medicare Other | Admitting: Internal Medicine

## 2015-10-01 DIAGNOSIS — I482 Chronic atrial fibrillation, unspecified: Secondary | ICD-10-CM

## 2015-10-01 DIAGNOSIS — M25561 Pain in right knee: Secondary | ICD-10-CM

## 2015-10-01 DIAGNOSIS — I69354 Hemiplegia and hemiparesis following cerebral infarction affecting left non-dominant side: Secondary | ICD-10-CM | POA: Diagnosis not present

## 2015-10-01 DIAGNOSIS — M6281 Muscle weakness (generalized): Secondary | ICD-10-CM | POA: Diagnosis not present

## 2015-10-01 DIAGNOSIS — I6930 Unspecified sequelae of cerebral infarction: Secondary | ICD-10-CM

## 2015-10-01 DIAGNOSIS — Z993 Dependence on wheelchair: Secondary | ICD-10-CM | POA: Diagnosis not present

## 2015-10-01 DIAGNOSIS — I10 Essential (primary) hypertension: Secondary | ICD-10-CM | POA: Diagnosis not present

## 2015-10-01 DIAGNOSIS — E118 Type 2 diabetes mellitus with unspecified complications: Secondary | ICD-10-CM

## 2015-10-01 DIAGNOSIS — E785 Hyperlipidemia, unspecified: Secondary | ICD-10-CM

## 2015-10-01 NOTE — Progress Notes (Signed)
Location:   [facility] Room Number: [address]W Place of Service:  SNF [phone]) Provider:  Maia Breslow, MD  Patient Care Team: Biagio Borg, MD as PCP - General  Extended Emergency Contact Information Primary Emergency Contact: Barnett,Margie A. Address: [place], [place] Phone: [phone] Work Phone: [phone] Relation: Spouse Secondary Emergency Contact: [place] of [place] Phone: [phone] Relation: Son  Code Status:  Full Code Goals of care: Advanced Directive information Advanced Directives 10/01/2015  Does patient have an advance directive? Yes  Type of Advance Directive (No Data)  Does patient want to make changes to advanced directive? No - Patient declined  Copy of advanced directive(s) in chart? Yes  Would patient like information on creating an advanced directive? -     Chief Complaint  Patient presents with  . Medical Management of Chronic Issues    Routine visit  For her medical management of chronic medical conditions including hypertension-atrial fibrillation-history of CVA-history of knee pain-history of gout-hyperlipidemia-diabetes type 2-  HPI:  Pt is a 65 y.o. male seen today for medical management of chronic diseases. As noted above-he has enjoyed a period of relative stability-most acute issue recently that I have seen him for was complaints of knee pain more so on the right-x-rays were negative for any acute process  of the knees--venous Doppler of the leg was negative for any DVT.  He is not complaining any further of pain in this regards. He is now on naproxen twice a day as well as tramadol as needed every 6 hours and this appears to be effective.  He does have a history of atrial fibrillation this appears rate controlled he is on Lopressor 12.5 mg twice a day-with orders to hold this for pulse less than 60-I do note occasionally he will have  bradycardic readings but is asymptomatic.  Pulse on exam today was in the high 50s.  He does have a history of hypertension he does not appear to have consistently elevated systolic pressures most recently been 111/70-135/79-108/50-today 156/89 but this appears to be fairly unusual In addition to Lopressor 12.5 mg twice a day he T is on hydrochlorothiazide as well as lisinopril 40 mg a dayand  Norvasc 5 mg a day--- -  he also has a history of type 2 diabetes he is on Lantus 10 units a day hemoglobin A1c was 5.6 back on 06/16/2015 CBGs have been well controlled will update a hemoglobin A1c  Past Medical History:  Diagnosis Date  . Cataract   . Diabetes mellitus    type II  . DIABETES MELLITUS, TYPE II 07/27/2007  . Gout   . GOUT 12/01/2006  . Hx of colonic polyps 05/30/2008  . Hyperlipidemia   . HYPERLIPIDEMIA 01/19/2007  . Hypertension   . HYPERTENSION 12/01/2006  . Obesity   . OBESITY, HX OF 12/01/2006  . Paroxysmal atrial fibrillation (HCC)    postoperatively  . POSTURAL LIGHTHEADEDNESS 02/11/2010   Past Surgical History:  Procedure Laterality Date  . CATARACT EXTRACTION     bilateral  . CHOLECYSTECTOMY     Laparoscopic  . COLONOSCOPY    . TEE WITHOUT CARDIOVERSION N/A 08/20/2014   Procedure: TRANSESOPHAGEAL ECHOCARDIOGRAM (TEE);  Surgeon: Sueanne Margarita, MD;  Location: [facility] ENDOSCOPY;  Service: Cardiovascular;  Laterality: N/A;  PT NEED A LOOP    No Known Allergies    Medication List  Accurate as of 10/01/15  2:23 PM. Always use your most recent med list.          allopurinol 100 MG tablet Commonly known as:  ZYLOPRIM Take 1 tablet (100 mg total) by mouth daily.   AMLODIPINE BESYLATE PO Take 5 mg by mouth daily.   aspirin 81 MG chewable tablet Chew 81 mg by mouth daily.   atorvastatin 80 MG tablet Commonly known as:  LIPITOR Take 1 tablet (80 mg total) by mouth daily.   docusate sodium 100 MG capsule Commonly known as:  COLACE Take 100 mg by mouth 2  (two) times daily. Hold for loose stools   folic acid 1 MG tablet Commonly known as:  FOLVITE Take 1 mg by mouth daily.   hydrochlorothiazide 12.5 MG capsule Commonly known as:  MICROZIDE Take 12.5 mg by mouth daily.   insulin aspart 100 UNIT/ML injection Commonly known as:  novoLOG Three times a day prior to meals based on following sliding scale. CBG < 70: implement hypoglycemia protocol CBG 70 - 120: 0 units CBG 121 - 150: 2 units CBG 151 - 200: 3 units CBG 201 - 250: 5 units CBG 251 - 300: 8 units and call MD   insulin glargine 100 UNIT/ML injection Commonly known as:  LANTUS Inject 0.1 mLs (10 Units total) into the skin at bedtime.   lisinopril 40 MG tablet Commonly known as:  PRINIVIL,ZESTRIL Take 40 mg by mouth daily.   magnesium oxide 400 (241.3 Mg) MG tablet Commonly known as:  MAG-OX Take 1 tablet (400 mg total) by mouth 2 (two) times daily.   metoprolol tartrate 25 MG tablet Commonly known as:  LOPRESSOR Take 12.5 mg by mouth 2 (two) times daily. Hold for pulse less than 60   Naproxen Sodium 220 MG Caps Give 1 tablet by mouth twice a day (GIVE WITH TRAMADOL )   polyethylene glycol packet Commonly known as:  MIRALAX / GLYCOLAX Take 17 g by mouth daily.   potassium chloride SA 20 MEQ tablet Commonly known as:  K-DUR,KLOR-CON Take 20 mEq by mouth daily.   thiamine 100 MG tablet Commonly known as:  VITAMIN B-1 Take 100 mg by mouth daily.   traMADol 50 MG tablet Commonly known as:  ULTRAM Give 1 tablet by mouth twice a day. Give 1 tablet by mouth every 6 hours  for moderate pain PRN. Give two tablets by mouth every 6  Hours PRN  for severe pain.       Review of Systems   DATA OBTAINED: from patient,  GENERAL:  no fevers, fatigue, appetite changes SKIN: No itching, rash HEENT: No complaint RESPIRATORY: No cough, wheezing, SOB CARDIAC: No chest pain, palpitations, lower extremity edema  GI: No abdominal pain, No N/V/D or constipation, No heartburn or  reflux  GU: No dysuria, frequency or urgency, or incontinence  MUSCULOSKELETAL: No unrelieved bone/joint pain--Lower extremity pain right knee pain appears controlled  current medications NEUROLOGIC: No headache, dizziness  PSYCHIATRIC: No overt anxiety or sadness   Immunization History  Administered Date(s) Administered  . Influenza Split 02/25/2011  . Influenza Whole 03/07/1997, 01/19/2007, 01/24/2008, 01/19/2009, 01/19/2010  . Influenza-Unspecified 12/08/2014  . PPD Test 09/09/2014  . Pneumococcal Conjugate-13 12/04/2013  . Pneumococcal Polysaccharide-23 01/24/2008  . Td 03/07/1993, 01/24/2008  . Zoster 01/19/2010   Pertinent  Health Maintenance Due  Topic Date Due  . PNA vac Low Risk Adult (2 of 2 - PPSV23) 03/08/2023 (Originally 12/05/2014)  . INFLUENZA VACCINE  10/06/2015  . HEMOGLOBIN  A1C  12/16/2015  . OPHTHALMOLOGY EXAM  05/04/2016  . FOOT EXAM  05/27/2016  . COLONOSCOPY  02/13/2019   Fall Risk  12/04/2013  Falls in the past year? No   Functional Status Survey:    Vitals:   10/01/15 1331  BP: (!) 156/89  Pulse: 60  Resp: 19  Temp: 97.6 F (36.4 C)  TempSrc: Oral  Weight: 222 lb 6.4 oz (100.9 kg)  Height: 6\' 2"  (1.88 m)  Of note blood pressures are typically-111/70-135/79 Body mass index is 28.55 kg/m. Physical Exam GENERAL APPEARANCE: Alert, conversant, No acute distress comfortably in bed SKIN: No diaphoresis rash HEENT: Unremarkable pupils appear reactive light visual acuity grossly intact.  Oropharynx clear mucous membranes moist RESPIRATORY: Breathing is even, unlabored. Lung sounds are clear   CARDIOVASCULAR: Heart RRR no murmurs, rubs or gallops. No peripheral edema slight bradycardia pulse 56  GASTROINTESTINAL: Abdomen is soft, non-tender, not distended w/ normal bowel sounds.  GENITOURINARY: Bladder non tender, not distended  MUSCULOSKELETAL: No abnormal joints or musculature lower extremity weakness with some left-sided residual weakness  although this appears fairly minimal-is able to move all extremities 4 I do not note any deformities lower extremities some stiffness of lower extremities NEUROLOGIC: Cranial nerves 2-12 grossly intact; L side weakness PSYCHIATRIC: Mood and affect appropriate to situation, no behavioral issues  Labs reviewed:  Recent Labs  06/16/15  NA 142  K 3.7  BUN 14  CREATININE 0.8    Recent Labs  06/16/15  AST 26  ALT 27  ALKPHOS 74    Recent Labs  06/16/15  WBC 6.6  HGB 13.3*  HCT 40*  PLT 230   Lab Results  Component Value Date   TSH 1.34 06/16/2015   Lab Results  Component Value Date   HGBA1C 5.6 06/16/2015   Lab Results  Component Value Date   CHOL 206 (H) 08/20/2014   HDL 35 (L) 08/20/2014   LDLCALC 135 (H) 08/20/2014   LDLDIRECT 193.0 06/03/2014   TRIG 182 (H) 08/20/2014   CHOLHDL 5.9 08/20/2014    Significant Diagnostic Results in last 30 days:  No results found.  Assessment/Plan   #1 history of atrial fibrillation this is chronic and stable he is on low dose Lopressor 12.5 mg twice a day as well as aspirin 81 mg a day as prophylaxis occasional bradycardia which is asymptomatic with orders to hold Lopressor for pulse less than 60.  #2 history of ischemic stroke -with some mild left-sided weakness-she does well with supportive care again does continue on aspirin is also on a statin.  #3-hypertension this appears mostly controlled with occasional systolic elevations which did not be persistent continues on Lopressor 12.5 mg twice a day-lisinopril 40 mg daily hydrochlorothiazide 12.5 mg a day and Norvasc 5 mg a day.  #4-history of right knee pain again this appears to be well controlled on current medications including Naprosyn as well as tramadol when necessary.  #5 history of hyperlipidemia will update a fasting lipid panel he continues on a statin also will update liver function tests.  #6 history of diabetes type 2 again hemoglobin A1c appears to be stable  at 5.6 will update this he is on Lantus 10 units a day as well as sliding scale NovoLog insulin.  #7 hypokalemia he is on potassium supplementation Will update potassium level with a BMP-she was thought borderline low magnesium contributed to this he is on magnesium supplementation as well again will update a magnesium level.  #8 history of alcohol  abuse this has not really been an issue during his stay here he is on folic acid as well as vitamin.    #9 history of gout this is been stable for some time he is on allopurinol for prophylaxis.  Of note will update a CBC CMP and hemoglobin A1c magnesium level and fasting lipid panel.  [id number] note greater than 40 minutes spent assessing patient-reviewing his chart-reviewing his labs-discussing his status at bedside-and  coordinating and formulating a plan of carer for numerous diagnoses-of note greater than 50% of time spent coordinating plan of care

## 2015-10-02 DIAGNOSIS — Z993 Dependence on wheelchair: Secondary | ICD-10-CM | POA: Diagnosis not present

## 2015-10-02 DIAGNOSIS — M6281 Muscle weakness (generalized): Secondary | ICD-10-CM | POA: Diagnosis not present

## 2015-10-02 DIAGNOSIS — I69354 Hemiplegia and hemiparesis following cerebral infarction affecting left non-dominant side: Secondary | ICD-10-CM | POA: Diagnosis not present

## 2015-10-05 DIAGNOSIS — Z993 Dependence on wheelchair: Secondary | ICD-10-CM | POA: Diagnosis not present

## 2015-10-05 DIAGNOSIS — M6281 Muscle weakness (generalized): Secondary | ICD-10-CM | POA: Diagnosis not present

## 2015-10-05 DIAGNOSIS — I69354 Hemiplegia and hemiparesis following cerebral infarction affecting left non-dominant side: Secondary | ICD-10-CM | POA: Diagnosis not present

## 2015-10-06 DIAGNOSIS — Z79899 Other long term (current) drug therapy: Secondary | ICD-10-CM | POA: Diagnosis not present

## 2015-10-06 DIAGNOSIS — M6281 Muscle weakness (generalized): Secondary | ICD-10-CM | POA: Diagnosis not present

## 2015-10-06 DIAGNOSIS — I69354 Hemiplegia and hemiparesis following cerebral infarction affecting left non-dominant side: Secondary | ICD-10-CM | POA: Diagnosis not present

## 2015-10-07 DIAGNOSIS — I69354 Hemiplegia and hemiparesis following cerebral infarction affecting left non-dominant side: Secondary | ICD-10-CM | POA: Diagnosis not present

## 2015-10-07 DIAGNOSIS — M6281 Muscle weakness (generalized): Secondary | ICD-10-CM | POA: Diagnosis not present

## 2015-10-07 LAB — LIPID PANEL
Cholesterol: 104 mg/dL (ref 0–200)
HDL: 36 mg/dL (ref 35–70)
LDL Cholesterol: 43 mg/dL
Triglycerides: 127 mg/dL (ref 40–160)

## 2015-10-07 LAB — CBC AND DIFFERENTIAL
HCT: 41 % (ref 41–53)
HEMOGLOBIN: 13.9 g/dL (ref 13.5–17.5)
Platelets: 222 10*3/uL (ref 150–399)
WBC: 7.2 10^3/mL

## 2015-10-07 LAB — HEPATIC FUNCTION PANEL
ALT: 18 U/L (ref 10–40)
AST: 19 U/L (ref 14–40)
Alkaline Phosphatase: 77 U/L (ref 25–125)
BILIRUBIN, TOTAL: 0.4 mg/dL

## 2015-10-07 LAB — BASIC METABOLIC PANEL
BUN: 16 mg/dL (ref 4–21)
Creatinine: 0.9 mg/dL (ref 0.6–1.3)
GLUCOSE: 122 mg/dL
POTASSIUM: 3.9 mmol/L (ref 3.4–5.3)
SODIUM: 141 mmol/L (ref 137–147)

## 2015-10-07 LAB — HEMOGLOBIN A1C: Hemoglobin A1C: 5.4

## 2015-10-08 DIAGNOSIS — M6281 Muscle weakness (generalized): Secondary | ICD-10-CM | POA: Diagnosis not present

## 2015-10-08 DIAGNOSIS — I69354 Hemiplegia and hemiparesis following cerebral infarction affecting left non-dominant side: Secondary | ICD-10-CM | POA: Diagnosis not present

## 2015-10-09 DIAGNOSIS — M6281 Muscle weakness (generalized): Secondary | ICD-10-CM | POA: Diagnosis not present

## 2015-10-09 DIAGNOSIS — I69354 Hemiplegia and hemiparesis following cerebral infarction affecting left non-dominant side: Secondary | ICD-10-CM | POA: Diagnosis not present

## 2015-10-10 DIAGNOSIS — M6281 Muscle weakness (generalized): Secondary | ICD-10-CM | POA: Diagnosis not present

## 2015-10-10 DIAGNOSIS — I69354 Hemiplegia and hemiparesis following cerebral infarction affecting left non-dominant side: Secondary | ICD-10-CM | POA: Diagnosis not present

## 2015-10-12 DIAGNOSIS — M6281 Muscle weakness (generalized): Secondary | ICD-10-CM | POA: Diagnosis not present

## 2015-10-12 DIAGNOSIS — I69354 Hemiplegia and hemiparesis following cerebral infarction affecting left non-dominant side: Secondary | ICD-10-CM | POA: Diagnosis not present

## 2015-10-13 DIAGNOSIS — I69354 Hemiplegia and hemiparesis following cerebral infarction affecting left non-dominant side: Secondary | ICD-10-CM | POA: Diagnosis not present

## 2015-10-13 DIAGNOSIS — M6281 Muscle weakness (generalized): Secondary | ICD-10-CM | POA: Diagnosis not present

## 2015-10-14 DIAGNOSIS — M6281 Muscle weakness (generalized): Secondary | ICD-10-CM | POA: Diagnosis not present

## 2015-10-14 DIAGNOSIS — I69354 Hemiplegia and hemiparesis following cerebral infarction affecting left non-dominant side: Secondary | ICD-10-CM | POA: Diagnosis not present

## 2015-11-02 ENCOUNTER — Encounter: Payer: Self-pay | Admitting: Internal Medicine

## 2015-11-02 ENCOUNTER — Non-Acute Institutional Stay (SKILLED_NURSING_FACILITY): Payer: Medicare Other | Admitting: Internal Medicine

## 2015-11-02 DIAGNOSIS — I482 Chronic atrial fibrillation, unspecified: Secondary | ICD-10-CM

## 2015-11-02 DIAGNOSIS — I10 Essential (primary) hypertension: Secondary | ICD-10-CM

## 2015-11-02 DIAGNOSIS — E785 Hyperlipidemia, unspecified: Secondary | ICD-10-CM

## 2015-11-02 NOTE — Progress Notes (Signed)
MRN: [id number] Name: Juan [name]  Sex: male Age: 65 y.o. DOB: April 21, 1950  [place] #:  Facility/Room: [name] / [address] Level Of Care: SNF Provider: Noah Delaine. Sheppard Coil, MD Emergency Contacts: Extended Emergency Contact Information Primary Emergency Contact: Anastasi,Margie A. Address: [place], [place] Phone: [phone] Work Phone: [phone] Relation: Spouse Secondary Emergency Contact: [place] of [place] Phone: [phone] Relation: Son  Code Status: Full Code  Allergies: Review of patient's allergies indicates no known allergies.  Chief Complaint  Patient presents with  . Medical Management of Chronic Issues    Routine Visit    HPI: Patient is 65 y.o. male who is being seen for routine issues of HTN, HLD and AF.   Past Medical History:  Diagnosis Date  . Cataract   . Diabetes mellitus    type II  . DIABETES MELLITUS, TYPE II 07/27/2007  . Gout   . GOUT 12/01/2006  . Hx of colonic polyps 05/30/2008  . Hyperlipidemia   . HYPERLIPIDEMIA 01/19/2007  . Hypertension   . HYPERTENSION 12/01/2006  . Obesity   . OBESITY, HX OF 12/01/2006  . Paroxysmal atrial fibrillation (HCC)    postoperatively  . POSTURAL LIGHTHEADEDNESS 02/11/2010    Past Surgical History:  Procedure Laterality Date  . CATARACT EXTRACTION     bilateral  . CHOLECYSTECTOMY     Laparoscopic  . COLONOSCOPY    . TEE WITHOUT CARDIOVERSION N/A 08/20/2014   Procedure: TRANSESOPHAGEAL ECHOCARDIOGRAM (TEE);  Surgeon: Sueanne Margarita, MD;  Location: [facility] ENDOSCOPY;  Service: Cardiovascular;  Laterality: N/A;  PT NEED A LOOP      Medication List       Accurate as of 11/02/15  7:10 PM. Always use your most recent med list.          allopurinol 100 MG tablet Commonly known as:  ZYLOPRIM Take 1 tablet (100 mg total) by mouth daily.   AMLODIPINE BESYLATE PO Take 5 mg by mouth daily.   aspirin 81 MG chewable  tablet Chew 81 mg by mouth daily.   atorvastatin 80 MG tablet Commonly known as:  LIPITOR Take 1 tablet (80 mg total) by mouth daily.   docusate sodium 100 MG capsule Commonly known as:  COLACE Take 100 mg by mouth 2 (two) times daily. Hold for loose stools   folic acid 1 MG tablet Commonly known as:  FOLVITE Take 1 mg by mouth daily.   hydrochlorothiazide 12.5 MG capsule Commonly known as:  MICROZIDE Take 12.5 mg by mouth daily.   insulin aspart 100 UNIT/ML injection Commonly known as:  novoLOG Three times a day prior to meals based on following sliding scale. CBG < 70: implement hypoglycemia protocol CBG 70 - 120: 0 units CBG 121 - 150: 2 units CBG 151 - 200: 3 units CBG 201 - 250: 5 units CBG 251 - 300: 8 units and call MD   insulin glargine 100 UNIT/ML injection Commonly known as:  LANTUS Inject 0.1 mLs (10 Units total) into the skin at bedtime.   lisinopril 40 MG tablet Commonly known as:  PRINIVIL,ZESTRIL Take 40 mg by mouth daily.   magnesium oxide 400 (241.3 Mg) MG tablet Commonly known as:  MAG-OX Take 1 tablet (400 mg total) by mouth 2 (two) times daily.   metoprolol tartrate 25 MG tablet Commonly known as:  LOPRESSOR Take 12.5 mg by mouth 2 (two) times daily. Hold  for pulse less than 60   Naproxen Sodium 220 MG Caps Give 1 tablet by mouth twice a day (GIVE WITH TRAMADOL )   polyethylene glycol packet Commonly known as:  MIRALAX / GLYCOLAX Take 17 g by mouth daily.   potassium chloride SA 20 MEQ tablet Commonly known as:  K-DUR,KLOR-CON Take 20 mEq by mouth daily.   thiamine 100 MG tablet Commonly known as:  VITAMIN B-1 Take 100 mg by mouth daily.   traMADol 50 MG tablet Commonly known as:  ULTRAM Give 1 tablet by mouth twice a day. Give 1 tablet by mouth every 6 hours  for moderate pain PRN. Give two tablets by mouth every 6  Hours PRN  for severe pain.       No orders of the defined types were placed in this encounter.   Immunization History   Administered Date(s) Administered  . Influenza Split 02/25/2011  . Influenza Whole 03/07/1997, 01/19/2007, 01/24/2008, 01/19/2009, 01/19/2010  . Influenza-Unspecified 12/08/2014  . PPD Test 09/09/2014  . Pneumococcal Conjugate-13 12/04/2013  . Pneumococcal Polysaccharide-23 01/24/2008  . Td 03/07/1993, 01/24/2008  . Zoster 01/19/2010    Social History  Substance Use Topics  . Smoking status: Current Every Day Smoker    Types: Pipe  . Smokeless tobacco: Never Used     Comment: occasional  . Alcohol use 0.0 oz/week     Comment: 2 drinks a day-beer    Review of Systems  DATA OBTAINED: from patient GENERAL:  no fevers, fatigue, appetite changes; mises his wife SKIN: No itching, rash HEENT: No complaint RESPIRATORY: No cough, wheezing, SOB CARDIAC: No chest pain, palpitations, lower extremity edema  GI: No abdominal pain, No N/V/D or constipation, No heartburn or reflux  GU: No dysuria, frequency or urgency, or incontinence  MUSCULOSKELETAL: No unrelieved bone/joint pain NEUROLOGIC: No headache, dizziness  PSYCHIATRIC: No overt anxiety or sadness  Vitals:   11/02/15 0825  BP: 123/74  Pulse: (!) 57  Resp: 20  Temp: 97.5 F (36.4 C)    Physical Exam  GENERAL APPEARANCE: Alert, conversant, No acute distress  SKIN: No diaphoresis rash HEENT: Unremarkable RESPIRATORY: Breathing is even, unlabored. Lung sounds are clear   CARDIOVASCULAR: Heart RRR no murmurs, rubs or gallops. No peripheral edema  GASTROINTESTINAL: Abdomen is soft, non-tender, not distended w/ normal bowel sounds.  GENITOURINARY: Bladder non tender, not distended  MUSCULOSKELETAL: No abnormal joints or musculature NEUROLOGIC: Cranial nerves 2-12 grossly intact; L side weakness PSYCHIATRIC: Mood and affect appropriate to situation, no behavioral issues  Patient Active Problem List   Diagnosis Date Noted  . Knee pain 07/06/2015  . Depression 07/05/2015  . Bradycardia 07/05/2015  . History of CVA  (cerebrovascular accident) 06/11/2015  . Diabetes mellitus type 2, controlled, with complications ([place]) 123XX123  . Left groin pain 04/09/2015  . Pain in joint, lower leg 11/13/2014  . Hypokalemia 08/22/2014  . Alcohol use ([place]) 08/22/2014  . NSVT (nonsustained ventricular tachycardia) ([place])   . History of CVA with residual deficit 08/18/2014  . Increased prostate specific antigen (PSA) velocity 02/25/2011  . Preventative health care 07/18/2010  . Atrial fibrillation ([place]) 03/09/2010  . 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 Hx of colonic polyps 05/30/2008  . Diabetes mellitus type 2, uncontrolled, with complications ([place]) 123456  . Hyperlipidemia 01/19/2007  . GOUT 12/01/2006  . Essential hypertension 12/01/2006  . OBESITY, HX OF 12/01/2006    CBC    Component Value Date/Time   WBC 7.2 10/07/2015   WBC 9.7 08/20/2014 0620  RBC 5.02 08/20/2014 0620   HGB 13.9 10/07/2015   HCT 41 10/07/2015   PLT 222 10/07/2015   MCV 87.6 08/20/2014 0620   LYMPHSABS 1.6 08/18/2014 2026   MONOABS 0.5 08/18/2014 2026   EOSABS 0.0 08/18/2014 2026   BASOSABS 0.0 08/18/2014 2026    CMP     Component Value Date/Time   NA 141 10/07/2015   K 3.9 10/07/2015   CL 101 08/21/2014 0623   CO2 21 (L) 08/21/2014 0623   GLUCOSE 139 (H) 08/21/2014 0623   BUN 16 10/07/2015   CREATININE 0.9 10/07/2015   CREATININE 0.90 08/21/2014 0623   CALCIUM 8.8 (L) 08/21/2014 0623   PROT 7.1 08/18/2014 2026   ALBUMIN 3.4 (L) 08/18/2014 2026   AST 19 10/07/2015   ALT 18 10/07/2015   ALKPHOS 77 10/07/2015   BILITOT 0.9 08/18/2014 2026   GFRNONAA >60 08/21/2014 0623   GFRAA >60 08/21/2014 0623    Assessment and Plan  Hyperlipidemia In 10/2015 LDL43, HDL 36; good control on lipitor 90 mg daily; plan to cont current med  Atrial fibrillation Rate controlled on 12.5 mg BID and prophylaxed with ASA 81 mg daily  Essential hypertension Well controlled on metoprolol 12.5 mg BID, Lisinopril 40 mg daily and  HCTZ 12.5 mg daily;plan to cont current meds   Myrical Andujo D. Sheppard Coil, MD

## 2015-11-02 NOTE — Assessment & Plan Note (Signed)
Rate controlled on 12.5 mg BID and prophylaxed with ASA 81 mg daily

## 2015-11-02 NOTE — Assessment & Plan Note (Signed)
Well controlled on metoprolol 12.5 mg BID, Lisinopril 40 mg daily and HCTZ 12.5 mg daily;plan to cont current meds

## 2015-11-02 NOTE — Assessment & Plan Note (Signed)
In 10/2015 LDL43, HDL 36; good control on lipitor 90 mg daily; plan to cont current med

## 2015-11-05 DIAGNOSIS — E119 Type 2 diabetes mellitus without complications: Secondary | ICD-10-CM | POA: Diagnosis not present

## 2015-11-05 DIAGNOSIS — Z7901 Long term (current) use of anticoagulants: Secondary | ICD-10-CM | POA: Diagnosis not present

## 2015-11-06 LAB — PSA: PSA: 1.7

## 2015-11-19 ENCOUNTER — Encounter: Payer: Self-pay | Admitting: Internal Medicine

## 2015-11-19 NOTE — Progress Notes (Signed)
Location:  [place] Room Number: [phone] Place of Service:  SNF [id number]) Noah Delaine. Sheppard Coil, MD  PCP: Cathlean Cower, MD Patient Care Team: Biagio Borg, MD as PCP - General  Extended Emergency Contact Information Primary Emergency Contact: Failla,Margie A. Address: [place], [place] Phone: [phone] Work Phone: [phone] Relation: Spouse Secondary Emergency Contact: [place] of [place] Phone: [phone] Relation: Son  No Known Allergies  Chief Complaint  Patient presents with  . Discharge Note    Discharged from SNF    HPI:  65 y.o. male      Past Medical History:  Diagnosis Date  . Cataract   . Diabetes mellitus    type II  . DIABETES MELLITUS, TYPE II 07/27/2007  . Gout   . GOUT 12/01/2006  . Hx of colonic polyps 05/30/2008  . Hyperlipidemia   . HYPERLIPIDEMIA 01/19/2007  . Hypertension   . HYPERTENSION 12/01/2006  . Obesity   . OBESITY, HX OF 12/01/2006  . Paroxysmal atrial fibrillation (HCC)    postoperatively  . POSTURAL LIGHTHEADEDNESS 02/11/2010    Past Surgical History:  Procedure Laterality Date  . CATARACT EXTRACTION     bilateral  . CHOLECYSTECTOMY     Laparoscopic  . COLONOSCOPY    . TEE WITHOUT CARDIOVERSION N/A 08/20/2014   Procedure: TRANSESOPHAGEAL ECHOCARDIOGRAM (TEE);  Surgeon: Sueanne Margarita, MD;  Location: [facility] ENDOSCOPY;  Service: Cardiovascular;  Laterality: N/A;  PT NEED A LOOP     reports that he has been smoking Pipe.  He has never used smokeless tobacco. He reports that he drinks alcohol. He reports that he does not use drugs. Social History   Social History  . Marital status: Married    Spouse name: N/A  . Number of children: 2  . Years of education: N/A   Occupational History  . office furniture sales, flooring business as Adult nurse   Social History Main Topics  . Smoking status: Current  Every Day Smoker    Types: Pipe  . Smokeless tobacco: Never Used     Comment: occasional  . Alcohol use 0.0 oz/week     Comment: 2 drinks a day-beer  . Drug use: No     Comment: 2-3 beer/day  . Sexual activity: Not on file   Other Topics Concern  . Not on file   Social History Narrative  . No narrative on file    Pertinent  Health Maintenance Due  Topic Date Due  . INFLUENZA VACCINE  10/06/2015  . PNA vac Low Risk Adult (2 of 2 - PPSV23) 03/08/2023 (Originally 12/05/2014)  . HEMOGLOBIN A1C  04/08/2016  . OPHTHALMOLOGY EXAM  05/04/2016  . FOOT EXAM  08/25/2016  . COLONOSCOPY  02/13/2019    Medications:   Medication List       Accurate as of 11/19/15  2:13 PM. Always use your most recent med list.          allopurinol 100 MG tablet Commonly known as:  ZYLOPRIM Take 1 tablet (100 mg total) by mouth daily.   AMLODIPINE BESYLATE PO Take 5 mg by mouth daily.   aspirin 81 MG chewable tablet Chew 81 mg by mouth daily.   atorvastatin 80 MG tablet Commonly known as:  LIPITOR Take 1 tablet (80 mg total) by mouth daily.   docusate sodium 100 MG capsule Commonly known as:  COLACE Take 100 mg by mouth 2 (two) times daily. Hold for loose stools   folic acid 1 MG tablet Commonly known as:  FOLVITE Take 1 mg by mouth daily.   hydrochlorothiazide 12.5 MG capsule Commonly known as:  MICROZIDE Take 12.5 mg by mouth daily.   insulin aspart 100 UNIT/ML injection Commonly known as:  novoLOG Three times a day prior to meals based on following sliding scale. CBG < 70: implement hypoglycemia protocol CBG 70 - 120: 0 units CBG 121 - 150: 2 units CBG 151 - 200: 3 units CBG 201 - 250: 5 units CBG 251 - 300: 8 units and call MD   insulin glargine 100 UNIT/ML injection Commonly known as:  LANTUS Inject 0.1 mLs (10 Units total) into the skin at bedtime.   lisinopril 40 MG tablet Commonly known as:  PRINIVIL,ZESTRIL Take 40 mg by mouth daily.   magnesium oxide 400 (241.3 Mg)  MG tablet Commonly known as:  MAG-OX Take 1 tablet (400 mg total) by mouth 2 (two) times daily.   metoprolol tartrate 25 MG tablet Commonly known as:  LOPRESSOR Take 12.5 mg by mouth 2 (two) times daily. Hold for pulse less than 60   Naproxen Sodium 220 MG Caps Give 1 tablet by mouth twice a day (GIVE WITH TRAMADOL )   polyethylene glycol packet Commonly known as:  MIRALAX / GLYCOLAX Take 17 g by mouth daily.   potassium chloride SA 20 MEQ tablet Commonly known as:  K-DUR,KLOR-CON Take 20 mEq by mouth daily.   thiamine 100 MG tablet Commonly known as:  VITAMIN B-1 Take 100 mg by mouth daily.   traMADol 50 MG tablet Commonly known as:  ULTRAM Give 1 tablet by mouth twice a day. Give 1 tablet  by mouth every 6 hours  for moderate pain PRN. Give two tablets by mouth every 6  Hours PRN  for severe pain.        Vitals:   11/19/15 1357  BP: 123/74  Pulse: 62  Resp: 18  Temp: 97.5 F (36.4 C)  Weight: 218 lb 6.4 oz (99.1 kg)  Height: 6\' 2"  (1.88 m)   Body mass index is 28.04 kg/m.  Physical Exam  GENERAL APPEARANCE: Alert, conversant. No acute distress.  HEENT: Unremarkable. RESPIRATORY: Breathing is even, unlabored. Lung sounds are clear   CARDIOVASCULAR: Heart RRR no murmurs, rubs or gallops. No peripheral edema.  GASTROINTESTINAL: Abdomen is soft, non-tender, not distended w/ normal bowel sounds.  NEUROLOGIC: Cranial nerves 2-12 grossly intact. Moves all extremities   Labs reviewed: Basic Metabolic Panel:  Recent Labs  06/16/15 10/07/15  NA 142 141  K 3.7 3.9  BUN 14 16  CREATININE 0.8 0.9   Lab Results  Component Value Date   MICROALBUR 9.2 (H) 12/04/2013   Liver Function Tests:  Recent Labs  06/16/15 10/07/15  AST 26 19  ALT 27 18  ALKPHOS 74 77   No results for input(s): LIPASE, AMYLASE in the last 8760 hours. No results for input(s): AMMONIA in the last 8760 hours. CBC:  Recent Labs  06/16/15 10/07/15  WBC 6.6 7.2  HGB 13.3* 13.9    HCT 40* 41  PLT 230 222   Lipid  Recent Labs  10/07/15  CHOL 104  HDL 36  LDLCALC 43  TRIG 127   Cardiac Enzymes: No results for input(s): CKTOTAL, CKMB, CKMBINDEX, TROPONINI in the last 8760 hours. BNP: No results for input(s): BNP in the last 8760 hours. CBG: No results for  input(s): GLUCAP in the last 8760 hours.  Procedures and Imaging Studies During Stay: No results found.  Assessment/Plan:   No diagnosis found.   Patient is being discharged with the following home health services:    Patient is being discharged with the following durable medical equipment:    Patient has been advised to f/u with their PCP in 1-2 weeks to bring them up to date on their rehab stay.  Social services at facility was responsible for arranging this appointment.  Pt was provided with a 30 day supply of prescriptions for medications and refills must be obtained from their PCP.  For controlled substances, a more limited supply may be provided adequate until PCP appointment only.  Future labs/tests needed:   Noah Delaine. Sheppard Coil, MD   This encounter was created in error - please disregard.

## 2015-11-20 ENCOUNTER — Encounter: Payer: Self-pay | Admitting: Internal Medicine

## 2015-11-20 ENCOUNTER — Non-Acute Institutional Stay (SKILLED_NURSING_FACILITY): Payer: Medicare Other | Admitting: Internal Medicine

## 2015-11-20 DIAGNOSIS — Z860100 Personal history of colon polyps, unspecified: Secondary | ICD-10-CM

## 2015-11-20 DIAGNOSIS — I6930 Unspecified sequelae of cerebral infarction: Secondary | ICD-10-CM | POA: Diagnosis not present

## 2015-11-20 DIAGNOSIS — E118 Type 2 diabetes mellitus with unspecified complications: Secondary | ICD-10-CM | POA: Diagnosis not present

## 2015-11-20 DIAGNOSIS — E785 Hyperlipidemia, unspecified: Secondary | ICD-10-CM

## 2015-11-20 DIAGNOSIS — I6789 Other cerebrovascular disease: Secondary | ICD-10-CM | POA: Diagnosis not present

## 2015-11-20 DIAGNOSIS — I4729 Other ventricular tachycardia: Secondary | ICD-10-CM

## 2015-11-20 DIAGNOSIS — I10 Essential (primary) hypertension: Secondary | ICD-10-CM | POA: Diagnosis not present

## 2015-11-20 DIAGNOSIS — F32A Depression, unspecified: Secondary | ICD-10-CM

## 2015-11-20 DIAGNOSIS — I482 Chronic atrial fibrillation, unspecified: Secondary | ICD-10-CM

## 2015-11-20 DIAGNOSIS — I472 Ventricular tachycardia: Secondary | ICD-10-CM

## 2015-11-20 DIAGNOSIS — F329 Major depressive disorder, single episode, unspecified: Secondary | ICD-10-CM

## 2015-11-20 DIAGNOSIS — E119 Type 2 diabetes mellitus without complications: Secondary | ICD-10-CM | POA: Diagnosis not present

## 2015-11-20 NOTE — Progress Notes (Signed)
Location:  [place] Room Number: [phone] Place of Service:  SNF ([phone])  PCP: Cathlean Cower, MD Patient Care Team: Biagio Borg, MD as PCP - General  Extended Emergency Contact Information Primary Emergency Contact: Gilmer,Margie A. Address: [place], [place] Phone: [phone] Work Phone: [phone] Relation: Spouse Secondary Emergency Contact: [place] of [place] Phone: [phone] Relation: Son  No Known Allergies  No chief complaint on file.   HPI:  65 y.o. male with  hypertension, diabetes mellitus, paroxysmal fibrillation, hypertension, hyperlipidemia who was admitted to [facility] 08/21/2014 after a R ACA stroke with L sided weakness. Pt has been at SNF for residential care and is now being d/c to [facility] where is wife is planned to be admitted also.    Past Medical History:  Diagnosis Date  . Cataract   . Diabetes mellitus    type II  . DIABETES MELLITUS, TYPE II 07/27/2007  . Gout   . GOUT 12/01/2006  . Hx of colonic polyps 05/30/2008  . Hyperlipidemia   . HYPERLIPIDEMIA 01/19/2007  . Hypertension   . HYPERTENSION 12/01/2006  . Obesity   . OBESITY, HX OF 12/01/2006  . Paroxysmal atrial fibrillation (HCC)    postoperatively  . POSTURAL LIGHTHEADEDNESS 02/11/2010    Past Surgical History:  Procedure Laterality Date  . CATARACT EXTRACTION     bilateral  . CHOLECYSTECTOMY     Laparoscopic  . COLONOSCOPY    . TEE WITHOUT CARDIOVERSION N/A 08/20/2014   Procedure: TRANSESOPHAGEAL ECHOCARDIOGRAM (TEE);  Surgeon: Sueanne Margarita, MD;  Location: [facility] ENDOSCOPY;  Service: Cardiovascular;  Laterality: N/A;  PT NEED A LOOP     reports that he has been smoking Pipe.  He has never used smokeless tobacco. He reports that he drinks alcohol. He reports that he does not use drugs. Social History   Social History  . Marital status: Married    Spouse name: N/A  .  Number of children: 2  . Years of education: N/A   Occupational History  . office furniture sales, flooring business as Adult nurse   Social History Main Topics  . Smoking status: Current Every Day Smoker    Types: Pipe  . Smokeless tobacco: Never Used     Comment: occasional  . Alcohol use 0.0 oz/week     Comment: 2 drinks a day-beer  . Drug use: No     Comment: 2-3 beer/day  . Sexual activity: Not on file   Other Topics Concern  . Not on file   Social History Narrative  . No narrative on file    Pertinent  Health Maintenance Due  Topic Date Due  . INFLUENZA VACCINE  10/06/2015  . PNA vac Low Risk Adult (2 of 2 - PPSV23) 03/08/2023 (Originally 12/05/2014)  . HEMOGLOBIN A1C  04/08/2016  . OPHTHALMOLOGY EXAM  05/04/2016  . FOOT EXAM  08/25/2016  . COLONOSCOPY  02/13/2019    Medications:   Medication List       Accurate as of 11/20/15 10:31 AM. Always use your most recent med list.          allopurinol 100 MG tablet Commonly known as:  ZYLOPRIM Take 1 tablet (100 mg total) by mouth daily.   AMLODIPINE BESYLATE PO Take 5 mg by mouth daily.   aspirin 81 MG chewable tablet Chew 81 mg by mouth  daily.   atorvastatin 80 MG tablet Commonly known as:  LIPITOR Take 1 tablet (80 mg total) by mouth daily.   docusate sodium 100 MG capsule Commonly known as:  COLACE Take 100 mg by mouth 2 (two) times daily. Hold for loose stools   folic acid 1 MG tablet Commonly known as:  FOLVITE Take 1 mg by mouth daily.   hydrochlorothiazide 12.5 MG capsule Commonly known as:  MICROZIDE Take 12.5 mg by mouth daily.   insulin aspart 100 UNIT/ML injection Commonly known as:  novoLOG Three times a day prior to meals based on following sliding scale. CBG < 70: implement hypoglycemia protocol CBG 70 - 120: 0 units CBG 121 - 150: 2 units CBG 151 - 200: 3 units CBG 201 - 250: 5 units CBG 251 - 300: 8 units and call MD   insulin glargine 100 UNIT/ML  injection Commonly known as:  LANTUS Inject 0.1 mLs (10 Units total) into the skin at bedtime.   lisinopril 40 MG tablet Commonly known as:  PRINIVIL,ZESTRIL Take 40 mg by mouth daily.   magnesium oxide 400 (241.3 Mg) MG tablet Commonly known as:  MAG-OX Take 1 tablet (400 mg total) by mouth 2 (two) times daily.   metoprolol tartrate 25 MG tablet Commonly known as:  LOPRESSOR Take 12.5 mg by mouth 2 (two) times daily. Hold for pulse less than 60   Naproxen Sodium 220 MG Caps Give 1 tablet by mouth twice a day (GIVE WITH TRAMADOL )   polyethylene glycol packet Commonly known as:  MIRALAX / GLYCOLAX Take 17 g by mouth daily.   potassium chloride SA 20 MEQ tablet Commonly known as:  K-DUR,KLOR-CON Take 20 mEq by mouth daily.   thiamine 100 MG tablet Commonly known as:  VITAMIN B-1 Take 100 mg by mouth daily.   traMADol 50 MG tablet Commonly known as:  ULTRAM Give 1 tablet by mouth twice a day. Give 1 tablet  by mouth every 6 hours  for moderate pain PRN. Give two tablets by mouth every 6  Hours PRN  for severe pain.        Vitals:   11/20/15 0958  BP: 123/74  Pulse: 62  Resp: 18  Temp: 97.5 F (36.4 C)  Weight: 216 lb (98 kg)  Height: 6\' 2"  (1.88 m)   Body mass index is 27.73 kg/m.  Physical Exam  GENERAL APPEARANCE: Alert, conversant. No acute distress.  HEENT: Unremarkable. RESPIRATORY: Breathing is even, unlabored. Lung sounds are clear   CARDIOVASCULAR: Heart RRR no murmurs, rubs or gallops. No peripheral edema.  GASTROINTESTINAL: Abdomen is soft, non-tender, not distended w/ normal bowel sounds.  NEUROLOGIC: Cranial nerves 2-12 grossly intact; L side weakness.   Labs reviewed: Basic Metabolic Panel:  Recent Labs  06/16/15 10/07/15  NA 142 141  K 3.7 3.9  BUN 14 16  CREATININE 0.8 0.9   Lab Results  Component Value Date   MICROALBUR 9.2 (H) 12/04/2013   Liver Function Tests:  Recent Labs  06/16/15 10/07/15  AST 26 19  ALT 27 18   ALKPHOS 74 77   No results for input(s): LIPASE, AMYLASE in the last 8760 hours. No results for input(s): AMMONIA in the last 8760 hours. CBC:  Recent Labs  06/16/15 10/07/15  WBC 6.6 7.2  HGB 13.3* 13.9  HCT 40* 41  PLT 230 222   Lipid  Recent Labs  10/07/15  CHOL 104  HDL 36  LDLCALC 43  TRIG 127   Cardiac  Enzymes: No results for input(s): CKTOTAL, CKMB, CKMBINDEX, TROPONINI in the last 8760 hours. BNP: No results for input(s): BNP in the last 8760 hours. CBG: No results for input(s): GLUCAP in the last 8760 hours.  Procedures and Imaging Studies During Stay: No results found.  Assessment/Plan:      Pt is being transferred to [facility]. His medications have been reconciled and hard scripts written.    Time spent > 30 min;> 50% of time with patient was spent reviewing records, labs, tests and studies, counseling and developing plan of care  Inocencio Homes, MD

## 2015-11-21 DIAGNOSIS — I69354 Hemiplegia and hemiparesis following cerebral infarction affecting left non-dominant side: Secondary | ICD-10-CM | POA: Diagnosis not present

## 2015-11-21 DIAGNOSIS — R001 Bradycardia, unspecified: Secondary | ICD-10-CM | POA: Diagnosis not present

## 2015-11-21 DIAGNOSIS — I4891 Unspecified atrial fibrillation: Secondary | ICD-10-CM | POA: Diagnosis not present

## 2015-11-21 DIAGNOSIS — M6281 Muscle weakness (generalized): Secondary | ICD-10-CM | POA: Diagnosis not present

## 2015-11-21 DIAGNOSIS — E119 Type 2 diabetes mellitus without complications: Secondary | ICD-10-CM | POA: Diagnosis not present

## 2015-11-21 DIAGNOSIS — E0829 Diabetes mellitus due to underlying condition with other diabetic kidney complication: Secondary | ICD-10-CM | POA: Diagnosis not present

## 2015-11-21 DIAGNOSIS — R41841 Cognitive communication deficit: Secondary | ICD-10-CM | POA: Diagnosis not present

## 2015-11-21 DIAGNOSIS — E785 Hyperlipidemia, unspecified: Secondary | ICD-10-CM | POA: Diagnosis not present

## 2015-11-21 DIAGNOSIS — I69320 Aphasia following cerebral infarction: Secondary | ICD-10-CM | POA: Diagnosis not present

## 2015-11-21 DIAGNOSIS — M1000 Idiopathic gout, unspecified site: Secondary | ICD-10-CM | POA: Diagnosis not present

## 2015-11-21 DIAGNOSIS — I10 Essential (primary) hypertension: Secondary | ICD-10-CM | POA: Diagnosis not present

## 2015-11-23 DIAGNOSIS — M6281 Muscle weakness (generalized): Secondary | ICD-10-CM | POA: Diagnosis not present

## 2015-11-23 DIAGNOSIS — I4891 Unspecified atrial fibrillation: Secondary | ICD-10-CM | POA: Diagnosis not present

## 2015-11-23 DIAGNOSIS — R41841 Cognitive communication deficit: Secondary | ICD-10-CM | POA: Diagnosis not present

## 2015-11-23 DIAGNOSIS — I10 Essential (primary) hypertension: Secondary | ICD-10-CM | POA: Diagnosis not present

## 2015-11-23 DIAGNOSIS — I69320 Aphasia following cerebral infarction: Secondary | ICD-10-CM | POA: Diagnosis not present

## 2015-11-23 DIAGNOSIS — I69354 Hemiplegia and hemiparesis following cerebral infarction affecting left non-dominant side: Secondary | ICD-10-CM | POA: Diagnosis not present

## 2015-11-24 DIAGNOSIS — I4891 Unspecified atrial fibrillation: Secondary | ICD-10-CM | POA: Diagnosis not present

## 2015-11-24 DIAGNOSIS — I69320 Aphasia following cerebral infarction: Secondary | ICD-10-CM | POA: Diagnosis not present

## 2015-11-24 DIAGNOSIS — M6281 Muscle weakness (generalized): Secondary | ICD-10-CM | POA: Diagnosis not present

## 2015-11-24 DIAGNOSIS — I69354 Hemiplegia and hemiparesis following cerebral infarction affecting left non-dominant side: Secondary | ICD-10-CM | POA: Diagnosis not present

## 2015-11-24 DIAGNOSIS — R41841 Cognitive communication deficit: Secondary | ICD-10-CM | POA: Diagnosis not present

## 2015-11-24 DIAGNOSIS — I10 Essential (primary) hypertension: Secondary | ICD-10-CM | POA: Diagnosis not present

## 2015-11-25 DIAGNOSIS — R41841 Cognitive communication deficit: Secondary | ICD-10-CM | POA: Diagnosis not present

## 2015-11-25 DIAGNOSIS — I10 Essential (primary) hypertension: Secondary | ICD-10-CM | POA: Diagnosis not present

## 2015-11-25 DIAGNOSIS — I4891 Unspecified atrial fibrillation: Secondary | ICD-10-CM | POA: Diagnosis not present

## 2015-11-25 DIAGNOSIS — I69354 Hemiplegia and hemiparesis following cerebral infarction affecting left non-dominant side: Secondary | ICD-10-CM | POA: Diagnosis not present

## 2015-11-25 DIAGNOSIS — I69320 Aphasia following cerebral infarction: Secondary | ICD-10-CM | POA: Diagnosis not present

## 2015-11-25 DIAGNOSIS — M6281 Muscle weakness (generalized): Secondary | ICD-10-CM | POA: Diagnosis not present

## 2015-11-26 DIAGNOSIS — R001 Bradycardia, unspecified: Secondary | ICD-10-CM | POA: Diagnosis not present

## 2015-11-26 DIAGNOSIS — R031 Nonspecific low blood-pressure reading: Secondary | ICD-10-CM | POA: Diagnosis not present

## 2015-11-27 DIAGNOSIS — I69320 Aphasia following cerebral infarction: Secondary | ICD-10-CM | POA: Diagnosis not present

## 2015-11-27 DIAGNOSIS — I4891 Unspecified atrial fibrillation: Secondary | ICD-10-CM | POA: Diagnosis not present

## 2015-11-27 DIAGNOSIS — I10 Essential (primary) hypertension: Secondary | ICD-10-CM | POA: Diagnosis not present

## 2015-11-27 DIAGNOSIS — I69354 Hemiplegia and hemiparesis following cerebral infarction affecting left non-dominant side: Secondary | ICD-10-CM | POA: Diagnosis not present

## 2015-11-27 DIAGNOSIS — M6281 Muscle weakness (generalized): Secondary | ICD-10-CM | POA: Diagnosis not present

## 2015-11-27 DIAGNOSIS — R41841 Cognitive communication deficit: Secondary | ICD-10-CM | POA: Diagnosis not present

## 2015-11-29 DIAGNOSIS — R9431 Abnormal electrocardiogram [ECG] [EKG]: Secondary | ICD-10-CM | POA: Diagnosis not present

## 2015-11-29 DIAGNOSIS — R001 Bradycardia, unspecified: Secondary | ICD-10-CM | POA: Diagnosis not present

## 2015-11-30 DIAGNOSIS — R41841 Cognitive communication deficit: Secondary | ICD-10-CM | POA: Diagnosis not present

## 2015-11-30 DIAGNOSIS — I69354 Hemiplegia and hemiparesis following cerebral infarction affecting left non-dominant side: Secondary | ICD-10-CM | POA: Diagnosis not present

## 2015-11-30 DIAGNOSIS — I4891 Unspecified atrial fibrillation: Secondary | ICD-10-CM | POA: Diagnosis not present

## 2015-11-30 DIAGNOSIS — I10 Essential (primary) hypertension: Secondary | ICD-10-CM | POA: Diagnosis not present

## 2015-11-30 DIAGNOSIS — I69320 Aphasia following cerebral infarction: Secondary | ICD-10-CM | POA: Diagnosis not present

## 2015-11-30 DIAGNOSIS — M6281 Muscle weakness (generalized): Secondary | ICD-10-CM | POA: Diagnosis not present

## 2015-12-01 DIAGNOSIS — I69320 Aphasia following cerebral infarction: Secondary | ICD-10-CM | POA: Diagnosis not present

## 2015-12-01 DIAGNOSIS — R41841 Cognitive communication deficit: Secondary | ICD-10-CM | POA: Diagnosis not present

## 2015-12-01 DIAGNOSIS — I10 Essential (primary) hypertension: Secondary | ICD-10-CM | POA: Diagnosis not present

## 2015-12-01 DIAGNOSIS — M6281 Muscle weakness (generalized): Secondary | ICD-10-CM | POA: Diagnosis not present

## 2015-12-01 DIAGNOSIS — I4891 Unspecified atrial fibrillation: Secondary | ICD-10-CM | POA: Diagnosis not present

## 2015-12-01 DIAGNOSIS — I69354 Hemiplegia and hemiparesis following cerebral infarction affecting left non-dominant side: Secondary | ICD-10-CM | POA: Diagnosis not present

## 2015-12-02 DIAGNOSIS — R41841 Cognitive communication deficit: Secondary | ICD-10-CM | POA: Diagnosis not present

## 2015-12-02 DIAGNOSIS — M6281 Muscle weakness (generalized): Secondary | ICD-10-CM | POA: Diagnosis not present

## 2015-12-02 DIAGNOSIS — I10 Essential (primary) hypertension: Secondary | ICD-10-CM | POA: Diagnosis not present

## 2015-12-02 DIAGNOSIS — I69320 Aphasia following cerebral infarction: Secondary | ICD-10-CM | POA: Diagnosis not present

## 2015-12-02 DIAGNOSIS — I4891 Unspecified atrial fibrillation: Secondary | ICD-10-CM | POA: Diagnosis not present

## 2015-12-02 DIAGNOSIS — I69354 Hemiplegia and hemiparesis following cerebral infarction affecting left non-dominant side: Secondary | ICD-10-CM | POA: Diagnosis not present

## 2015-12-03 DIAGNOSIS — M6281 Muscle weakness (generalized): Secondary | ICD-10-CM | POA: Diagnosis not present

## 2015-12-03 DIAGNOSIS — I69320 Aphasia following cerebral infarction: Secondary | ICD-10-CM | POA: Diagnosis not present

## 2015-12-03 DIAGNOSIS — R41841 Cognitive communication deficit: Secondary | ICD-10-CM | POA: Diagnosis not present

## 2015-12-03 DIAGNOSIS — I10 Essential (primary) hypertension: Secondary | ICD-10-CM | POA: Diagnosis not present

## 2015-12-03 DIAGNOSIS — I4891 Unspecified atrial fibrillation: Secondary | ICD-10-CM | POA: Diagnosis not present

## 2015-12-03 DIAGNOSIS — I69354 Hemiplegia and hemiparesis following cerebral infarction affecting left non-dominant side: Secondary | ICD-10-CM | POA: Diagnosis not present

## 2015-12-04 DIAGNOSIS — I69320 Aphasia following cerebral infarction: Secondary | ICD-10-CM | POA: Diagnosis not present

## 2015-12-04 DIAGNOSIS — I69354 Hemiplegia and hemiparesis following cerebral infarction affecting left non-dominant side: Secondary | ICD-10-CM | POA: Diagnosis not present

## 2015-12-04 DIAGNOSIS — I4891 Unspecified atrial fibrillation: Secondary | ICD-10-CM | POA: Diagnosis not present

## 2015-12-04 DIAGNOSIS — R41841 Cognitive communication deficit: Secondary | ICD-10-CM | POA: Diagnosis not present

## 2015-12-04 DIAGNOSIS — I10 Essential (primary) hypertension: Secondary | ICD-10-CM | POA: Diagnosis not present

## 2015-12-04 DIAGNOSIS — M6281 Muscle weakness (generalized): Secondary | ICD-10-CM | POA: Diagnosis not present

## 2015-12-06 DIAGNOSIS — R001 Bradycardia, unspecified: Secondary | ICD-10-CM | POA: Diagnosis not present

## 2015-12-06 DIAGNOSIS — I10 Essential (primary) hypertension: Secondary | ICD-10-CM | POA: Diagnosis not present

## 2015-12-06 DIAGNOSIS — M6281 Muscle weakness (generalized): Secondary | ICD-10-CM | POA: Diagnosis not present

## 2015-12-06 DIAGNOSIS — E119 Type 2 diabetes mellitus without complications: Secondary | ICD-10-CM | POA: Diagnosis not present

## 2015-12-06 DIAGNOSIS — I69320 Aphasia following cerebral infarction: Secondary | ICD-10-CM | POA: Diagnosis not present

## 2015-12-06 DIAGNOSIS — R41841 Cognitive communication deficit: Secondary | ICD-10-CM | POA: Diagnosis not present

## 2015-12-06 DIAGNOSIS — I4891 Unspecified atrial fibrillation: Secondary | ICD-10-CM | POA: Diagnosis not present

## 2015-12-06 DIAGNOSIS — I69354 Hemiplegia and hemiparesis following cerebral infarction affecting left non-dominant side: Secondary | ICD-10-CM | POA: Diagnosis not present

## 2015-12-08 DIAGNOSIS — I10 Essential (primary) hypertension: Secondary | ICD-10-CM | POA: Diagnosis not present

## 2015-12-08 DIAGNOSIS — R41841 Cognitive communication deficit: Secondary | ICD-10-CM | POA: Diagnosis not present

## 2015-12-08 DIAGNOSIS — I4891 Unspecified atrial fibrillation: Secondary | ICD-10-CM | POA: Diagnosis not present

## 2015-12-08 DIAGNOSIS — I69354 Hemiplegia and hemiparesis following cerebral infarction affecting left non-dominant side: Secondary | ICD-10-CM | POA: Diagnosis not present

## 2015-12-08 DIAGNOSIS — I69320 Aphasia following cerebral infarction: Secondary | ICD-10-CM | POA: Diagnosis not present

## 2015-12-08 DIAGNOSIS — M6281 Muscle weakness (generalized): Secondary | ICD-10-CM | POA: Diagnosis not present

## 2015-12-09 DIAGNOSIS — I4891 Unspecified atrial fibrillation: Secondary | ICD-10-CM | POA: Diagnosis not present

## 2015-12-09 DIAGNOSIS — R41841 Cognitive communication deficit: Secondary | ICD-10-CM | POA: Diagnosis not present

## 2015-12-09 DIAGNOSIS — M6281 Muscle weakness (generalized): Secondary | ICD-10-CM | POA: Diagnosis not present

## 2015-12-09 DIAGNOSIS — I69354 Hemiplegia and hemiparesis following cerebral infarction affecting left non-dominant side: Secondary | ICD-10-CM | POA: Diagnosis not present

## 2015-12-09 DIAGNOSIS — I69320 Aphasia following cerebral infarction: Secondary | ICD-10-CM | POA: Diagnosis not present

## 2015-12-09 DIAGNOSIS — I10 Essential (primary) hypertension: Secondary | ICD-10-CM | POA: Diagnosis not present

## 2015-12-11 DIAGNOSIS — I4891 Unspecified atrial fibrillation: Secondary | ICD-10-CM | POA: Diagnosis not present

## 2015-12-11 DIAGNOSIS — R41841 Cognitive communication deficit: Secondary | ICD-10-CM | POA: Diagnosis not present

## 2015-12-11 DIAGNOSIS — I69354 Hemiplegia and hemiparesis following cerebral infarction affecting left non-dominant side: Secondary | ICD-10-CM | POA: Diagnosis not present

## 2015-12-11 DIAGNOSIS — I69320 Aphasia following cerebral infarction: Secondary | ICD-10-CM | POA: Diagnosis not present

## 2015-12-11 DIAGNOSIS — I10 Essential (primary) hypertension: Secondary | ICD-10-CM | POA: Diagnosis not present

## 2015-12-11 DIAGNOSIS — M6281 Muscle weakness (generalized): Secondary | ICD-10-CM | POA: Diagnosis not present

## 2015-12-14 DIAGNOSIS — R41841 Cognitive communication deficit: Secondary | ICD-10-CM | POA: Diagnosis not present

## 2015-12-14 DIAGNOSIS — M6281 Muscle weakness (generalized): Secondary | ICD-10-CM | POA: Diagnosis not present

## 2015-12-14 DIAGNOSIS — I10 Essential (primary) hypertension: Secondary | ICD-10-CM | POA: Diagnosis not present

## 2015-12-14 DIAGNOSIS — I4891 Unspecified atrial fibrillation: Secondary | ICD-10-CM | POA: Diagnosis not present

## 2015-12-14 DIAGNOSIS — I69354 Hemiplegia and hemiparesis following cerebral infarction affecting left non-dominant side: Secondary | ICD-10-CM | POA: Diagnosis not present

## 2015-12-14 DIAGNOSIS — I69320 Aphasia following cerebral infarction: Secondary | ICD-10-CM | POA: Diagnosis not present

## 2015-12-15 DIAGNOSIS — I69320 Aphasia following cerebral infarction: Secondary | ICD-10-CM | POA: Diagnosis not present

## 2015-12-15 DIAGNOSIS — I69354 Hemiplegia and hemiparesis following cerebral infarction affecting left non-dominant side: Secondary | ICD-10-CM | POA: Diagnosis not present

## 2015-12-15 DIAGNOSIS — I10 Essential (primary) hypertension: Secondary | ICD-10-CM | POA: Diagnosis not present

## 2015-12-15 DIAGNOSIS — R41841 Cognitive communication deficit: Secondary | ICD-10-CM | POA: Diagnosis not present

## 2015-12-15 DIAGNOSIS — I4891 Unspecified atrial fibrillation: Secondary | ICD-10-CM | POA: Diagnosis not present

## 2015-12-15 DIAGNOSIS — M6281 Muscle weakness (generalized): Secondary | ICD-10-CM | POA: Diagnosis not present

## 2015-12-16 DIAGNOSIS — I4891 Unspecified atrial fibrillation: Secondary | ICD-10-CM | POA: Diagnosis not present

## 2015-12-16 DIAGNOSIS — I69354 Hemiplegia and hemiparesis following cerebral infarction affecting left non-dominant side: Secondary | ICD-10-CM | POA: Diagnosis not present

## 2015-12-16 DIAGNOSIS — R41841 Cognitive communication deficit: Secondary | ICD-10-CM | POA: Diagnosis not present

## 2015-12-16 DIAGNOSIS — I10 Essential (primary) hypertension: Secondary | ICD-10-CM | POA: Diagnosis not present

## 2015-12-16 DIAGNOSIS — M6281 Muscle weakness (generalized): Secondary | ICD-10-CM | POA: Diagnosis not present

## 2015-12-16 DIAGNOSIS — I69320 Aphasia following cerebral infarction: Secondary | ICD-10-CM | POA: Diagnosis not present

## 2015-12-18 DIAGNOSIS — I69320 Aphasia following cerebral infarction: Secondary | ICD-10-CM | POA: Diagnosis not present

## 2015-12-18 DIAGNOSIS — M6281 Muscle weakness (generalized): Secondary | ICD-10-CM | POA: Diagnosis not present

## 2015-12-18 DIAGNOSIS — I10 Essential (primary) hypertension: Secondary | ICD-10-CM | POA: Diagnosis not present

## 2015-12-18 DIAGNOSIS — I69354 Hemiplegia and hemiparesis following cerebral infarction affecting left non-dominant side: Secondary | ICD-10-CM | POA: Diagnosis not present

## 2015-12-18 DIAGNOSIS — R41841 Cognitive communication deficit: Secondary | ICD-10-CM | POA: Diagnosis not present

## 2015-12-18 DIAGNOSIS — I4891 Unspecified atrial fibrillation: Secondary | ICD-10-CM | POA: Diagnosis not present

## 2015-12-21 DIAGNOSIS — I4891 Unspecified atrial fibrillation: Secondary | ICD-10-CM | POA: Diagnosis not present

## 2015-12-21 DIAGNOSIS — R001 Bradycardia, unspecified: Secondary | ICD-10-CM | POA: Diagnosis not present

## 2015-12-21 DIAGNOSIS — E119 Type 2 diabetes mellitus without complications: Secondary | ICD-10-CM | POA: Diagnosis not present

## 2015-12-21 DIAGNOSIS — M6281 Muscle weakness (generalized): Secondary | ICD-10-CM | POA: Diagnosis not present

## 2015-12-21 DIAGNOSIS — E0840 Diabetes mellitus due to underlying condition with diabetic neuropathy, unspecified: Secondary | ICD-10-CM | POA: Diagnosis not present

## 2015-12-21 DIAGNOSIS — I69320 Aphasia following cerebral infarction: Secondary | ICD-10-CM | POA: Diagnosis not present

## 2015-12-21 DIAGNOSIS — I69959 Hemiplegia and hemiparesis following unspecified cerebrovascular disease affecting unspecified side: Secondary | ICD-10-CM | POA: Diagnosis not present

## 2015-12-21 DIAGNOSIS — R41841 Cognitive communication deficit: Secondary | ICD-10-CM | POA: Diagnosis not present

## 2015-12-21 DIAGNOSIS — I10 Essential (primary) hypertension: Secondary | ICD-10-CM | POA: Diagnosis not present

## 2015-12-21 DIAGNOSIS — I69354 Hemiplegia and hemiparesis following cerebral infarction affecting left non-dominant side: Secondary | ICD-10-CM | POA: Diagnosis not present

## 2015-12-22 DIAGNOSIS — I4891 Unspecified atrial fibrillation: Secondary | ICD-10-CM | POA: Diagnosis not present

## 2015-12-22 DIAGNOSIS — I69354 Hemiplegia and hemiparesis following cerebral infarction affecting left non-dominant side: Secondary | ICD-10-CM | POA: Diagnosis not present

## 2015-12-22 DIAGNOSIS — R41841 Cognitive communication deficit: Secondary | ICD-10-CM | POA: Diagnosis not present

## 2015-12-22 DIAGNOSIS — I69320 Aphasia following cerebral infarction: Secondary | ICD-10-CM | POA: Diagnosis not present

## 2015-12-22 DIAGNOSIS — I10 Essential (primary) hypertension: Secondary | ICD-10-CM | POA: Diagnosis not present

## 2015-12-22 DIAGNOSIS — M6281 Muscle weakness (generalized): Secondary | ICD-10-CM | POA: Diagnosis not present

## 2015-12-23 DIAGNOSIS — I69320 Aphasia following cerebral infarction: Secondary | ICD-10-CM | POA: Diagnosis not present

## 2015-12-23 DIAGNOSIS — R41841 Cognitive communication deficit: Secondary | ICD-10-CM | POA: Diagnosis not present

## 2015-12-23 DIAGNOSIS — M6281 Muscle weakness (generalized): Secondary | ICD-10-CM | POA: Diagnosis not present

## 2015-12-23 DIAGNOSIS — I4891 Unspecified atrial fibrillation: Secondary | ICD-10-CM | POA: Diagnosis not present

## 2015-12-23 DIAGNOSIS — I10 Essential (primary) hypertension: Secondary | ICD-10-CM | POA: Diagnosis not present

## 2015-12-23 DIAGNOSIS — I69354 Hemiplegia and hemiparesis following cerebral infarction affecting left non-dominant side: Secondary | ICD-10-CM | POA: Diagnosis not present

## 2015-12-24 DIAGNOSIS — I69320 Aphasia following cerebral infarction: Secondary | ICD-10-CM | POA: Diagnosis not present

## 2015-12-24 DIAGNOSIS — R41841 Cognitive communication deficit: Secondary | ICD-10-CM | POA: Diagnosis not present

## 2015-12-24 DIAGNOSIS — I10 Essential (primary) hypertension: Secondary | ICD-10-CM | POA: Diagnosis not present

## 2015-12-24 DIAGNOSIS — I4891 Unspecified atrial fibrillation: Secondary | ICD-10-CM | POA: Diagnosis not present

## 2015-12-24 DIAGNOSIS — M6281 Muscle weakness (generalized): Secondary | ICD-10-CM | POA: Diagnosis not present

## 2015-12-24 DIAGNOSIS — I69354 Hemiplegia and hemiparesis following cerebral infarction affecting left non-dominant side: Secondary | ICD-10-CM | POA: Diagnosis not present

## 2015-12-25 DIAGNOSIS — I69320 Aphasia following cerebral infarction: Secondary | ICD-10-CM | POA: Diagnosis not present

## 2015-12-25 DIAGNOSIS — M6281 Muscle weakness (generalized): Secondary | ICD-10-CM | POA: Diagnosis not present

## 2015-12-25 DIAGNOSIS — R41841 Cognitive communication deficit: Secondary | ICD-10-CM | POA: Diagnosis not present

## 2015-12-25 DIAGNOSIS — I69354 Hemiplegia and hemiparesis following cerebral infarction affecting left non-dominant side: Secondary | ICD-10-CM | POA: Diagnosis not present

## 2015-12-25 DIAGNOSIS — I4891 Unspecified atrial fibrillation: Secondary | ICD-10-CM | POA: Diagnosis not present

## 2015-12-25 DIAGNOSIS — I10 Essential (primary) hypertension: Secondary | ICD-10-CM | POA: Diagnosis not present

## 2015-12-26 DIAGNOSIS — M6281 Muscle weakness (generalized): Secondary | ICD-10-CM | POA: Diagnosis not present

## 2015-12-26 DIAGNOSIS — I69320 Aphasia following cerebral infarction: Secondary | ICD-10-CM | POA: Diagnosis not present

## 2015-12-26 DIAGNOSIS — R41841 Cognitive communication deficit: Secondary | ICD-10-CM | POA: Diagnosis not present

## 2015-12-26 DIAGNOSIS — I69354 Hemiplegia and hemiparesis following cerebral infarction affecting left non-dominant side: Secondary | ICD-10-CM | POA: Diagnosis not present

## 2015-12-26 DIAGNOSIS — I4891 Unspecified atrial fibrillation: Secondary | ICD-10-CM | POA: Diagnosis not present

## 2015-12-26 DIAGNOSIS — I10 Essential (primary) hypertension: Secondary | ICD-10-CM | POA: Diagnosis not present

## 2015-12-28 DIAGNOSIS — I4891 Unspecified atrial fibrillation: Secondary | ICD-10-CM | POA: Diagnosis not present

## 2015-12-28 DIAGNOSIS — I10 Essential (primary) hypertension: Secondary | ICD-10-CM | POA: Diagnosis not present

## 2015-12-28 DIAGNOSIS — I69354 Hemiplegia and hemiparesis following cerebral infarction affecting left non-dominant side: Secondary | ICD-10-CM | POA: Diagnosis not present

## 2015-12-28 DIAGNOSIS — M6281 Muscle weakness (generalized): Secondary | ICD-10-CM | POA: Diagnosis not present

## 2015-12-28 DIAGNOSIS — I69320 Aphasia following cerebral infarction: Secondary | ICD-10-CM | POA: Diagnosis not present

## 2015-12-28 DIAGNOSIS — R41841 Cognitive communication deficit: Secondary | ICD-10-CM | POA: Diagnosis not present

## 2015-12-29 DIAGNOSIS — M6281 Muscle weakness (generalized): Secondary | ICD-10-CM | POA: Diagnosis not present

## 2015-12-29 DIAGNOSIS — R41841 Cognitive communication deficit: Secondary | ICD-10-CM | POA: Diagnosis not present

## 2015-12-29 DIAGNOSIS — I4891 Unspecified atrial fibrillation: Secondary | ICD-10-CM | POA: Diagnosis not present

## 2015-12-29 DIAGNOSIS — I10 Essential (primary) hypertension: Secondary | ICD-10-CM | POA: Diagnosis not present

## 2015-12-29 DIAGNOSIS — I69354 Hemiplegia and hemiparesis following cerebral infarction affecting left non-dominant side: Secondary | ICD-10-CM | POA: Diagnosis not present

## 2015-12-29 DIAGNOSIS — I69320 Aphasia following cerebral infarction: Secondary | ICD-10-CM | POA: Diagnosis not present

## 2015-12-30 DIAGNOSIS — M6281 Muscle weakness (generalized): Secondary | ICD-10-CM | POA: Diagnosis not present

## 2015-12-30 DIAGNOSIS — I10 Essential (primary) hypertension: Secondary | ICD-10-CM | POA: Diagnosis not present

## 2015-12-30 DIAGNOSIS — I69354 Hemiplegia and hemiparesis following cerebral infarction affecting left non-dominant side: Secondary | ICD-10-CM | POA: Diagnosis not present

## 2015-12-30 DIAGNOSIS — I69320 Aphasia following cerebral infarction: Secondary | ICD-10-CM | POA: Diagnosis not present

## 2015-12-30 DIAGNOSIS — I4891 Unspecified atrial fibrillation: Secondary | ICD-10-CM | POA: Diagnosis not present

## 2015-12-30 DIAGNOSIS — R41841 Cognitive communication deficit: Secondary | ICD-10-CM | POA: Diagnosis not present

## 2015-12-31 DIAGNOSIS — M6281 Muscle weakness (generalized): Secondary | ICD-10-CM | POA: Diagnosis not present

## 2015-12-31 DIAGNOSIS — R41841 Cognitive communication deficit: Secondary | ICD-10-CM | POA: Diagnosis not present

## 2015-12-31 DIAGNOSIS — I4891 Unspecified atrial fibrillation: Secondary | ICD-10-CM | POA: Diagnosis not present

## 2015-12-31 DIAGNOSIS — I69354 Hemiplegia and hemiparesis following cerebral infarction affecting left non-dominant side: Secondary | ICD-10-CM | POA: Diagnosis not present

## 2015-12-31 DIAGNOSIS — I69320 Aphasia following cerebral infarction: Secondary | ICD-10-CM | POA: Diagnosis not present

## 2015-12-31 DIAGNOSIS — I10 Essential (primary) hypertension: Secondary | ICD-10-CM | POA: Diagnosis not present

## 2016-01-01 DIAGNOSIS — I69354 Hemiplegia and hemiparesis following cerebral infarction affecting left non-dominant side: Secondary | ICD-10-CM | POA: Diagnosis not present

## 2016-01-01 DIAGNOSIS — I10 Essential (primary) hypertension: Secondary | ICD-10-CM | POA: Diagnosis not present

## 2016-01-01 DIAGNOSIS — I4891 Unspecified atrial fibrillation: Secondary | ICD-10-CM | POA: Diagnosis not present

## 2016-01-01 DIAGNOSIS — R41841 Cognitive communication deficit: Secondary | ICD-10-CM | POA: Diagnosis not present

## 2016-01-01 DIAGNOSIS — I69320 Aphasia following cerebral infarction: Secondary | ICD-10-CM | POA: Diagnosis not present

## 2016-01-01 DIAGNOSIS — M6281 Muscle weakness (generalized): Secondary | ICD-10-CM | POA: Diagnosis not present

## 2016-01-04 DIAGNOSIS — I69354 Hemiplegia and hemiparesis following cerebral infarction affecting left non-dominant side: Secondary | ICD-10-CM | POA: Diagnosis not present

## 2016-01-04 DIAGNOSIS — R41841 Cognitive communication deficit: Secondary | ICD-10-CM | POA: Diagnosis not present

## 2016-01-04 DIAGNOSIS — M6281 Muscle weakness (generalized): Secondary | ICD-10-CM | POA: Diagnosis not present

## 2016-01-04 DIAGNOSIS — I4891 Unspecified atrial fibrillation: Secondary | ICD-10-CM | POA: Diagnosis not present

## 2016-01-04 DIAGNOSIS — I10 Essential (primary) hypertension: Secondary | ICD-10-CM | POA: Diagnosis not present

## 2016-01-04 DIAGNOSIS — I69320 Aphasia following cerebral infarction: Secondary | ICD-10-CM | POA: Diagnosis not present

## 2016-01-05 DIAGNOSIS — R41841 Cognitive communication deficit: Secondary | ICD-10-CM | POA: Diagnosis not present

## 2016-01-05 DIAGNOSIS — I69320 Aphasia following cerebral infarction: Secondary | ICD-10-CM | POA: Diagnosis not present

## 2016-01-05 DIAGNOSIS — M6281 Muscle weakness (generalized): Secondary | ICD-10-CM | POA: Diagnosis not present

## 2016-01-05 DIAGNOSIS — I69354 Hemiplegia and hemiparesis following cerebral infarction affecting left non-dominant side: Secondary | ICD-10-CM | POA: Diagnosis not present

## 2016-01-05 DIAGNOSIS — I10 Essential (primary) hypertension: Secondary | ICD-10-CM | POA: Diagnosis not present

## 2016-01-05 DIAGNOSIS — I4891 Unspecified atrial fibrillation: Secondary | ICD-10-CM | POA: Diagnosis not present

## 2016-01-06 DIAGNOSIS — E119 Type 2 diabetes mellitus without complications: Secondary | ICD-10-CM | POA: Diagnosis not present

## 2016-01-06 DIAGNOSIS — I10 Essential (primary) hypertension: Secondary | ICD-10-CM | POA: Diagnosis not present

## 2016-01-06 DIAGNOSIS — M6281 Muscle weakness (generalized): Secondary | ICD-10-CM | POA: Diagnosis not present

## 2016-01-06 DIAGNOSIS — R41841 Cognitive communication deficit: Secondary | ICD-10-CM | POA: Diagnosis not present

## 2016-01-06 DIAGNOSIS — I69354 Hemiplegia and hemiparesis following cerebral infarction affecting left non-dominant side: Secondary | ICD-10-CM | POA: Diagnosis not present

## 2016-01-06 DIAGNOSIS — I69320 Aphasia following cerebral infarction: Secondary | ICD-10-CM | POA: Diagnosis not present

## 2016-01-06 DIAGNOSIS — M1009 Idiopathic gout, multiple sites: Secondary | ICD-10-CM | POA: Diagnosis not present

## 2016-01-07 DIAGNOSIS — M1009 Idiopathic gout, multiple sites: Secondary | ICD-10-CM | POA: Diagnosis not present

## 2016-01-07 DIAGNOSIS — M6281 Muscle weakness (generalized): Secondary | ICD-10-CM | POA: Diagnosis not present

## 2016-01-07 DIAGNOSIS — I69354 Hemiplegia and hemiparesis following cerebral infarction affecting left non-dominant side: Secondary | ICD-10-CM | POA: Diagnosis not present

## 2016-01-07 DIAGNOSIS — R41841 Cognitive communication deficit: Secondary | ICD-10-CM | POA: Diagnosis not present

## 2016-01-07 DIAGNOSIS — I10 Essential (primary) hypertension: Secondary | ICD-10-CM | POA: Diagnosis not present

## 2016-01-07 DIAGNOSIS — I69320 Aphasia following cerebral infarction: Secondary | ICD-10-CM | POA: Diagnosis not present

## 2016-01-08 DIAGNOSIS — I69320 Aphasia following cerebral infarction: Secondary | ICD-10-CM | POA: Diagnosis not present

## 2016-01-08 DIAGNOSIS — R41841 Cognitive communication deficit: Secondary | ICD-10-CM | POA: Diagnosis not present

## 2016-01-08 DIAGNOSIS — I69354 Hemiplegia and hemiparesis following cerebral infarction affecting left non-dominant side: Secondary | ICD-10-CM | POA: Diagnosis not present

## 2016-01-08 DIAGNOSIS — M6281 Muscle weakness (generalized): Secondary | ICD-10-CM | POA: Diagnosis not present

## 2016-01-08 DIAGNOSIS — M1009 Idiopathic gout, multiple sites: Secondary | ICD-10-CM | POA: Diagnosis not present

## 2016-01-08 DIAGNOSIS — I10 Essential (primary) hypertension: Secondary | ICD-10-CM | POA: Diagnosis not present

## 2016-01-11 DIAGNOSIS — I10 Essential (primary) hypertension: Secondary | ICD-10-CM | POA: Diagnosis not present

## 2016-01-11 DIAGNOSIS — M1009 Idiopathic gout, multiple sites: Secondary | ICD-10-CM | POA: Diagnosis not present

## 2016-01-11 DIAGNOSIS — I69320 Aphasia following cerebral infarction: Secondary | ICD-10-CM | POA: Diagnosis not present

## 2016-01-11 DIAGNOSIS — M6281 Muscle weakness (generalized): Secondary | ICD-10-CM | POA: Diagnosis not present

## 2016-01-11 DIAGNOSIS — R41841 Cognitive communication deficit: Secondary | ICD-10-CM | POA: Diagnosis not present

## 2016-01-11 DIAGNOSIS — I69354 Hemiplegia and hemiparesis following cerebral infarction affecting left non-dominant side: Secondary | ICD-10-CM | POA: Diagnosis not present

## 2016-01-12 DIAGNOSIS — I10 Essential (primary) hypertension: Secondary | ICD-10-CM | POA: Diagnosis not present

## 2016-01-12 DIAGNOSIS — M1009 Idiopathic gout, multiple sites: Secondary | ICD-10-CM | POA: Diagnosis not present

## 2016-01-12 DIAGNOSIS — I69354 Hemiplegia and hemiparesis following cerebral infarction affecting left non-dominant side: Secondary | ICD-10-CM | POA: Diagnosis not present

## 2016-01-12 DIAGNOSIS — M6281 Muscle weakness (generalized): Secondary | ICD-10-CM | POA: Diagnosis not present

## 2016-01-12 DIAGNOSIS — I69320 Aphasia following cerebral infarction: Secondary | ICD-10-CM | POA: Diagnosis not present

## 2016-01-12 DIAGNOSIS — R41841 Cognitive communication deficit: Secondary | ICD-10-CM | POA: Diagnosis not present

## 2016-01-13 DIAGNOSIS — I69354 Hemiplegia and hemiparesis following cerebral infarction affecting left non-dominant side: Secondary | ICD-10-CM | POA: Diagnosis not present

## 2016-01-13 DIAGNOSIS — I10 Essential (primary) hypertension: Secondary | ICD-10-CM | POA: Diagnosis not present

## 2016-01-13 DIAGNOSIS — M6281 Muscle weakness (generalized): Secondary | ICD-10-CM | POA: Diagnosis not present

## 2016-01-13 DIAGNOSIS — I69320 Aphasia following cerebral infarction: Secondary | ICD-10-CM | POA: Diagnosis not present

## 2016-01-13 DIAGNOSIS — M1009 Idiopathic gout, multiple sites: Secondary | ICD-10-CM | POA: Diagnosis not present

## 2016-01-13 DIAGNOSIS — R41841 Cognitive communication deficit: Secondary | ICD-10-CM | POA: Diagnosis not present

## 2016-01-15 DIAGNOSIS — M6281 Muscle weakness (generalized): Secondary | ICD-10-CM | POA: Diagnosis not present

## 2016-01-15 DIAGNOSIS — R41841 Cognitive communication deficit: Secondary | ICD-10-CM | POA: Diagnosis not present

## 2016-01-15 DIAGNOSIS — I69320 Aphasia following cerebral infarction: Secondary | ICD-10-CM | POA: Diagnosis not present

## 2016-01-15 DIAGNOSIS — I69354 Hemiplegia and hemiparesis following cerebral infarction affecting left non-dominant side: Secondary | ICD-10-CM | POA: Diagnosis not present

## 2016-01-15 DIAGNOSIS — I10 Essential (primary) hypertension: Secondary | ICD-10-CM | POA: Diagnosis not present

## 2016-01-15 DIAGNOSIS — M1009 Idiopathic gout, multiple sites: Secondary | ICD-10-CM | POA: Diagnosis not present

## 2016-01-18 DIAGNOSIS — I69354 Hemiplegia and hemiparesis following cerebral infarction affecting left non-dominant side: Secondary | ICD-10-CM | POA: Diagnosis not present

## 2016-01-18 DIAGNOSIS — R41841 Cognitive communication deficit: Secondary | ICD-10-CM | POA: Diagnosis not present

## 2016-01-18 DIAGNOSIS — M6281 Muscle weakness (generalized): Secondary | ICD-10-CM | POA: Diagnosis not present

## 2016-01-18 DIAGNOSIS — I10 Essential (primary) hypertension: Secondary | ICD-10-CM | POA: Diagnosis not present

## 2016-01-18 DIAGNOSIS — M1009 Idiopathic gout, multiple sites: Secondary | ICD-10-CM | POA: Diagnosis not present

## 2016-01-18 DIAGNOSIS — I69320 Aphasia following cerebral infarction: Secondary | ICD-10-CM | POA: Diagnosis not present

## 2016-01-20 DIAGNOSIS — I10 Essential (primary) hypertension: Secondary | ICD-10-CM | POA: Diagnosis not present

## 2016-01-20 DIAGNOSIS — M6281 Muscle weakness (generalized): Secondary | ICD-10-CM | POA: Diagnosis not present

## 2016-01-20 DIAGNOSIS — I69320 Aphasia following cerebral infarction: Secondary | ICD-10-CM | POA: Diagnosis not present

## 2016-01-20 DIAGNOSIS — I69354 Hemiplegia and hemiparesis following cerebral infarction affecting left non-dominant side: Secondary | ICD-10-CM | POA: Diagnosis not present

## 2016-01-20 DIAGNOSIS — M1009 Idiopathic gout, multiple sites: Secondary | ICD-10-CM | POA: Diagnosis not present

## 2016-01-20 DIAGNOSIS — R41841 Cognitive communication deficit: Secondary | ICD-10-CM | POA: Diagnosis not present

## 2016-01-21 DIAGNOSIS — E0840 Diabetes mellitus due to underlying condition with diabetic neuropathy, unspecified: Secondary | ICD-10-CM | POA: Diagnosis not present

## 2016-01-21 DIAGNOSIS — I10 Essential (primary) hypertension: Secondary | ICD-10-CM | POA: Diagnosis not present

## 2016-01-21 DIAGNOSIS — I69959 Hemiplegia and hemiparesis following unspecified cerebrovascular disease affecting unspecified side: Secondary | ICD-10-CM | POA: Diagnosis not present

## 2016-01-21 DIAGNOSIS — R001 Bradycardia, unspecified: Secondary | ICD-10-CM | POA: Diagnosis not present

## 2016-01-21 DIAGNOSIS — E0841 Diabetes mellitus due to underlying condition with diabetic mononeuropathy: Secondary | ICD-10-CM | POA: Diagnosis not present

## 2016-01-22 DIAGNOSIS — I69320 Aphasia following cerebral infarction: Secondary | ICD-10-CM | POA: Diagnosis not present

## 2016-01-22 DIAGNOSIS — I69354 Hemiplegia and hemiparesis following cerebral infarction affecting left non-dominant side: Secondary | ICD-10-CM | POA: Diagnosis not present

## 2016-01-22 DIAGNOSIS — M1009 Idiopathic gout, multiple sites: Secondary | ICD-10-CM | POA: Diagnosis not present

## 2016-01-22 DIAGNOSIS — M6281 Muscle weakness (generalized): Secondary | ICD-10-CM | POA: Diagnosis not present

## 2016-01-22 DIAGNOSIS — I10 Essential (primary) hypertension: Secondary | ICD-10-CM | POA: Diagnosis not present

## 2016-01-22 DIAGNOSIS — R41841 Cognitive communication deficit: Secondary | ICD-10-CM | POA: Diagnosis not present

## 2016-01-25 DIAGNOSIS — I69320 Aphasia following cerebral infarction: Secondary | ICD-10-CM | POA: Diagnosis not present

## 2016-01-25 DIAGNOSIS — R41841 Cognitive communication deficit: Secondary | ICD-10-CM | POA: Diagnosis not present

## 2016-01-25 DIAGNOSIS — I69354 Hemiplegia and hemiparesis following cerebral infarction affecting left non-dominant side: Secondary | ICD-10-CM | POA: Diagnosis not present

## 2016-01-25 DIAGNOSIS — M1009 Idiopathic gout, multiple sites: Secondary | ICD-10-CM | POA: Diagnosis not present

## 2016-01-25 DIAGNOSIS — M6281 Muscle weakness (generalized): Secondary | ICD-10-CM | POA: Diagnosis not present

## 2016-01-25 DIAGNOSIS — I10 Essential (primary) hypertension: Secondary | ICD-10-CM | POA: Diagnosis not present

## 2016-02-22 DIAGNOSIS — E0829 Diabetes mellitus due to underlying condition with other diabetic kidney complication: Secondary | ICD-10-CM | POA: Diagnosis not present

## 2016-02-22 DIAGNOSIS — E785 Hyperlipidemia, unspecified: Secondary | ICD-10-CM | POA: Diagnosis not present

## 2016-02-24 DIAGNOSIS — E0840 Diabetes mellitus due to underlying condition with diabetic neuropathy, unspecified: Secondary | ICD-10-CM | POA: Diagnosis not present

## 2016-02-24 DIAGNOSIS — E1343 Other specified diabetes mellitus with diabetic autonomic (poly)neuropathy: Secondary | ICD-10-CM | POA: Diagnosis not present

## 2016-02-24 DIAGNOSIS — I10 Essential (primary) hypertension: Secondary | ICD-10-CM | POA: Diagnosis not present

## 2016-02-24 DIAGNOSIS — R001 Bradycardia, unspecified: Secondary | ICD-10-CM | POA: Diagnosis not present

## 2016-02-24 DIAGNOSIS — E0841 Diabetes mellitus due to underlying condition with diabetic mononeuropathy: Secondary | ICD-10-CM | POA: Diagnosis not present

## 2016-03-03 DIAGNOSIS — I10 Essential (primary) hypertension: Secondary | ICD-10-CM | POA: Diagnosis not present

## 2016-03-03 DIAGNOSIS — I69320 Aphasia following cerebral infarction: Secondary | ICD-10-CM | POA: Diagnosis not present

## 2016-03-03 DIAGNOSIS — M6281 Muscle weakness (generalized): Secondary | ICD-10-CM | POA: Diagnosis not present

## 2016-03-03 DIAGNOSIS — I69354 Hemiplegia and hemiparesis following cerebral infarction affecting left non-dominant side: Secondary | ICD-10-CM | POA: Diagnosis not present

## 2016-03-03 DIAGNOSIS — R41841 Cognitive communication deficit: Secondary | ICD-10-CM | POA: Diagnosis not present

## 2016-03-03 DIAGNOSIS — M1009 Idiopathic gout, multiple sites: Secondary | ICD-10-CM | POA: Diagnosis not present

## 2016-03-03 DIAGNOSIS — E119 Type 2 diabetes mellitus without complications: Secondary | ICD-10-CM | POA: Diagnosis not present

## 2016-03-04 DIAGNOSIS — M1009 Idiopathic gout, multiple sites: Secondary | ICD-10-CM | POA: Diagnosis not present

## 2016-03-04 DIAGNOSIS — I69320 Aphasia following cerebral infarction: Secondary | ICD-10-CM | POA: Diagnosis not present

## 2016-03-04 DIAGNOSIS — R41841 Cognitive communication deficit: Secondary | ICD-10-CM | POA: Diagnosis not present

## 2016-03-04 DIAGNOSIS — I69354 Hemiplegia and hemiparesis following cerebral infarction affecting left non-dominant side: Secondary | ICD-10-CM | POA: Diagnosis not present

## 2016-03-04 DIAGNOSIS — Z79899 Other long term (current) drug therapy: Secondary | ICD-10-CM | POA: Diagnosis not present

## 2016-03-04 DIAGNOSIS — I10 Essential (primary) hypertension: Secondary | ICD-10-CM | POA: Diagnosis not present

## 2016-03-04 DIAGNOSIS — M6281 Muscle weakness (generalized): Secondary | ICD-10-CM | POA: Diagnosis not present

## 2016-03-05 DIAGNOSIS — R41841 Cognitive communication deficit: Secondary | ICD-10-CM | POA: Diagnosis not present

## 2016-03-05 DIAGNOSIS — M1009 Idiopathic gout, multiple sites: Secondary | ICD-10-CM | POA: Diagnosis not present

## 2016-03-05 DIAGNOSIS — M6281 Muscle weakness (generalized): Secondary | ICD-10-CM | POA: Diagnosis not present

## 2016-03-05 DIAGNOSIS — I69354 Hemiplegia and hemiparesis following cerebral infarction affecting left non-dominant side: Secondary | ICD-10-CM | POA: Diagnosis not present

## 2016-03-05 DIAGNOSIS — I69320 Aphasia following cerebral infarction: Secondary | ICD-10-CM | POA: Diagnosis not present

## 2016-03-05 DIAGNOSIS — I10 Essential (primary) hypertension: Secondary | ICD-10-CM | POA: Diagnosis not present

## 2016-03-07 DIAGNOSIS — I69320 Aphasia following cerebral infarction: Secondary | ICD-10-CM | POA: Diagnosis not present

## 2016-03-07 DIAGNOSIS — I10 Essential (primary) hypertension: Secondary | ICD-10-CM | POA: Diagnosis not present

## 2016-03-07 DIAGNOSIS — R41841 Cognitive communication deficit: Secondary | ICD-10-CM | POA: Diagnosis not present

## 2016-03-07 DIAGNOSIS — E119 Type 2 diabetes mellitus without complications: Secondary | ICD-10-CM | POA: Diagnosis not present

## 2016-03-07 DIAGNOSIS — M1009 Idiopathic gout, multiple sites: Secondary | ICD-10-CM | POA: Diagnosis not present

## 2016-03-07 DIAGNOSIS — M6281 Muscle weakness (generalized): Secondary | ICD-10-CM | POA: Diagnosis not present

## 2016-03-07 DIAGNOSIS — I69354 Hemiplegia and hemiparesis following cerebral infarction affecting left non-dominant side: Secondary | ICD-10-CM | POA: Diagnosis not present

## 2016-03-08 DIAGNOSIS — M1009 Idiopathic gout, multiple sites: Secondary | ICD-10-CM | POA: Diagnosis not present

## 2016-03-08 DIAGNOSIS — M6281 Muscle weakness (generalized): Secondary | ICD-10-CM | POA: Diagnosis not present

## 2016-03-08 DIAGNOSIS — R41841 Cognitive communication deficit: Secondary | ICD-10-CM | POA: Diagnosis not present

## 2016-03-08 DIAGNOSIS — I69320 Aphasia following cerebral infarction: Secondary | ICD-10-CM | POA: Diagnosis not present

## 2016-03-08 DIAGNOSIS — I69354 Hemiplegia and hemiparesis following cerebral infarction affecting left non-dominant side: Secondary | ICD-10-CM | POA: Diagnosis not present

## 2016-03-08 DIAGNOSIS — I10 Essential (primary) hypertension: Secondary | ICD-10-CM | POA: Diagnosis not present

## 2016-03-10 DIAGNOSIS — I69320 Aphasia following cerebral infarction: Secondary | ICD-10-CM | POA: Diagnosis not present

## 2016-03-10 DIAGNOSIS — I69354 Hemiplegia and hemiparesis following cerebral infarction affecting left non-dominant side: Secondary | ICD-10-CM | POA: Diagnosis not present

## 2016-03-10 DIAGNOSIS — M6281 Muscle weakness (generalized): Secondary | ICD-10-CM | POA: Diagnosis not present

## 2016-03-10 DIAGNOSIS — M1009 Idiopathic gout, multiple sites: Secondary | ICD-10-CM | POA: Diagnosis not present

## 2016-03-10 DIAGNOSIS — I10 Essential (primary) hypertension: Secondary | ICD-10-CM | POA: Diagnosis not present

## 2016-03-10 DIAGNOSIS — R41841 Cognitive communication deficit: Secondary | ICD-10-CM | POA: Diagnosis not present

## 2016-03-11 DIAGNOSIS — I69320 Aphasia following cerebral infarction: Secondary | ICD-10-CM | POA: Diagnosis not present

## 2016-03-11 DIAGNOSIS — M1009 Idiopathic gout, multiple sites: Secondary | ICD-10-CM | POA: Diagnosis not present

## 2016-03-11 DIAGNOSIS — I10 Essential (primary) hypertension: Secondary | ICD-10-CM | POA: Diagnosis not present

## 2016-03-11 DIAGNOSIS — R41841 Cognitive communication deficit: Secondary | ICD-10-CM | POA: Diagnosis not present

## 2016-03-11 DIAGNOSIS — M6281 Muscle weakness (generalized): Secondary | ICD-10-CM | POA: Diagnosis not present

## 2016-03-11 DIAGNOSIS — I69354 Hemiplegia and hemiparesis following cerebral infarction affecting left non-dominant side: Secondary | ICD-10-CM | POA: Diagnosis not present

## 2016-03-14 DIAGNOSIS — R41841 Cognitive communication deficit: Secondary | ICD-10-CM | POA: Diagnosis not present

## 2016-03-14 DIAGNOSIS — I69354 Hemiplegia and hemiparesis following cerebral infarction affecting left non-dominant side: Secondary | ICD-10-CM | POA: Diagnosis not present

## 2016-03-14 DIAGNOSIS — M6281 Muscle weakness (generalized): Secondary | ICD-10-CM | POA: Diagnosis not present

## 2016-03-14 DIAGNOSIS — I10 Essential (primary) hypertension: Secondary | ICD-10-CM | POA: Diagnosis not present

## 2016-03-14 DIAGNOSIS — I69320 Aphasia following cerebral infarction: Secondary | ICD-10-CM | POA: Diagnosis not present

## 2016-03-14 DIAGNOSIS — M1009 Idiopathic gout, multiple sites: Secondary | ICD-10-CM | POA: Diagnosis not present

## 2016-03-15 DIAGNOSIS — I69320 Aphasia following cerebral infarction: Secondary | ICD-10-CM | POA: Diagnosis not present

## 2016-03-15 DIAGNOSIS — I10 Essential (primary) hypertension: Secondary | ICD-10-CM | POA: Diagnosis not present

## 2016-03-15 DIAGNOSIS — R41841 Cognitive communication deficit: Secondary | ICD-10-CM | POA: Diagnosis not present

## 2016-03-15 DIAGNOSIS — M1009 Idiopathic gout, multiple sites: Secondary | ICD-10-CM | POA: Diagnosis not present

## 2016-03-15 DIAGNOSIS — I69354 Hemiplegia and hemiparesis following cerebral infarction affecting left non-dominant side: Secondary | ICD-10-CM | POA: Diagnosis not present

## 2016-03-15 DIAGNOSIS — M6281 Muscle weakness (generalized): Secondary | ICD-10-CM | POA: Diagnosis not present

## 2016-03-17 DIAGNOSIS — I69354 Hemiplegia and hemiparesis following cerebral infarction affecting left non-dominant side: Secondary | ICD-10-CM | POA: Diagnosis not present

## 2016-03-17 DIAGNOSIS — R41841 Cognitive communication deficit: Secondary | ICD-10-CM | POA: Diagnosis not present

## 2016-03-17 DIAGNOSIS — I69320 Aphasia following cerebral infarction: Secondary | ICD-10-CM | POA: Diagnosis not present

## 2016-03-17 DIAGNOSIS — M6281 Muscle weakness (generalized): Secondary | ICD-10-CM | POA: Diagnosis not present

## 2016-03-17 DIAGNOSIS — I10 Essential (primary) hypertension: Secondary | ICD-10-CM | POA: Diagnosis not present

## 2016-03-17 DIAGNOSIS — M1009 Idiopathic gout, multiple sites: Secondary | ICD-10-CM | POA: Diagnosis not present

## 2016-03-18 DIAGNOSIS — I69354 Hemiplegia and hemiparesis following cerebral infarction affecting left non-dominant side: Secondary | ICD-10-CM | POA: Diagnosis not present

## 2016-03-18 DIAGNOSIS — M6281 Muscle weakness (generalized): Secondary | ICD-10-CM | POA: Diagnosis not present

## 2016-03-18 DIAGNOSIS — R41841 Cognitive communication deficit: Secondary | ICD-10-CM | POA: Diagnosis not present

## 2016-03-18 DIAGNOSIS — I10 Essential (primary) hypertension: Secondary | ICD-10-CM | POA: Diagnosis not present

## 2016-03-18 DIAGNOSIS — I69320 Aphasia following cerebral infarction: Secondary | ICD-10-CM | POA: Diagnosis not present

## 2016-03-18 DIAGNOSIS — M1009 Idiopathic gout, multiple sites: Secondary | ICD-10-CM | POA: Diagnosis not present

## 2016-03-20 DIAGNOSIS — M6281 Muscle weakness (generalized): Secondary | ICD-10-CM | POA: Diagnosis not present

## 2016-03-20 DIAGNOSIS — I69320 Aphasia following cerebral infarction: Secondary | ICD-10-CM | POA: Diagnosis not present

## 2016-03-20 DIAGNOSIS — M1009 Idiopathic gout, multiple sites: Secondary | ICD-10-CM | POA: Diagnosis not present

## 2016-03-20 DIAGNOSIS — I69354 Hemiplegia and hemiparesis following cerebral infarction affecting left non-dominant side: Secondary | ICD-10-CM | POA: Diagnosis not present

## 2016-03-20 DIAGNOSIS — I10 Essential (primary) hypertension: Secondary | ICD-10-CM | POA: Diagnosis not present

## 2016-03-20 DIAGNOSIS — R41841 Cognitive communication deficit: Secondary | ICD-10-CM | POA: Diagnosis not present

## 2016-03-22 DIAGNOSIS — M1009 Idiopathic gout, multiple sites: Secondary | ICD-10-CM | POA: Diagnosis not present

## 2016-03-22 DIAGNOSIS — I69320 Aphasia following cerebral infarction: Secondary | ICD-10-CM | POA: Diagnosis not present

## 2016-03-22 DIAGNOSIS — I69354 Hemiplegia and hemiparesis following cerebral infarction affecting left non-dominant side: Secondary | ICD-10-CM | POA: Diagnosis not present

## 2016-03-22 DIAGNOSIS — R41841 Cognitive communication deficit: Secondary | ICD-10-CM | POA: Diagnosis not present

## 2016-03-22 DIAGNOSIS — M6281 Muscle weakness (generalized): Secondary | ICD-10-CM | POA: Diagnosis not present

## 2016-03-22 DIAGNOSIS — I10 Essential (primary) hypertension: Secondary | ICD-10-CM | POA: Diagnosis not present

## 2016-03-23 DIAGNOSIS — R41841 Cognitive communication deficit: Secondary | ICD-10-CM | POA: Diagnosis not present

## 2016-03-23 DIAGNOSIS — M6281 Muscle weakness (generalized): Secondary | ICD-10-CM | POA: Diagnosis not present

## 2016-03-23 DIAGNOSIS — I69354 Hemiplegia and hemiparesis following cerebral infarction affecting left non-dominant side: Secondary | ICD-10-CM | POA: Diagnosis not present

## 2016-03-23 DIAGNOSIS — M1009 Idiopathic gout, multiple sites: Secondary | ICD-10-CM | POA: Diagnosis not present

## 2016-03-23 DIAGNOSIS — I69320 Aphasia following cerebral infarction: Secondary | ICD-10-CM | POA: Diagnosis not present

## 2016-03-23 DIAGNOSIS — I10 Essential (primary) hypertension: Secondary | ICD-10-CM | POA: Diagnosis not present

## 2016-03-24 DIAGNOSIS — M1009 Idiopathic gout, multiple sites: Secondary | ICD-10-CM | POA: Diagnosis not present

## 2016-03-24 DIAGNOSIS — M6281 Muscle weakness (generalized): Secondary | ICD-10-CM | POA: Diagnosis not present

## 2016-03-24 DIAGNOSIS — I10 Essential (primary) hypertension: Secondary | ICD-10-CM | POA: Diagnosis not present

## 2016-03-24 DIAGNOSIS — R41841 Cognitive communication deficit: Secondary | ICD-10-CM | POA: Diagnosis not present

## 2016-03-24 DIAGNOSIS — I69354 Hemiplegia and hemiparesis following cerebral infarction affecting left non-dominant side: Secondary | ICD-10-CM | POA: Diagnosis not present

## 2016-03-24 DIAGNOSIS — I69320 Aphasia following cerebral infarction: Secondary | ICD-10-CM | POA: Diagnosis not present

## 2016-04-01 DIAGNOSIS — M6281 Muscle weakness (generalized): Secondary | ICD-10-CM | POA: Diagnosis not present

## 2016-04-01 DIAGNOSIS — I69354 Hemiplegia and hemiparesis following cerebral infarction affecting left non-dominant side: Secondary | ICD-10-CM | POA: Diagnosis not present

## 2016-04-01 DIAGNOSIS — M1009 Idiopathic gout, multiple sites: Secondary | ICD-10-CM | POA: Diagnosis not present

## 2016-04-01 DIAGNOSIS — I10 Essential (primary) hypertension: Secondary | ICD-10-CM | POA: Diagnosis not present

## 2016-04-01 DIAGNOSIS — I69320 Aphasia following cerebral infarction: Secondary | ICD-10-CM | POA: Diagnosis not present

## 2016-04-01 DIAGNOSIS — R41841 Cognitive communication deficit: Secondary | ICD-10-CM | POA: Diagnosis not present

## 2016-04-04 DIAGNOSIS — Z79899 Other long term (current) drug therapy: Secondary | ICD-10-CM | POA: Diagnosis not present

## 2016-04-05 DIAGNOSIS — R41841 Cognitive communication deficit: Secondary | ICD-10-CM | POA: Diagnosis not present

## 2016-04-05 DIAGNOSIS — I10 Essential (primary) hypertension: Secondary | ICD-10-CM | POA: Diagnosis not present

## 2016-04-05 DIAGNOSIS — M6281 Muscle weakness (generalized): Secondary | ICD-10-CM | POA: Diagnosis not present

## 2016-04-05 DIAGNOSIS — M1009 Idiopathic gout, multiple sites: Secondary | ICD-10-CM | POA: Diagnosis not present

## 2016-04-05 DIAGNOSIS — I69320 Aphasia following cerebral infarction: Secondary | ICD-10-CM | POA: Diagnosis not present

## 2016-04-05 DIAGNOSIS — I69354 Hemiplegia and hemiparesis following cerebral infarction affecting left non-dominant side: Secondary | ICD-10-CM | POA: Diagnosis not present

## 2016-04-07 DIAGNOSIS — E119 Type 2 diabetes mellitus without complications: Secondary | ICD-10-CM | POA: Diagnosis not present

## 2016-04-07 DIAGNOSIS — I69959 Hemiplegia and hemiparesis following unspecified cerebrovascular disease affecting unspecified side: Secondary | ICD-10-CM | POA: Diagnosis not present

## 2016-04-07 DIAGNOSIS — M6281 Muscle weakness (generalized): Secondary | ICD-10-CM | POA: Diagnosis not present

## 2016-04-07 DIAGNOSIS — I69320 Aphasia following cerebral infarction: Secondary | ICD-10-CM | POA: Diagnosis not present

## 2016-04-07 DIAGNOSIS — I69354 Hemiplegia and hemiparesis following cerebral infarction affecting left non-dominant side: Secondary | ICD-10-CM | POA: Diagnosis not present

## 2016-04-07 DIAGNOSIS — R001 Bradycardia, unspecified: Secondary | ICD-10-CM | POA: Diagnosis not present

## 2016-04-07 DIAGNOSIS — R41841 Cognitive communication deficit: Secondary | ICD-10-CM | POA: Diagnosis not present

## 2016-04-07 DIAGNOSIS — I10 Essential (primary) hypertension: Secondary | ICD-10-CM | POA: Diagnosis not present

## 2016-04-07 DIAGNOSIS — M1009 Idiopathic gout, multiple sites: Secondary | ICD-10-CM | POA: Diagnosis not present

## 2016-04-08 DIAGNOSIS — I69320 Aphasia following cerebral infarction: Secondary | ICD-10-CM | POA: Diagnosis not present

## 2016-04-08 DIAGNOSIS — I69354 Hemiplegia and hemiparesis following cerebral infarction affecting left non-dominant side: Secondary | ICD-10-CM | POA: Diagnosis not present

## 2016-04-08 DIAGNOSIS — M1009 Idiopathic gout, multiple sites: Secondary | ICD-10-CM | POA: Diagnosis not present

## 2016-04-08 DIAGNOSIS — M6281 Muscle weakness (generalized): Secondary | ICD-10-CM | POA: Diagnosis not present

## 2016-04-08 DIAGNOSIS — I10 Essential (primary) hypertension: Secondary | ICD-10-CM | POA: Diagnosis not present

## 2016-04-08 DIAGNOSIS — R41841 Cognitive communication deficit: Secondary | ICD-10-CM | POA: Diagnosis not present

## 2016-04-09 DIAGNOSIS — M1009 Idiopathic gout, multiple sites: Secondary | ICD-10-CM | POA: Diagnosis not present

## 2016-04-09 DIAGNOSIS — I69320 Aphasia following cerebral infarction: Secondary | ICD-10-CM | POA: Diagnosis not present

## 2016-04-09 DIAGNOSIS — I69354 Hemiplegia and hemiparesis following cerebral infarction affecting left non-dominant side: Secondary | ICD-10-CM | POA: Diagnosis not present

## 2016-04-09 DIAGNOSIS — I10 Essential (primary) hypertension: Secondary | ICD-10-CM | POA: Diagnosis not present

## 2016-04-09 DIAGNOSIS — M6281 Muscle weakness (generalized): Secondary | ICD-10-CM | POA: Diagnosis not present

## 2016-04-09 DIAGNOSIS — R41841 Cognitive communication deficit: Secondary | ICD-10-CM | POA: Diagnosis not present

## 2016-04-11 DIAGNOSIS — I10 Essential (primary) hypertension: Secondary | ICD-10-CM | POA: Diagnosis not present

## 2016-04-11 DIAGNOSIS — M6281 Muscle weakness (generalized): Secondary | ICD-10-CM | POA: Diagnosis not present

## 2016-04-11 DIAGNOSIS — M1009 Idiopathic gout, multiple sites: Secondary | ICD-10-CM | POA: Diagnosis not present

## 2016-04-11 DIAGNOSIS — I69320 Aphasia following cerebral infarction: Secondary | ICD-10-CM | POA: Diagnosis not present

## 2016-04-11 DIAGNOSIS — R41841 Cognitive communication deficit: Secondary | ICD-10-CM | POA: Diagnosis not present

## 2016-04-11 DIAGNOSIS — I69354 Hemiplegia and hemiparesis following cerebral infarction affecting left non-dominant side: Secondary | ICD-10-CM | POA: Diagnosis not present

## 2016-04-12 DIAGNOSIS — R41841 Cognitive communication deficit: Secondary | ICD-10-CM | POA: Diagnosis not present

## 2016-04-12 DIAGNOSIS — M6281 Muscle weakness (generalized): Secondary | ICD-10-CM | POA: Diagnosis not present

## 2016-04-12 DIAGNOSIS — I10 Essential (primary) hypertension: Secondary | ICD-10-CM | POA: Diagnosis not present

## 2016-04-12 DIAGNOSIS — M1009 Idiopathic gout, multiple sites: Secondary | ICD-10-CM | POA: Diagnosis not present

## 2016-04-12 DIAGNOSIS — I69354 Hemiplegia and hemiparesis following cerebral infarction affecting left non-dominant side: Secondary | ICD-10-CM | POA: Diagnosis not present

## 2016-04-12 DIAGNOSIS — I69320 Aphasia following cerebral infarction: Secondary | ICD-10-CM | POA: Diagnosis not present

## 2016-04-13 DIAGNOSIS — M6281 Muscle weakness (generalized): Secondary | ICD-10-CM | POA: Diagnosis not present

## 2016-04-13 DIAGNOSIS — I69320 Aphasia following cerebral infarction: Secondary | ICD-10-CM | POA: Diagnosis not present

## 2016-04-13 DIAGNOSIS — I69354 Hemiplegia and hemiparesis following cerebral infarction affecting left non-dominant side: Secondary | ICD-10-CM | POA: Diagnosis not present

## 2016-04-13 DIAGNOSIS — I10 Essential (primary) hypertension: Secondary | ICD-10-CM | POA: Diagnosis not present

## 2016-04-13 DIAGNOSIS — M1009 Idiopathic gout, multiple sites: Secondary | ICD-10-CM | POA: Diagnosis not present

## 2016-04-13 DIAGNOSIS — R41841 Cognitive communication deficit: Secondary | ICD-10-CM | POA: Diagnosis not present

## 2016-04-14 DIAGNOSIS — I69354 Hemiplegia and hemiparesis following cerebral infarction affecting left non-dominant side: Secondary | ICD-10-CM | POA: Diagnosis not present

## 2016-04-14 DIAGNOSIS — R41841 Cognitive communication deficit: Secondary | ICD-10-CM | POA: Diagnosis not present

## 2016-04-14 DIAGNOSIS — M1009 Idiopathic gout, multiple sites: Secondary | ICD-10-CM | POA: Diagnosis not present

## 2016-04-14 DIAGNOSIS — I69320 Aphasia following cerebral infarction: Secondary | ICD-10-CM | POA: Diagnosis not present

## 2016-04-14 DIAGNOSIS — I10 Essential (primary) hypertension: Secondary | ICD-10-CM | POA: Diagnosis not present

## 2016-04-14 DIAGNOSIS — M6281 Muscle weakness (generalized): Secondary | ICD-10-CM | POA: Diagnosis not present

## 2016-04-15 DIAGNOSIS — M1009 Idiopathic gout, multiple sites: Secondary | ICD-10-CM | POA: Diagnosis not present

## 2016-04-15 DIAGNOSIS — M6281 Muscle weakness (generalized): Secondary | ICD-10-CM | POA: Diagnosis not present

## 2016-04-15 DIAGNOSIS — I69320 Aphasia following cerebral infarction: Secondary | ICD-10-CM | POA: Diagnosis not present

## 2016-04-15 DIAGNOSIS — I10 Essential (primary) hypertension: Secondary | ICD-10-CM | POA: Diagnosis not present

## 2016-04-15 DIAGNOSIS — I69354 Hemiplegia and hemiparesis following cerebral infarction affecting left non-dominant side: Secondary | ICD-10-CM | POA: Diagnosis not present

## 2016-04-15 DIAGNOSIS — R41841 Cognitive communication deficit: Secondary | ICD-10-CM | POA: Diagnosis not present

## 2016-05-04 DIAGNOSIS — Z79899 Other long term (current) drug therapy: Secondary | ICD-10-CM | POA: Diagnosis not present

## 2016-05-20 DIAGNOSIS — M109 Gout, unspecified: Secondary | ICD-10-CM | POA: Diagnosis not present

## 2016-05-20 DIAGNOSIS — Z79899 Other long term (current) drug therapy: Secondary | ICD-10-CM | POA: Diagnosis not present

## 2016-05-20 DIAGNOSIS — E0829 Diabetes mellitus due to underlying condition with other diabetic kidney complication: Secondary | ICD-10-CM | POA: Diagnosis not present

## 2016-05-20 DIAGNOSIS — E785 Hyperlipidemia, unspecified: Secondary | ICD-10-CM | POA: Diagnosis not present

## 2016-05-23 DIAGNOSIS — M6281 Muscle weakness (generalized): Secondary | ICD-10-CM | POA: Diagnosis not present

## 2016-05-23 DIAGNOSIS — R41841 Cognitive communication deficit: Secondary | ICD-10-CM | POA: Diagnosis not present

## 2016-05-23 DIAGNOSIS — I69320 Aphasia following cerebral infarction: Secondary | ICD-10-CM | POA: Diagnosis not present

## 2016-05-23 DIAGNOSIS — M1009 Idiopathic gout, multiple sites: Secondary | ICD-10-CM | POA: Diagnosis not present

## 2016-05-23 DIAGNOSIS — E119 Type 2 diabetes mellitus without complications: Secondary | ICD-10-CM | POA: Diagnosis not present

## 2016-05-23 DIAGNOSIS — I10 Essential (primary) hypertension: Secondary | ICD-10-CM | POA: Diagnosis not present

## 2016-05-23 DIAGNOSIS — I69354 Hemiplegia and hemiparesis following cerebral infarction affecting left non-dominant side: Secondary | ICD-10-CM | POA: Diagnosis not present

## 2016-05-24 DIAGNOSIS — I69354 Hemiplegia and hemiparesis following cerebral infarction affecting left non-dominant side: Secondary | ICD-10-CM | POA: Diagnosis not present

## 2016-05-24 DIAGNOSIS — I10 Essential (primary) hypertension: Secondary | ICD-10-CM | POA: Diagnosis not present

## 2016-05-24 DIAGNOSIS — R41841 Cognitive communication deficit: Secondary | ICD-10-CM | POA: Diagnosis not present

## 2016-05-24 DIAGNOSIS — I69320 Aphasia following cerebral infarction: Secondary | ICD-10-CM | POA: Diagnosis not present

## 2016-05-24 DIAGNOSIS — M1009 Idiopathic gout, multiple sites: Secondary | ICD-10-CM | POA: Diagnosis not present

## 2016-05-24 DIAGNOSIS — M6281 Muscle weakness (generalized): Secondary | ICD-10-CM | POA: Diagnosis not present

## 2016-05-25 DIAGNOSIS — I10 Essential (primary) hypertension: Secondary | ICD-10-CM | POA: Diagnosis not present

## 2016-05-25 DIAGNOSIS — R41841 Cognitive communication deficit: Secondary | ICD-10-CM | POA: Diagnosis not present

## 2016-05-25 DIAGNOSIS — M6281 Muscle weakness (generalized): Secondary | ICD-10-CM | POA: Diagnosis not present

## 2016-05-25 DIAGNOSIS — I69354 Hemiplegia and hemiparesis following cerebral infarction affecting left non-dominant side: Secondary | ICD-10-CM | POA: Diagnosis not present

## 2016-05-25 DIAGNOSIS — I69320 Aphasia following cerebral infarction: Secondary | ICD-10-CM | POA: Diagnosis not present

## 2016-05-25 DIAGNOSIS — M1009 Idiopathic gout, multiple sites: Secondary | ICD-10-CM | POA: Diagnosis not present

## 2016-05-26 DIAGNOSIS — R41841 Cognitive communication deficit: Secondary | ICD-10-CM | POA: Diagnosis not present

## 2016-05-26 DIAGNOSIS — I69354 Hemiplegia and hemiparesis following cerebral infarction affecting left non-dominant side: Secondary | ICD-10-CM | POA: Diagnosis not present

## 2016-05-26 DIAGNOSIS — I69320 Aphasia following cerebral infarction: Secondary | ICD-10-CM | POA: Diagnosis not present

## 2016-05-26 DIAGNOSIS — M1009 Idiopathic gout, multiple sites: Secondary | ICD-10-CM | POA: Diagnosis not present

## 2016-05-26 DIAGNOSIS — M6281 Muscle weakness (generalized): Secondary | ICD-10-CM | POA: Diagnosis not present

## 2016-05-26 DIAGNOSIS — I10 Essential (primary) hypertension: Secondary | ICD-10-CM | POA: Diagnosis not present

## 2016-05-27 DIAGNOSIS — I69320 Aphasia following cerebral infarction: Secondary | ICD-10-CM | POA: Diagnosis not present

## 2016-05-27 DIAGNOSIS — R41841 Cognitive communication deficit: Secondary | ICD-10-CM | POA: Diagnosis not present

## 2016-05-27 DIAGNOSIS — I10 Essential (primary) hypertension: Secondary | ICD-10-CM | POA: Diagnosis not present

## 2016-05-27 DIAGNOSIS — M6281 Muscle weakness (generalized): Secondary | ICD-10-CM | POA: Diagnosis not present

## 2016-05-27 DIAGNOSIS — M1009 Idiopathic gout, multiple sites: Secondary | ICD-10-CM | POA: Diagnosis not present

## 2016-05-27 DIAGNOSIS — I69354 Hemiplegia and hemiparesis following cerebral infarction affecting left non-dominant side: Secondary | ICD-10-CM | POA: Diagnosis not present

## 2016-05-30 DIAGNOSIS — I69320 Aphasia following cerebral infarction: Secondary | ICD-10-CM | POA: Diagnosis not present

## 2016-05-30 DIAGNOSIS — M1009 Idiopathic gout, multiple sites: Secondary | ICD-10-CM | POA: Diagnosis not present

## 2016-05-30 DIAGNOSIS — R41841 Cognitive communication deficit: Secondary | ICD-10-CM | POA: Diagnosis not present

## 2016-05-30 DIAGNOSIS — I69354 Hemiplegia and hemiparesis following cerebral infarction affecting left non-dominant side: Secondary | ICD-10-CM | POA: Diagnosis not present

## 2016-05-30 DIAGNOSIS — M6281 Muscle weakness (generalized): Secondary | ICD-10-CM | POA: Diagnosis not present

## 2016-05-30 DIAGNOSIS — I10 Essential (primary) hypertension: Secondary | ICD-10-CM | POA: Diagnosis not present

## 2016-05-31 DIAGNOSIS — R41841 Cognitive communication deficit: Secondary | ICD-10-CM | POA: Diagnosis not present

## 2016-05-31 DIAGNOSIS — M1009 Idiopathic gout, multiple sites: Secondary | ICD-10-CM | POA: Diagnosis not present

## 2016-05-31 DIAGNOSIS — M6281 Muscle weakness (generalized): Secondary | ICD-10-CM | POA: Diagnosis not present

## 2016-05-31 DIAGNOSIS — I69354 Hemiplegia and hemiparesis following cerebral infarction affecting left non-dominant side: Secondary | ICD-10-CM | POA: Diagnosis not present

## 2016-05-31 DIAGNOSIS — I10 Essential (primary) hypertension: Secondary | ICD-10-CM | POA: Diagnosis not present

## 2016-05-31 DIAGNOSIS — I69320 Aphasia following cerebral infarction: Secondary | ICD-10-CM | POA: Diagnosis not present

## 2016-06-01 DIAGNOSIS — Z79899 Other long term (current) drug therapy: Secondary | ICD-10-CM | POA: Diagnosis not present

## 2016-06-02 DIAGNOSIS — I69354 Hemiplegia and hemiparesis following cerebral infarction affecting left non-dominant side: Secondary | ICD-10-CM | POA: Diagnosis not present

## 2016-06-02 DIAGNOSIS — M1009 Idiopathic gout, multiple sites: Secondary | ICD-10-CM | POA: Diagnosis not present

## 2016-06-02 DIAGNOSIS — I69320 Aphasia following cerebral infarction: Secondary | ICD-10-CM | POA: Diagnosis not present

## 2016-06-02 DIAGNOSIS — I10 Essential (primary) hypertension: Secondary | ICD-10-CM | POA: Diagnosis not present

## 2016-06-02 DIAGNOSIS — R41841 Cognitive communication deficit: Secondary | ICD-10-CM | POA: Diagnosis not present

## 2016-06-02 DIAGNOSIS — M6281 Muscle weakness (generalized): Secondary | ICD-10-CM | POA: Diagnosis not present

## 2016-06-06 DIAGNOSIS — I10 Essential (primary) hypertension: Secondary | ICD-10-CM | POA: Diagnosis not present

## 2016-06-06 DIAGNOSIS — M1009 Idiopathic gout, multiple sites: Secondary | ICD-10-CM | POA: Diagnosis not present

## 2016-06-06 DIAGNOSIS — M6281 Muscle weakness (generalized): Secondary | ICD-10-CM | POA: Diagnosis not present

## 2016-06-06 DIAGNOSIS — E119 Type 2 diabetes mellitus without complications: Secondary | ICD-10-CM | POA: Diagnosis not present

## 2016-06-06 DIAGNOSIS — R41841 Cognitive communication deficit: Secondary | ICD-10-CM | POA: Diagnosis not present

## 2016-06-06 DIAGNOSIS — I69354 Hemiplegia and hemiparesis following cerebral infarction affecting left non-dominant side: Secondary | ICD-10-CM | POA: Diagnosis not present

## 2016-06-06 DIAGNOSIS — I69320 Aphasia following cerebral infarction: Secondary | ICD-10-CM | POA: Diagnosis not present

## 2016-06-07 DIAGNOSIS — I739 Peripheral vascular disease, unspecified: Secondary | ICD-10-CM | POA: Diagnosis not present

## 2016-06-07 DIAGNOSIS — B351 Tinea unguium: Secondary | ICD-10-CM | POA: Diagnosis not present

## 2016-06-07 DIAGNOSIS — I69354 Hemiplegia and hemiparesis following cerebral infarction affecting left non-dominant side: Secondary | ICD-10-CM | POA: Diagnosis not present

## 2016-06-07 DIAGNOSIS — R41841 Cognitive communication deficit: Secondary | ICD-10-CM | POA: Diagnosis not present

## 2016-06-07 DIAGNOSIS — I70203 Unspecified atherosclerosis of native arteries of extremities, bilateral legs: Secondary | ICD-10-CM | POA: Diagnosis not present

## 2016-06-07 DIAGNOSIS — E1142 Type 2 diabetes mellitus with diabetic polyneuropathy: Secondary | ICD-10-CM | POA: Diagnosis not present

## 2016-06-07 DIAGNOSIS — M1009 Idiopathic gout, multiple sites: Secondary | ICD-10-CM | POA: Diagnosis not present

## 2016-06-07 DIAGNOSIS — I10 Essential (primary) hypertension: Secondary | ICD-10-CM | POA: Diagnosis not present

## 2016-06-07 DIAGNOSIS — M6281 Muscle weakness (generalized): Secondary | ICD-10-CM | POA: Diagnosis not present

## 2016-06-07 DIAGNOSIS — I69320 Aphasia following cerebral infarction: Secondary | ICD-10-CM | POA: Diagnosis not present

## 2016-06-10 DIAGNOSIS — I10 Essential (primary) hypertension: Secondary | ICD-10-CM | POA: Diagnosis not present

## 2016-06-10 DIAGNOSIS — I69320 Aphasia following cerebral infarction: Secondary | ICD-10-CM | POA: Diagnosis not present

## 2016-06-10 DIAGNOSIS — I69354 Hemiplegia and hemiparesis following cerebral infarction affecting left non-dominant side: Secondary | ICD-10-CM | POA: Diagnosis not present

## 2016-06-10 DIAGNOSIS — M1009 Idiopathic gout, multiple sites: Secondary | ICD-10-CM | POA: Diagnosis not present

## 2016-06-10 DIAGNOSIS — M6281 Muscle weakness (generalized): Secondary | ICD-10-CM | POA: Diagnosis not present

## 2016-06-10 DIAGNOSIS — R41841 Cognitive communication deficit: Secondary | ICD-10-CM | POA: Diagnosis not present

## 2016-06-14 DIAGNOSIS — I69320 Aphasia following cerebral infarction: Secondary | ICD-10-CM | POA: Diagnosis not present

## 2016-06-14 DIAGNOSIS — M6281 Muscle weakness (generalized): Secondary | ICD-10-CM | POA: Diagnosis not present

## 2016-06-14 DIAGNOSIS — R001 Bradycardia, unspecified: Secondary | ICD-10-CM | POA: Diagnosis not present

## 2016-06-14 DIAGNOSIS — I69354 Hemiplegia and hemiparesis following cerebral infarction affecting left non-dominant side: Secondary | ICD-10-CM | POA: Diagnosis not present

## 2016-06-14 DIAGNOSIS — I638 Other cerebral infarction: Secondary | ICD-10-CM | POA: Diagnosis not present

## 2016-06-14 DIAGNOSIS — R41841 Cognitive communication deficit: Secondary | ICD-10-CM | POA: Diagnosis not present

## 2016-06-14 DIAGNOSIS — I69959 Hemiplegia and hemiparesis following unspecified cerebrovascular disease affecting unspecified side: Secondary | ICD-10-CM | POA: Diagnosis not present

## 2016-06-14 DIAGNOSIS — I10 Essential (primary) hypertension: Secondary | ICD-10-CM | POA: Diagnosis not present

## 2016-06-14 DIAGNOSIS — E0840 Diabetes mellitus due to underlying condition with diabetic neuropathy, unspecified: Secondary | ICD-10-CM | POA: Diagnosis not present

## 2016-06-14 DIAGNOSIS — M1009 Idiopathic gout, multiple sites: Secondary | ICD-10-CM | POA: Diagnosis not present

## 2016-06-14 DIAGNOSIS — I129 Hypertensive chronic kidney disease with stage 1 through stage 4 chronic kidney disease, or unspecified chronic kidney disease: Secondary | ICD-10-CM | POA: Diagnosis not present

## 2016-06-15 DIAGNOSIS — R41841 Cognitive communication deficit: Secondary | ICD-10-CM | POA: Diagnosis not present

## 2016-06-15 DIAGNOSIS — I69354 Hemiplegia and hemiparesis following cerebral infarction affecting left non-dominant side: Secondary | ICD-10-CM | POA: Diagnosis not present

## 2016-06-15 DIAGNOSIS — M1009 Idiopathic gout, multiple sites: Secondary | ICD-10-CM | POA: Diagnosis not present

## 2016-06-15 DIAGNOSIS — I10 Essential (primary) hypertension: Secondary | ICD-10-CM | POA: Diagnosis not present

## 2016-06-15 DIAGNOSIS — I69320 Aphasia following cerebral infarction: Secondary | ICD-10-CM | POA: Diagnosis not present

## 2016-06-15 DIAGNOSIS — M6281 Muscle weakness (generalized): Secondary | ICD-10-CM | POA: Diagnosis not present

## 2016-06-17 DIAGNOSIS — R41841 Cognitive communication deficit: Secondary | ICD-10-CM | POA: Diagnosis not present

## 2016-06-17 DIAGNOSIS — M1009 Idiopathic gout, multiple sites: Secondary | ICD-10-CM | POA: Diagnosis not present

## 2016-06-17 DIAGNOSIS — I10 Essential (primary) hypertension: Secondary | ICD-10-CM | POA: Diagnosis not present

## 2016-06-17 DIAGNOSIS — M6281 Muscle weakness (generalized): Secondary | ICD-10-CM | POA: Diagnosis not present

## 2016-06-17 DIAGNOSIS — I69320 Aphasia following cerebral infarction: Secondary | ICD-10-CM | POA: Diagnosis not present

## 2016-06-17 DIAGNOSIS — I69354 Hemiplegia and hemiparesis following cerebral infarction affecting left non-dominant side: Secondary | ICD-10-CM | POA: Diagnosis not present

## 2016-06-20 DIAGNOSIS — I69354 Hemiplegia and hemiparesis following cerebral infarction affecting left non-dominant side: Secondary | ICD-10-CM | POA: Diagnosis not present

## 2016-06-20 DIAGNOSIS — R41841 Cognitive communication deficit: Secondary | ICD-10-CM | POA: Diagnosis not present

## 2016-06-20 DIAGNOSIS — M1009 Idiopathic gout, multiple sites: Secondary | ICD-10-CM | POA: Diagnosis not present

## 2016-06-20 DIAGNOSIS — M6281 Muscle weakness (generalized): Secondary | ICD-10-CM | POA: Diagnosis not present

## 2016-06-20 DIAGNOSIS — I10 Essential (primary) hypertension: Secondary | ICD-10-CM | POA: Diagnosis not present

## 2016-06-20 DIAGNOSIS — I69320 Aphasia following cerebral infarction: Secondary | ICD-10-CM | POA: Diagnosis not present

## 2016-06-21 DIAGNOSIS — I69354 Hemiplegia and hemiparesis following cerebral infarction affecting left non-dominant side: Secondary | ICD-10-CM | POA: Diagnosis not present

## 2016-06-21 DIAGNOSIS — I10 Essential (primary) hypertension: Secondary | ICD-10-CM | POA: Diagnosis not present

## 2016-06-21 DIAGNOSIS — M6281 Muscle weakness (generalized): Secondary | ICD-10-CM | POA: Diagnosis not present

## 2016-06-21 DIAGNOSIS — R41841 Cognitive communication deficit: Secondary | ICD-10-CM | POA: Diagnosis not present

## 2016-06-21 DIAGNOSIS — I69320 Aphasia following cerebral infarction: Secondary | ICD-10-CM | POA: Diagnosis not present

## 2016-06-21 DIAGNOSIS — M1009 Idiopathic gout, multiple sites: Secondary | ICD-10-CM | POA: Diagnosis not present

## 2016-06-22 DIAGNOSIS — M1009 Idiopathic gout, multiple sites: Secondary | ICD-10-CM | POA: Diagnosis not present

## 2016-06-22 DIAGNOSIS — M6281 Muscle weakness (generalized): Secondary | ICD-10-CM | POA: Diagnosis not present

## 2016-06-22 DIAGNOSIS — I10 Essential (primary) hypertension: Secondary | ICD-10-CM | POA: Diagnosis not present

## 2016-06-22 DIAGNOSIS — I69320 Aphasia following cerebral infarction: Secondary | ICD-10-CM | POA: Diagnosis not present

## 2016-06-22 DIAGNOSIS — R41841 Cognitive communication deficit: Secondary | ICD-10-CM | POA: Diagnosis not present

## 2016-06-22 DIAGNOSIS — I69354 Hemiplegia and hemiparesis following cerebral infarction affecting left non-dominant side: Secondary | ICD-10-CM | POA: Diagnosis not present

## 2016-06-28 DIAGNOSIS — M1009 Idiopathic gout, multiple sites: Secondary | ICD-10-CM | POA: Diagnosis not present

## 2016-06-28 DIAGNOSIS — I10 Essential (primary) hypertension: Secondary | ICD-10-CM | POA: Diagnosis not present

## 2016-06-28 DIAGNOSIS — I69320 Aphasia following cerebral infarction: Secondary | ICD-10-CM | POA: Diagnosis not present

## 2016-06-28 DIAGNOSIS — M6281 Muscle weakness (generalized): Secondary | ICD-10-CM | POA: Diagnosis not present

## 2016-06-28 DIAGNOSIS — R41841 Cognitive communication deficit: Secondary | ICD-10-CM | POA: Diagnosis not present

## 2016-06-28 DIAGNOSIS — I69354 Hemiplegia and hemiparesis following cerebral infarction affecting left non-dominant side: Secondary | ICD-10-CM | POA: Diagnosis not present

## 2016-06-29 DIAGNOSIS — I69354 Hemiplegia and hemiparesis following cerebral infarction affecting left non-dominant side: Secondary | ICD-10-CM | POA: Diagnosis not present

## 2016-06-29 DIAGNOSIS — M1009 Idiopathic gout, multiple sites: Secondary | ICD-10-CM | POA: Diagnosis not present

## 2016-06-29 DIAGNOSIS — R41841 Cognitive communication deficit: Secondary | ICD-10-CM | POA: Diagnosis not present

## 2016-06-29 DIAGNOSIS — I10 Essential (primary) hypertension: Secondary | ICD-10-CM | POA: Diagnosis not present

## 2016-06-29 DIAGNOSIS — M6281 Muscle weakness (generalized): Secondary | ICD-10-CM | POA: Diagnosis not present

## 2016-06-29 DIAGNOSIS — I69320 Aphasia following cerebral infarction: Secondary | ICD-10-CM | POA: Diagnosis not present

## 2016-07-01 DIAGNOSIS — I69354 Hemiplegia and hemiparesis following cerebral infarction affecting left non-dominant side: Secondary | ICD-10-CM | POA: Diagnosis not present

## 2016-07-01 DIAGNOSIS — I10 Essential (primary) hypertension: Secondary | ICD-10-CM | POA: Diagnosis not present

## 2016-07-01 DIAGNOSIS — M1009 Idiopathic gout, multiple sites: Secondary | ICD-10-CM | POA: Diagnosis not present

## 2016-07-01 DIAGNOSIS — R41841 Cognitive communication deficit: Secondary | ICD-10-CM | POA: Diagnosis not present

## 2016-07-01 DIAGNOSIS — I69320 Aphasia following cerebral infarction: Secondary | ICD-10-CM | POA: Diagnosis not present

## 2016-07-01 DIAGNOSIS — M6281 Muscle weakness (generalized): Secondary | ICD-10-CM | POA: Diagnosis not present

## 2016-07-04 DIAGNOSIS — I69354 Hemiplegia and hemiparesis following cerebral infarction affecting left non-dominant side: Secondary | ICD-10-CM | POA: Diagnosis not present

## 2016-07-04 DIAGNOSIS — M6281 Muscle weakness (generalized): Secondary | ICD-10-CM | POA: Diagnosis not present

## 2016-07-04 DIAGNOSIS — I69320 Aphasia following cerebral infarction: Secondary | ICD-10-CM | POA: Diagnosis not present

## 2016-07-04 DIAGNOSIS — M1009 Idiopathic gout, multiple sites: Secondary | ICD-10-CM | POA: Diagnosis not present

## 2016-07-04 DIAGNOSIS — I10 Essential (primary) hypertension: Secondary | ICD-10-CM | POA: Diagnosis not present

## 2016-07-04 DIAGNOSIS — R41841 Cognitive communication deficit: Secondary | ICD-10-CM | POA: Diagnosis not present

## 2016-07-04 DIAGNOSIS — Z79899 Other long term (current) drug therapy: Secondary | ICD-10-CM | POA: Diagnosis not present

## 2016-07-05 DIAGNOSIS — M1009 Idiopathic gout, multiple sites: Secondary | ICD-10-CM | POA: Diagnosis not present

## 2016-07-05 DIAGNOSIS — I69320 Aphasia following cerebral infarction: Secondary | ICD-10-CM | POA: Diagnosis not present

## 2016-07-05 DIAGNOSIS — I10 Essential (primary) hypertension: Secondary | ICD-10-CM | POA: Diagnosis not present

## 2016-07-05 DIAGNOSIS — R41841 Cognitive communication deficit: Secondary | ICD-10-CM | POA: Diagnosis not present

## 2016-07-05 DIAGNOSIS — I69354 Hemiplegia and hemiparesis following cerebral infarction affecting left non-dominant side: Secondary | ICD-10-CM | POA: Diagnosis not present

## 2016-07-05 DIAGNOSIS — M6281 Muscle weakness (generalized): Secondary | ICD-10-CM | POA: Diagnosis not present

## 2016-07-05 DIAGNOSIS — E119 Type 2 diabetes mellitus without complications: Secondary | ICD-10-CM | POA: Diagnosis not present

## 2016-08-01 DIAGNOSIS — Z79899 Other long term (current) drug therapy: Secondary | ICD-10-CM | POA: Diagnosis not present

## 2016-08-16 DIAGNOSIS — I129 Hypertensive chronic kidney disease with stage 1 through stage 4 chronic kidney disease, or unspecified chronic kidney disease: Secondary | ICD-10-CM | POA: Diagnosis not present

## 2016-08-16 DIAGNOSIS — E119 Type 2 diabetes mellitus without complications: Secondary | ICD-10-CM | POA: Diagnosis not present

## 2016-08-16 DIAGNOSIS — E0840 Diabetes mellitus due to underlying condition with diabetic neuropathy, unspecified: Secondary | ICD-10-CM | POA: Diagnosis not present

## 2016-08-16 DIAGNOSIS — I69898 Other sequelae of other cerebrovascular disease: Secondary | ICD-10-CM | POA: Diagnosis not present

## 2016-08-16 DIAGNOSIS — I69959 Hemiplegia and hemiparesis following unspecified cerebrovascular disease affecting unspecified side: Secondary | ICD-10-CM | POA: Diagnosis not present

## 2016-08-16 DIAGNOSIS — R001 Bradycardia, unspecified: Secondary | ICD-10-CM | POA: Diagnosis not present

## 2016-08-22 DIAGNOSIS — E0829 Diabetes mellitus due to underlying condition with other diabetic kidney complication: Secondary | ICD-10-CM | POA: Diagnosis not present

## 2016-08-22 DIAGNOSIS — E785 Hyperlipidemia, unspecified: Secondary | ICD-10-CM | POA: Diagnosis not present

## 2016-09-01 DIAGNOSIS — Z79899 Other long term (current) drug therapy: Secondary | ICD-10-CM | POA: Diagnosis not present

## 2016-09-13 IMAGING — DX DG CHEST 2V
2 series · 2 of 2 positions shown · non-contrast
Comparison: None.

CLINICAL DATA: Fell twice today. Late on ground for 1 hour. Leans
to the left. Slurred speech this morning. Elevated blood pressure.

EXAM:
CHEST  2 VIEW

[chest lat]
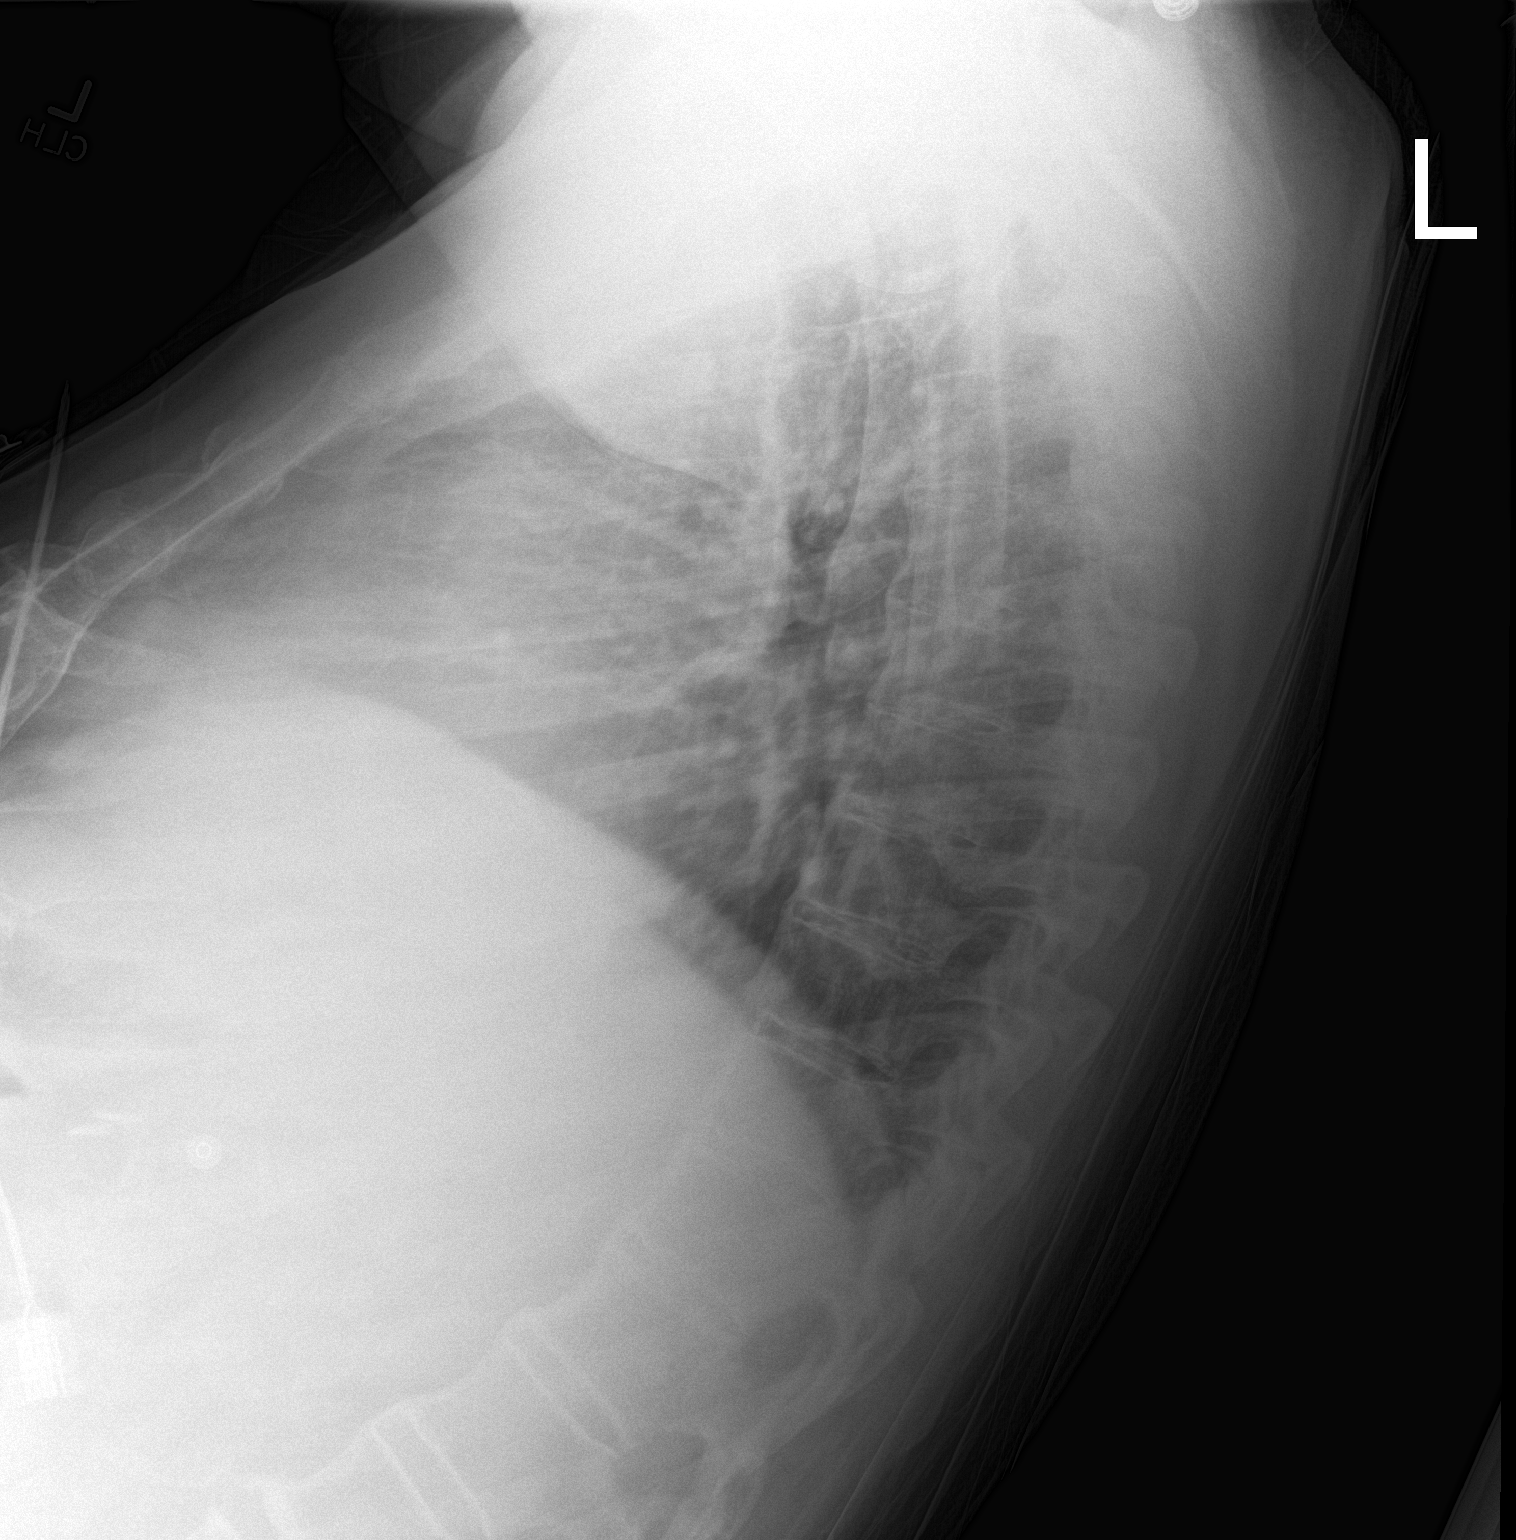

[chest ap]
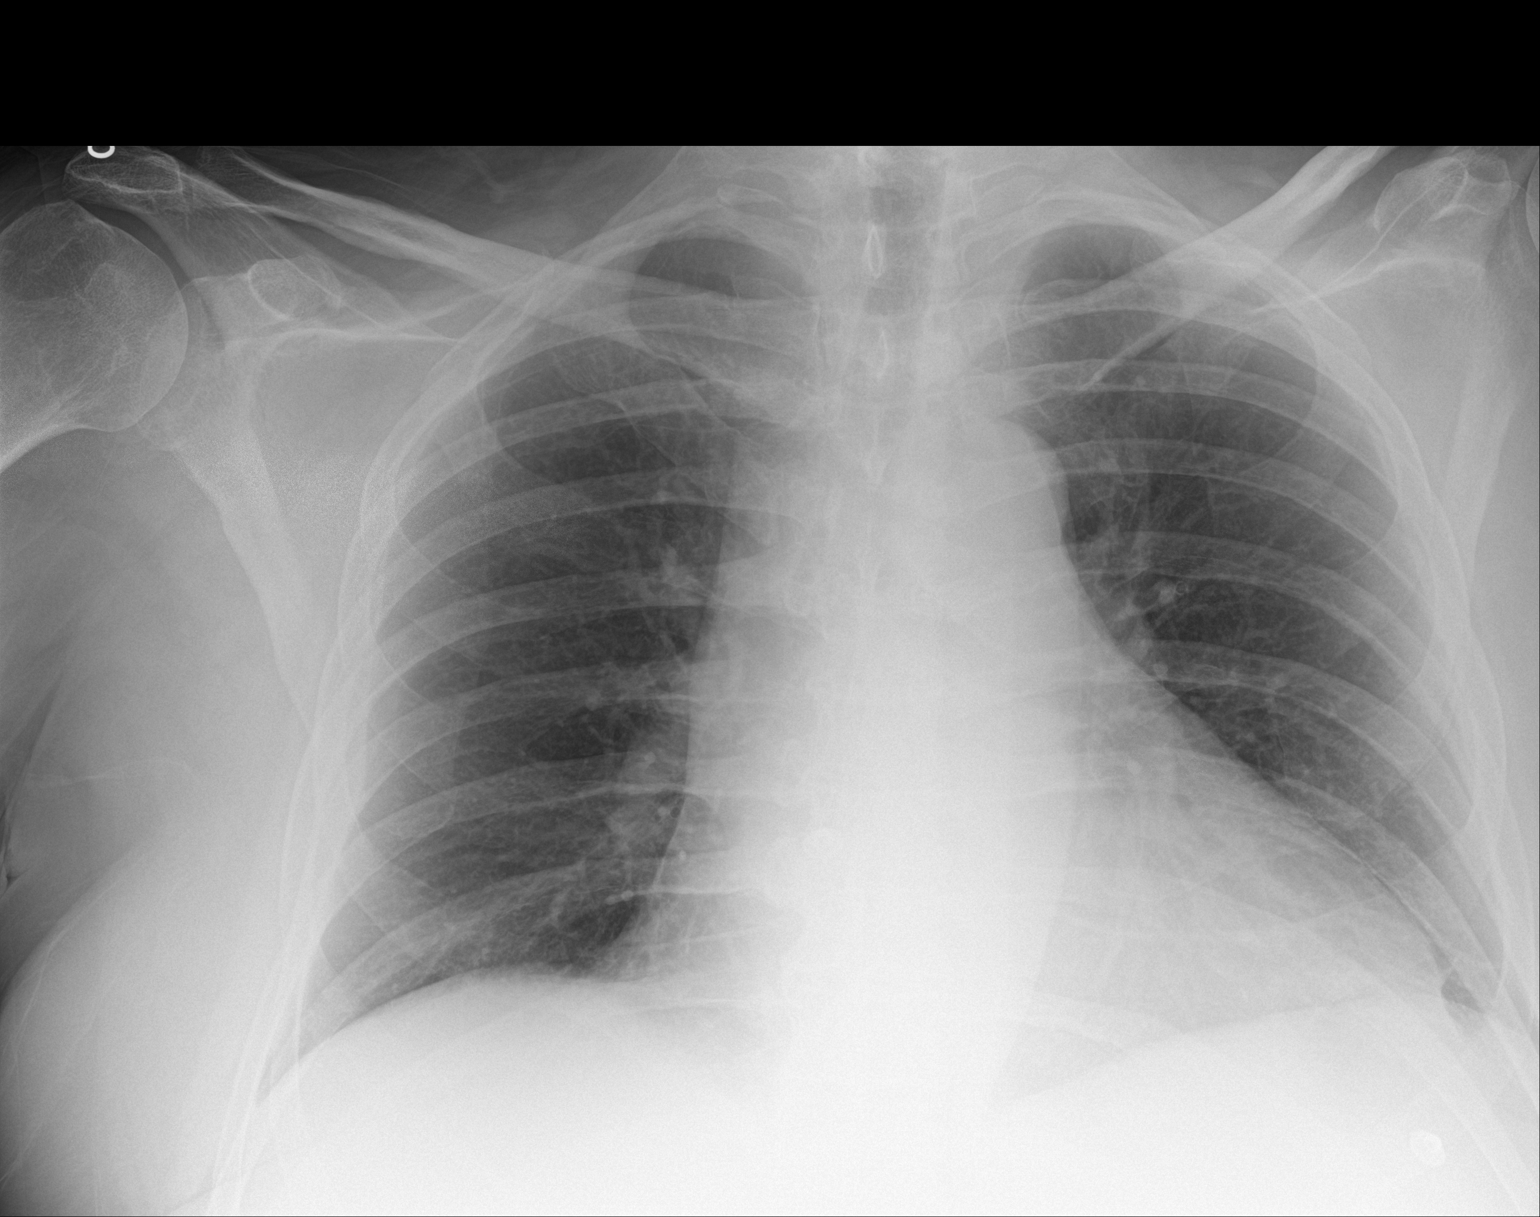

[2 of 2 positions shown; findings below may reference images not displayed]

FINDINGS: Shallow inspiration. The heart size and mediastinal contours are
within normal limits. Both lungs are clear. Degenerative changes in
the spine with bridging anterior osteophytes, possibly ankylosing
spondylitis.
IMPRESSION: No active cardiopulmonary disease.

## 2016-09-13 IMAGING — MR MR MRA HEAD W/O CM
9 of 12 series · 26 of 48 positions shown · non-contrast
Comparison: Prior CT from earlier the same day.

CLINICAL DATA: Initial evaluation for acute left leg weakness and
gait instability.

EXAM:
MRI HEAD WITHOUT CONTRAST
MRA HEAD WITHOUT CONTRAST
TECHNIQUE: Multiplanar, multiecho pulse sequences of the brain and surrounding
structures were obtained without intravenous contrast. Angiographic
images of the head were obtained using MRA technique without
contrast.

[Series 3: DWI · axial · 3.0mm · 0.94mm/px · z∈[-99,+55]mm · 6 of 106 slices shown (1 of 4)]
[im 1/106]
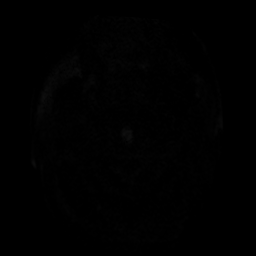
[im 22/106]
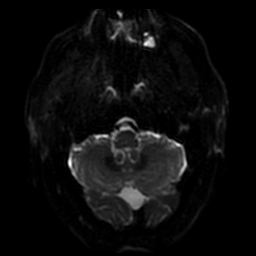
[im 43/106]
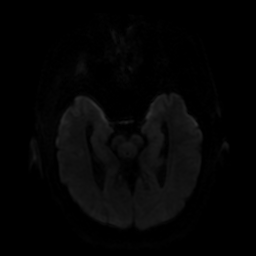
[im 64/106]
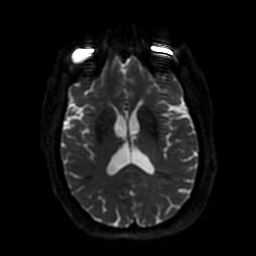
[im 85/106]
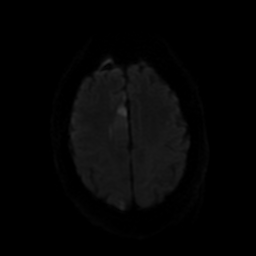
[im 106/106]
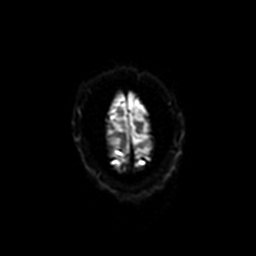

[Series 4: FLAIR · sagittal · 5.0mm · 0.47mm/px · 1 of 24 slices shown (1 of 2)]
[im 1/24]
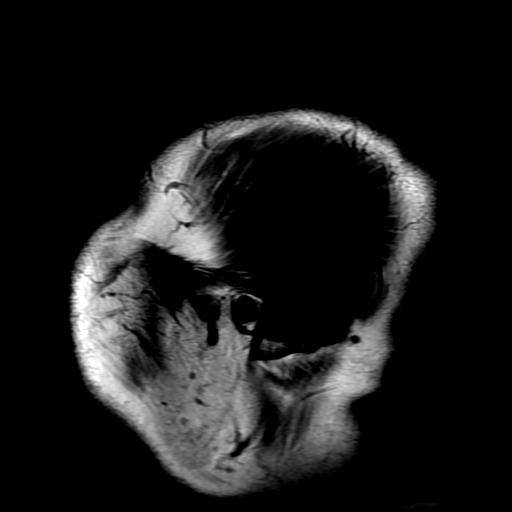

[Series 5: T2 · axial · 5.0mm · 0.47mm/px · z∈[-76,+66]mm · 2 of 26 slices shown (1 of 2)]
[im 1/26]
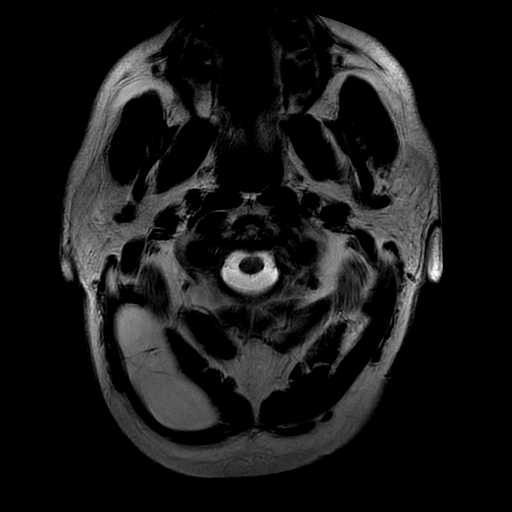
[im 26/26]
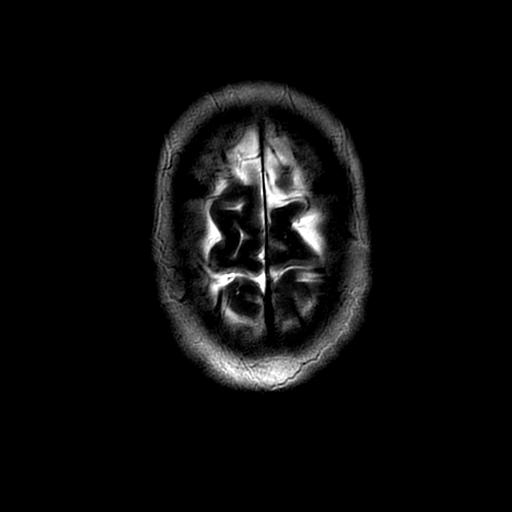

[Series 6: ax (id) 2 · axial · 1.2mm · 0.47mm/px · z∈[-81,-38]mm · 4 of 188 slices shown]
[im 1/188]
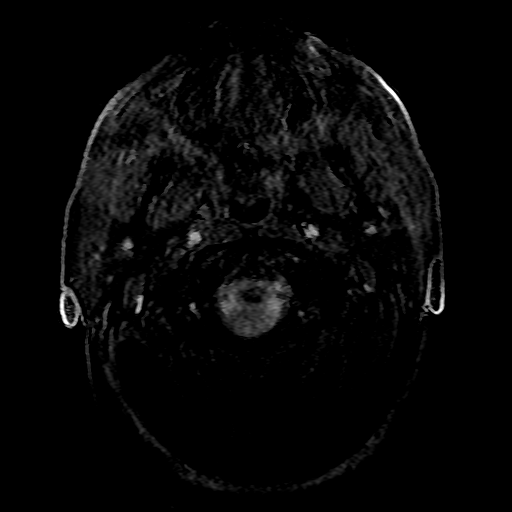
[im 38/188]
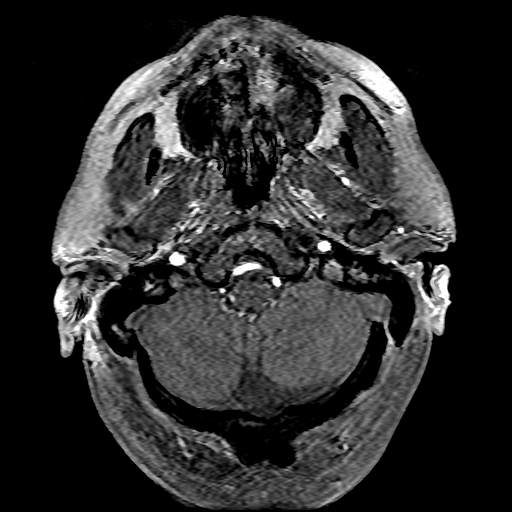
[im 57/188]
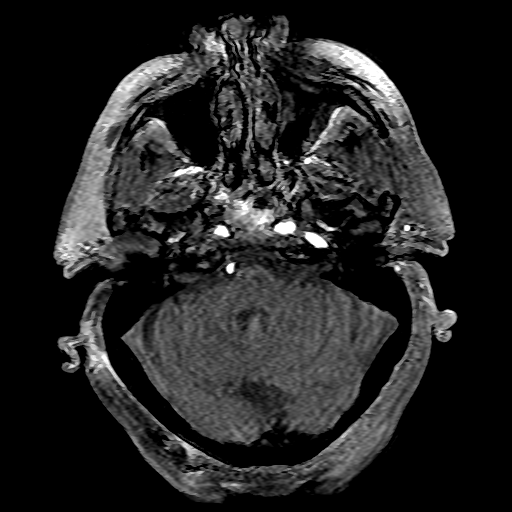
[im 75/188]
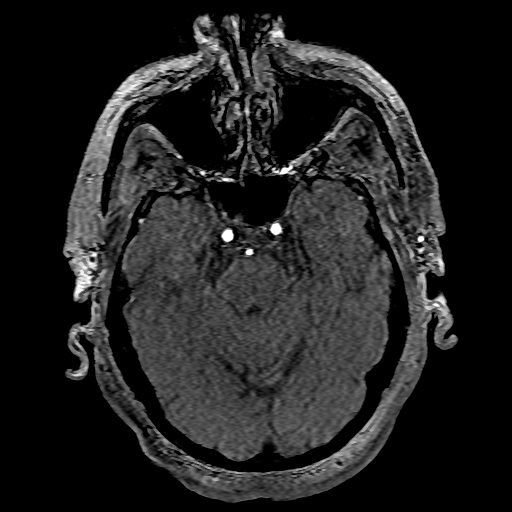

[Series 7: DWI · coronal · 5.0mm · 0.94mm/px · 4 of 68 slices shown (2 of 4)]
[im 1/68]
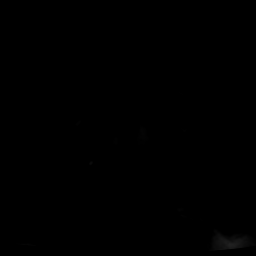
[im 23/68]
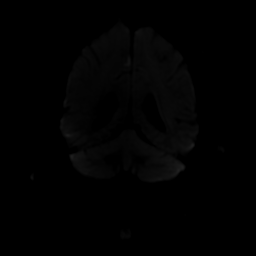
[im 45/68]
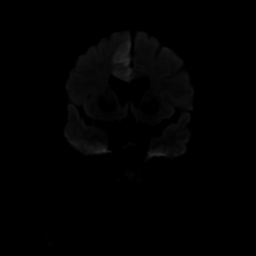
[im 68/68]
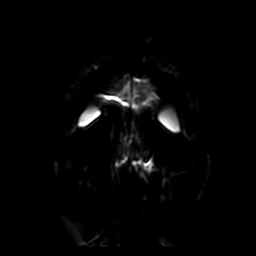

[Series 9: FLAIR · axial · 5.0mm · 0.47mm/px · z∈[-76,+66]mm · 2 of 26 slices shown (2 of 2)]
[im 1/26]
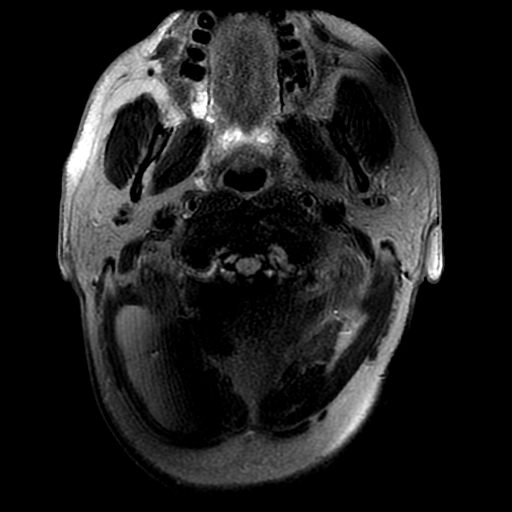
[im 26/26]
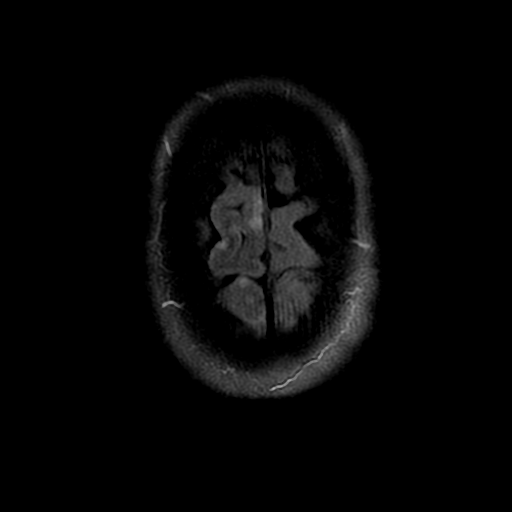

[Series 12: T2 · coronal · 5.0mm · 0.39mm/px · 2 of 29 slices shown (2 of 2)]
[im 1/29]
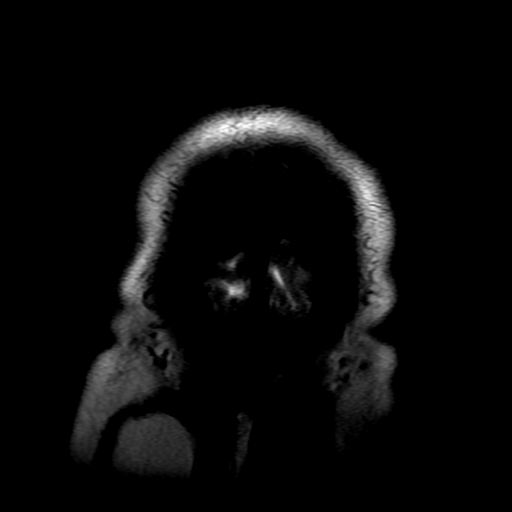
[im 29/29]
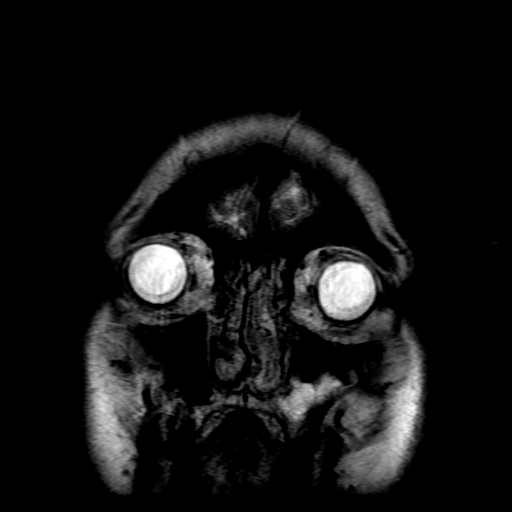

[Series 300: DWI · axial · 3.0mm · 0.94mm/px · z∈[-99,+55]mm · 3 of 53 slices shown (3 of 4)]
[im 1/53]
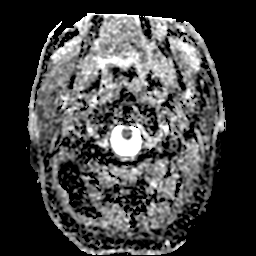
[im 27/53]
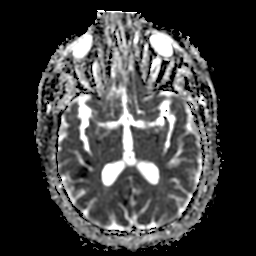
[im 53/53]
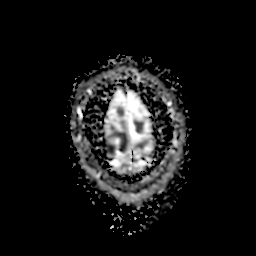

[Series 700: DWI · coronal · 5.0mm · 0.94mm/px · 2 of 33 slices shown (4 of 4)]
[im 1/33]
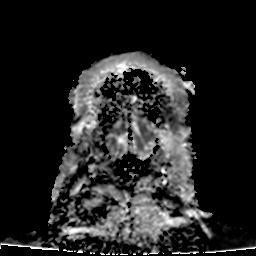
[im 33/33]
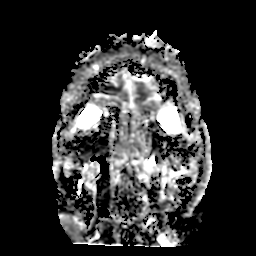

[26 of 48 positions shown; findings below may reference images not displayed]

FINDINGS: MRI HEAD FINDINGS

Diffuse prominence of the CSF containing spaces is compatible with
generalized age-related cerebral atrophy. Mild patchy T2/FLAIR
hyperintensity within the periventricular and deep white matter both
cerebral hemispheres noted, most consistent with chronic small
vessel ischemic changes, fairly mild for patient age. Probable small
remote lacunar infarcts within the bilateral basal ganglia.

There is abnormal restricted diffusion involving the parasagittal
right frontal lobe, consistent with acute right ACA territory
infarct. There is involvement of the right body of the corpus
callosum. Additional patchy cortical infarcts present within the
anterior right frontal lobe as well as the right parietal lobe, also
likely right ACA in distribution. No significant mass effect. There
are associated scattered areas of petechial hemorrhage within the
area of infarction.

No mass lesion, mass effect, or midline shift.  No hydrocephalus.

Craniocervical junction within normal limits. Pituitary gland
normal.

No acute abnormality about the orbits.

Mild mucosal thickening present within left maxillary and left
sphenoid sinuses. Paranasal sinuses are otherwise clear. No mastoid
effusion. Inner ear structures normal.

Bone marrow signal intensity within normal limits. Scalp soft
tissues unremarkable. Probable intramuscular lipoma noted within the
right posterior neck.

MRA HEAD FINDINGS

ANTERIOR CIRCULATION:

Study is degraded by motion artifact.

Distal cervical segments of the internal carotid arteries are widely
patent with antegrade flow. The petrous cavernous, and supra clinoid
segments are widely patent. A1 segments are well opacified. Anterior
communicating artery normal.

There is multi focal irregularity and stenosis within the right A2
segment which is overall diminutive as compared to the left.
Additionally, there is suggestion of either occlusive or near
occlusive thrombus within the right A2 segment with blooming
artifact seen in this region on gradient echo sequence (series [DATE],
image 72). There is faint opacification of the right A2 segment
distally. The left A2 segment demonstrates to focal moderate to
severe stenoses, 1 at its base near the anterior communicating
artery, the other more distally within the left A2 segment (Series
603, image 4).

There is a focal high-grade short-segment stenosis within the mid
right M1 segment (series 602, image 7). Left M1 segment widely
patent. MCA bifurcations within normal limits. Probable
short-segment moderate stenosis within an inferior left M2 branch
(series 606, image 12). Scattered atheromatous irregularity seen
throughout the MCA branches.

POSTERIOR CIRCULATION:

Left vertebral artery is dominant and widely patent to the
vertebrobasilar junction. Multi focal irregularity seen within the
diminutive right vertebral artery which appears to terminate in
PICA. Mild to moderate multi focal atheromatous irregularity present
within the basilar artery which is somewhat narrowed at its mid and
distal aspects. Superior cerebellar arteries are opacified
proximally. There appears to be fetal origin of the right PCA with a
widely patent right posterior communicating artery. Left P1 segment
widely patent. Distal left PCA branches are attenuated as compared
to the right with multi focal atheromatous irregularity. There may
be chronic occlusion of the distal left PCA branches given the
absence of acute infarct in this territory on today's study.

No aneurysm or vascular malformation.
IMPRESSION: MRI HEAD IMPRESSION:

1. Acute right ACA territory infarct involving the parasagittal
right frontal lobe, body of the right corpus callosum, with
additional patchy cortical infarcts involving the right frontal and
parietal cortex. There are scattered foci of associated petechial
hemorrhage without frank hemorrhagic conversion. No significant mass
effect.
2. Mild age-related generalized cerebral atrophy with chronic small
vessel ischemic disease and small remote lacunar infarcts involving
the bilateral basal ganglia.

MRA HEAD IMPRESSION:

1. Occlusive versus near occlusive thrombus within the right A2
segment, compatible with right ACA territory infarct.
2. Short-segment high-grade stenosis within the right M1 segment.
3. Additional multi focal atheromatous irregularity with stenoses
throughout the intracranial circulation as detailed above.

## 2016-10-03 DIAGNOSIS — Z79899 Other long term (current) drug therapy: Secondary | ICD-10-CM | POA: Diagnosis not present

## 2016-10-14 DIAGNOSIS — E0840 Diabetes mellitus due to underlying condition with diabetic neuropathy, unspecified: Secondary | ICD-10-CM | POA: Diagnosis not present

## 2016-10-14 DIAGNOSIS — I69959 Hemiplegia and hemiparesis following unspecified cerebrovascular disease affecting unspecified side: Secondary | ICD-10-CM | POA: Diagnosis not present

## 2016-10-14 DIAGNOSIS — I638 Other cerebral infarction: Secondary | ICD-10-CM | POA: Diagnosis not present

## 2016-10-14 DIAGNOSIS — R001 Bradycardia, unspecified: Secondary | ICD-10-CM | POA: Diagnosis not present

## 2016-10-14 DIAGNOSIS — I129 Hypertensive chronic kidney disease with stage 1 through stage 4 chronic kidney disease, or unspecified chronic kidney disease: Secondary | ICD-10-CM | POA: Diagnosis not present

## 2016-10-14 DIAGNOSIS — I480 Paroxysmal atrial fibrillation: Secondary | ICD-10-CM | POA: Diagnosis not present

## 2016-11-01 DIAGNOSIS — B351 Tinea unguium: Secondary | ICD-10-CM | POA: Diagnosis not present

## 2016-11-01 DIAGNOSIS — I739 Peripheral vascular disease, unspecified: Secondary | ICD-10-CM | POA: Diagnosis not present

## 2016-11-01 DIAGNOSIS — Z79899 Other long term (current) drug therapy: Secondary | ICD-10-CM | POA: Diagnosis not present

## 2016-11-01 DIAGNOSIS — R600 Localized edema: Secondary | ICD-10-CM | POA: Diagnosis not present

## 2016-11-16 DIAGNOSIS — I480 Paroxysmal atrial fibrillation: Secondary | ICD-10-CM | POA: Diagnosis not present

## 2016-11-16 DIAGNOSIS — E0840 Diabetes mellitus due to underlying condition with diabetic neuropathy, unspecified: Secondary | ICD-10-CM | POA: Diagnosis not present

## 2016-11-16 DIAGNOSIS — R001 Bradycardia, unspecified: Secondary | ICD-10-CM | POA: Diagnosis not present

## 2016-11-16 DIAGNOSIS — I638 Other cerebral infarction: Secondary | ICD-10-CM | POA: Diagnosis not present

## 2016-11-16 DIAGNOSIS — I69959 Hemiplegia and hemiparesis following unspecified cerebrovascular disease affecting unspecified side: Secondary | ICD-10-CM | POA: Diagnosis not present

## 2016-11-16 DIAGNOSIS — I129 Hypertensive chronic kidney disease with stage 1 through stage 4 chronic kidney disease, or unspecified chronic kidney disease: Secondary | ICD-10-CM | POA: Diagnosis not present

## 2016-11-21 DIAGNOSIS — Z79899 Other long term (current) drug therapy: Secondary | ICD-10-CM | POA: Diagnosis not present

## 2016-11-21 DIAGNOSIS — E0829 Diabetes mellitus due to underlying condition with other diabetic kidney complication: Secondary | ICD-10-CM | POA: Diagnosis not present

## 2016-11-21 DIAGNOSIS — M109 Gout, unspecified: Secondary | ICD-10-CM | POA: Diagnosis not present

## 2016-11-21 DIAGNOSIS — E785 Hyperlipidemia, unspecified: Secondary | ICD-10-CM | POA: Diagnosis not present

## 2016-12-02 DIAGNOSIS — Z79899 Other long term (current) drug therapy: Secondary | ICD-10-CM | POA: Diagnosis not present

## 2017-01-02 DIAGNOSIS — Z79899 Other long term (current) drug therapy: Secondary | ICD-10-CM | POA: Diagnosis not present

## 2017-01-11 DIAGNOSIS — I129 Hypertensive chronic kidney disease with stage 1 through stage 4 chronic kidney disease, or unspecified chronic kidney disease: Secondary | ICD-10-CM | POA: Diagnosis not present

## 2017-01-11 DIAGNOSIS — I480 Paroxysmal atrial fibrillation: Secondary | ICD-10-CM | POA: Diagnosis not present

## 2017-01-11 DIAGNOSIS — E0840 Diabetes mellitus due to underlying condition with diabetic neuropathy, unspecified: Secondary | ICD-10-CM | POA: Diagnosis not present

## 2017-01-11 DIAGNOSIS — R001 Bradycardia, unspecified: Secondary | ICD-10-CM | POA: Diagnosis not present

## 2017-01-11 DIAGNOSIS — I6389 Other cerebral infarction: Secondary | ICD-10-CM | POA: Diagnosis not present

## 2017-01-11 DIAGNOSIS — I69959 Hemiplegia and hemiparesis following unspecified cerebrovascular disease affecting unspecified side: Secondary | ICD-10-CM | POA: Diagnosis not present

## 2017-01-16 DIAGNOSIS — M1009 Idiopathic gout, multiple sites: Secondary | ICD-10-CM | POA: Diagnosis not present

## 2017-01-31 DIAGNOSIS — I69354 Hemiplegia and hemiparesis following cerebral infarction affecting left non-dominant side: Secondary | ICD-10-CM | POA: Diagnosis not present

## 2017-01-31 DIAGNOSIS — I69320 Aphasia following cerebral infarction: Secondary | ICD-10-CM | POA: Diagnosis not present

## 2017-01-31 DIAGNOSIS — E119 Type 2 diabetes mellitus without complications: Secondary | ICD-10-CM | POA: Diagnosis not present

## 2017-01-31 DIAGNOSIS — I10 Essential (primary) hypertension: Secondary | ICD-10-CM | POA: Diagnosis not present

## 2017-01-31 DIAGNOSIS — M170 Bilateral primary osteoarthritis of knee: Secondary | ICD-10-CM | POA: Diagnosis not present

## 2017-01-31 DIAGNOSIS — M1009 Idiopathic gout, multiple sites: Secondary | ICD-10-CM | POA: Diagnosis not present

## 2017-01-31 DIAGNOSIS — M6281 Muscle weakness (generalized): Secondary | ICD-10-CM | POA: Diagnosis not present

## 2017-02-01 DIAGNOSIS — M6281 Muscle weakness (generalized): Secondary | ICD-10-CM | POA: Diagnosis not present

## 2017-02-01 DIAGNOSIS — M170 Bilateral primary osteoarthritis of knee: Secondary | ICD-10-CM | POA: Diagnosis not present

## 2017-02-01 DIAGNOSIS — I69354 Hemiplegia and hemiparesis following cerebral infarction affecting left non-dominant side: Secondary | ICD-10-CM | POA: Diagnosis not present

## 2017-02-01 DIAGNOSIS — M1009 Idiopathic gout, multiple sites: Secondary | ICD-10-CM | POA: Diagnosis not present

## 2017-02-01 DIAGNOSIS — I69320 Aphasia following cerebral infarction: Secondary | ICD-10-CM | POA: Diagnosis not present

## 2017-02-01 DIAGNOSIS — Z79899 Other long term (current) drug therapy: Secondary | ICD-10-CM | POA: Diagnosis not present

## 2017-02-01 DIAGNOSIS — I10 Essential (primary) hypertension: Secondary | ICD-10-CM | POA: Diagnosis not present

## 2017-02-02 DIAGNOSIS — M170 Bilateral primary osteoarthritis of knee: Secondary | ICD-10-CM | POA: Diagnosis not present

## 2017-02-02 DIAGNOSIS — I69320 Aphasia following cerebral infarction: Secondary | ICD-10-CM | POA: Diagnosis not present

## 2017-02-02 DIAGNOSIS — I10 Essential (primary) hypertension: Secondary | ICD-10-CM | POA: Diagnosis not present

## 2017-02-02 DIAGNOSIS — M6281 Muscle weakness (generalized): Secondary | ICD-10-CM | POA: Diagnosis not present

## 2017-02-02 DIAGNOSIS — I69354 Hemiplegia and hemiparesis following cerebral infarction affecting left non-dominant side: Secondary | ICD-10-CM | POA: Diagnosis not present

## 2017-02-02 DIAGNOSIS — M1009 Idiopathic gout, multiple sites: Secondary | ICD-10-CM | POA: Diagnosis not present

## 2017-02-07 DIAGNOSIS — I69354 Hemiplegia and hemiparesis following cerebral infarction affecting left non-dominant side: Secondary | ICD-10-CM | POA: Diagnosis not present

## 2017-02-07 DIAGNOSIS — E119 Type 2 diabetes mellitus without complications: Secondary | ICD-10-CM | POA: Diagnosis not present

## 2017-02-07 DIAGNOSIS — M170 Bilateral primary osteoarthritis of knee: Secondary | ICD-10-CM | POA: Diagnosis not present

## 2017-02-07 DIAGNOSIS — I69320 Aphasia following cerebral infarction: Secondary | ICD-10-CM | POA: Diagnosis not present

## 2017-02-07 DIAGNOSIS — M1009 Idiopathic gout, multiple sites: Secondary | ICD-10-CM | POA: Diagnosis not present

## 2017-02-07 DIAGNOSIS — M6281 Muscle weakness (generalized): Secondary | ICD-10-CM | POA: Diagnosis not present

## 2017-02-07 DIAGNOSIS — I10 Essential (primary) hypertension: Secondary | ICD-10-CM | POA: Diagnosis not present

## 2017-02-08 DIAGNOSIS — I69354 Hemiplegia and hemiparesis following cerebral infarction affecting left non-dominant side: Secondary | ICD-10-CM | POA: Diagnosis not present

## 2017-02-08 DIAGNOSIS — M6281 Muscle weakness (generalized): Secondary | ICD-10-CM | POA: Diagnosis not present

## 2017-02-08 DIAGNOSIS — I69320 Aphasia following cerebral infarction: Secondary | ICD-10-CM | POA: Diagnosis not present

## 2017-02-08 DIAGNOSIS — M1009 Idiopathic gout, multiple sites: Secondary | ICD-10-CM | POA: Diagnosis not present

## 2017-02-08 DIAGNOSIS — M170 Bilateral primary osteoarthritis of knee: Secondary | ICD-10-CM | POA: Diagnosis not present

## 2017-02-08 DIAGNOSIS — I10 Essential (primary) hypertension: Secondary | ICD-10-CM | POA: Diagnosis not present

## 2017-02-10 DIAGNOSIS — M170 Bilateral primary osteoarthritis of knee: Secondary | ICD-10-CM | POA: Diagnosis not present

## 2017-02-10 DIAGNOSIS — M1009 Idiopathic gout, multiple sites: Secondary | ICD-10-CM | POA: Diagnosis not present

## 2017-02-10 DIAGNOSIS — I69354 Hemiplegia and hemiparesis following cerebral infarction affecting left non-dominant side: Secondary | ICD-10-CM | POA: Diagnosis not present

## 2017-02-10 DIAGNOSIS — I69320 Aphasia following cerebral infarction: Secondary | ICD-10-CM | POA: Diagnosis not present

## 2017-02-10 DIAGNOSIS — M6281 Muscle weakness (generalized): Secondary | ICD-10-CM | POA: Diagnosis not present

## 2017-02-10 DIAGNOSIS — I10 Essential (primary) hypertension: Secondary | ICD-10-CM | POA: Diagnosis not present

## 2017-02-14 DIAGNOSIS — I10 Essential (primary) hypertension: Secondary | ICD-10-CM | POA: Diagnosis not present

## 2017-02-14 DIAGNOSIS — I69320 Aphasia following cerebral infarction: Secondary | ICD-10-CM | POA: Diagnosis not present

## 2017-02-14 DIAGNOSIS — I69354 Hemiplegia and hemiparesis following cerebral infarction affecting left non-dominant side: Secondary | ICD-10-CM | POA: Diagnosis not present

## 2017-02-14 DIAGNOSIS — M6281 Muscle weakness (generalized): Secondary | ICD-10-CM | POA: Diagnosis not present

## 2017-02-14 DIAGNOSIS — M1009 Idiopathic gout, multiple sites: Secondary | ICD-10-CM | POA: Diagnosis not present

## 2017-02-14 DIAGNOSIS — M170 Bilateral primary osteoarthritis of knee: Secondary | ICD-10-CM | POA: Diagnosis not present

## 2017-02-15 DIAGNOSIS — M1009 Idiopathic gout, multiple sites: Secondary | ICD-10-CM | POA: Diagnosis not present

## 2017-02-15 DIAGNOSIS — I69354 Hemiplegia and hemiparesis following cerebral infarction affecting left non-dominant side: Secondary | ICD-10-CM | POA: Diagnosis not present

## 2017-02-15 DIAGNOSIS — M170 Bilateral primary osteoarthritis of knee: Secondary | ICD-10-CM | POA: Diagnosis not present

## 2017-02-15 DIAGNOSIS — I10 Essential (primary) hypertension: Secondary | ICD-10-CM | POA: Diagnosis not present

## 2017-02-15 DIAGNOSIS — M6281 Muscle weakness (generalized): Secondary | ICD-10-CM | POA: Diagnosis not present

## 2017-02-15 DIAGNOSIS — I69320 Aphasia following cerebral infarction: Secondary | ICD-10-CM | POA: Diagnosis not present

## 2017-02-16 DIAGNOSIS — I69320 Aphasia following cerebral infarction: Secondary | ICD-10-CM | POA: Diagnosis not present

## 2017-02-16 DIAGNOSIS — M6281 Muscle weakness (generalized): Secondary | ICD-10-CM | POA: Diagnosis not present

## 2017-02-16 DIAGNOSIS — M170 Bilateral primary osteoarthritis of knee: Secondary | ICD-10-CM | POA: Diagnosis not present

## 2017-02-16 DIAGNOSIS — I10 Essential (primary) hypertension: Secondary | ICD-10-CM | POA: Diagnosis not present

## 2017-02-16 DIAGNOSIS — M1009 Idiopathic gout, multiple sites: Secondary | ICD-10-CM | POA: Diagnosis not present

## 2017-02-16 DIAGNOSIS — I69354 Hemiplegia and hemiparesis following cerebral infarction affecting left non-dominant side: Secondary | ICD-10-CM | POA: Diagnosis not present

## 2017-02-20 DIAGNOSIS — E785 Hyperlipidemia, unspecified: Secondary | ICD-10-CM | POA: Diagnosis not present

## 2017-02-20 DIAGNOSIS — M170 Bilateral primary osteoarthritis of knee: Secondary | ICD-10-CM | POA: Diagnosis not present

## 2017-02-20 DIAGNOSIS — I69354 Hemiplegia and hemiparesis following cerebral infarction affecting left non-dominant side: Secondary | ICD-10-CM | POA: Diagnosis not present

## 2017-02-20 DIAGNOSIS — I69320 Aphasia following cerebral infarction: Secondary | ICD-10-CM | POA: Diagnosis not present

## 2017-02-20 DIAGNOSIS — M6281 Muscle weakness (generalized): Secondary | ICD-10-CM | POA: Diagnosis not present

## 2017-02-20 DIAGNOSIS — M1009 Idiopathic gout, multiple sites: Secondary | ICD-10-CM | POA: Diagnosis not present

## 2017-02-20 DIAGNOSIS — E0829 Diabetes mellitus due to underlying condition with other diabetic kidney complication: Secondary | ICD-10-CM | POA: Diagnosis not present

## 2017-02-20 DIAGNOSIS — I10 Essential (primary) hypertension: Secondary | ICD-10-CM | POA: Diagnosis not present

## 2017-02-22 DIAGNOSIS — M170 Bilateral primary osteoarthritis of knee: Secondary | ICD-10-CM | POA: Diagnosis not present

## 2017-02-22 DIAGNOSIS — M1009 Idiopathic gout, multiple sites: Secondary | ICD-10-CM | POA: Diagnosis not present

## 2017-02-22 DIAGNOSIS — I69320 Aphasia following cerebral infarction: Secondary | ICD-10-CM | POA: Diagnosis not present

## 2017-02-22 DIAGNOSIS — M6281 Muscle weakness (generalized): Secondary | ICD-10-CM | POA: Diagnosis not present

## 2017-02-22 DIAGNOSIS — I69354 Hemiplegia and hemiparesis following cerebral infarction affecting left non-dominant side: Secondary | ICD-10-CM | POA: Diagnosis not present

## 2017-02-22 DIAGNOSIS — I10 Essential (primary) hypertension: Secondary | ICD-10-CM | POA: Diagnosis not present

## 2017-02-24 DIAGNOSIS — M1009 Idiopathic gout, multiple sites: Secondary | ICD-10-CM | POA: Diagnosis not present

## 2017-02-24 DIAGNOSIS — I10 Essential (primary) hypertension: Secondary | ICD-10-CM | POA: Diagnosis not present

## 2017-02-24 DIAGNOSIS — M6281 Muscle weakness (generalized): Secondary | ICD-10-CM | POA: Diagnosis not present

## 2017-02-24 DIAGNOSIS — M170 Bilateral primary osteoarthritis of knee: Secondary | ICD-10-CM | POA: Diagnosis not present

## 2017-02-24 DIAGNOSIS — I69354 Hemiplegia and hemiparesis following cerebral infarction affecting left non-dominant side: Secondary | ICD-10-CM | POA: Diagnosis not present

## 2017-02-24 DIAGNOSIS — I69320 Aphasia following cerebral infarction: Secondary | ICD-10-CM | POA: Diagnosis not present

## 2017-03-01 DIAGNOSIS — I10 Essential (primary) hypertension: Secondary | ICD-10-CM | POA: Diagnosis not present

## 2017-03-01 DIAGNOSIS — I69320 Aphasia following cerebral infarction: Secondary | ICD-10-CM | POA: Diagnosis not present

## 2017-03-01 DIAGNOSIS — M1009 Idiopathic gout, multiple sites: Secondary | ICD-10-CM | POA: Diagnosis not present

## 2017-03-01 DIAGNOSIS — M170 Bilateral primary osteoarthritis of knee: Secondary | ICD-10-CM | POA: Diagnosis not present

## 2017-03-01 DIAGNOSIS — M6281 Muscle weakness (generalized): Secondary | ICD-10-CM | POA: Diagnosis not present

## 2017-03-01 DIAGNOSIS — I69354 Hemiplegia and hemiparesis following cerebral infarction affecting left non-dominant side: Secondary | ICD-10-CM | POA: Diagnosis not present

## 2017-03-02 DIAGNOSIS — M1009 Idiopathic gout, multiple sites: Secondary | ICD-10-CM | POA: Diagnosis not present

## 2017-03-02 DIAGNOSIS — I10 Essential (primary) hypertension: Secondary | ICD-10-CM | POA: Diagnosis not present

## 2017-03-02 DIAGNOSIS — M170 Bilateral primary osteoarthritis of knee: Secondary | ICD-10-CM | POA: Diagnosis not present

## 2017-03-02 DIAGNOSIS — I69354 Hemiplegia and hemiparesis following cerebral infarction affecting left non-dominant side: Secondary | ICD-10-CM | POA: Diagnosis not present

## 2017-03-02 DIAGNOSIS — M6281 Muscle weakness (generalized): Secondary | ICD-10-CM | POA: Diagnosis not present

## 2017-03-02 DIAGNOSIS — I69320 Aphasia following cerebral infarction: Secondary | ICD-10-CM | POA: Diagnosis not present

## 2017-03-03 DIAGNOSIS — Z79899 Other long term (current) drug therapy: Secondary | ICD-10-CM | POA: Diagnosis not present

## 2017-03-04 DIAGNOSIS — M170 Bilateral primary osteoarthritis of knee: Secondary | ICD-10-CM | POA: Diagnosis not present

## 2017-03-04 DIAGNOSIS — M1009 Idiopathic gout, multiple sites: Secondary | ICD-10-CM | POA: Diagnosis not present

## 2017-03-04 DIAGNOSIS — I69354 Hemiplegia and hemiparesis following cerebral infarction affecting left non-dominant side: Secondary | ICD-10-CM | POA: Diagnosis not present

## 2017-03-04 DIAGNOSIS — I10 Essential (primary) hypertension: Secondary | ICD-10-CM | POA: Diagnosis not present

## 2017-03-04 DIAGNOSIS — M6281 Muscle weakness (generalized): Secondary | ICD-10-CM | POA: Diagnosis not present

## 2017-03-04 DIAGNOSIS — I69320 Aphasia following cerebral infarction: Secondary | ICD-10-CM | POA: Diagnosis not present

## 2017-07-17 ENCOUNTER — Telehealth: Payer: Self-pay | Admitting: Internal Medicine

## 2017-07-17 NOTE — Telephone Encounter (Signed)
FYI

## 2017-07-17 NOTE — Telephone Encounter (Signed)
Copied from [place] [phone]. Topic: Quick Communication - See Telephone Encounter >> Jul 17, 2017  2:10 PM Bea Graff, NT wrote: CRM for notification. See Telephone encounter for: 07/17/17. Pt calling and states that he is going to need a prescription for diabetes management equipment. He states medicare should be sending over the order. Also, he would like Dr. Jenny Reichmann know that his wife passed away from dementia on 10-11-16. He is now a patient at [facility] and he has had a stroke and is unable to walk. An ambulance had to take him to the hospital because he fell and was unable to move when the stroke happened.

## 2017-07-27 NOTE — Telephone Encounter (Signed)
I believe he has an MD at [facility], who may be able to assist with this, or else we would need a repeat fax

## 2017-07-27 NOTE — Telephone Encounter (Signed)
Please call "[facility]" back about getting diabetes testing supplies. They faxed a request about a week ago and haven't heard back.

## 2017-07-27 NOTE — Telephone Encounter (Signed)
Please advise I don't recall seeing this come by fax. Also, the pt's last OV was 06/03/14.

## 2017-08-16 ENCOUNTER — Telehealth: Payer: Self-pay | Admitting: Internal Medicine

## 2017-08-16 NOTE — Telephone Encounter (Signed)
Pt called. Stated we received a fax from [facility] requesting a knee brace. Pt stated that we faxed it back to them incomplete. Pt [phone]  Pt left this in my voicemail, this is all the info he gave.

## 2017-08-16 NOTE — Telephone Encounter (Signed)
Called pt, LVM inquiring about the form and wanted him to call back to discuss.

## 2017-08-29 ENCOUNTER — Ambulatory Visit: Payer: Medicare Other | Admitting: Internal Medicine

## 2017-08-29 ENCOUNTER — Telehealth: Payer: Self-pay | Admitting: Internal Medicine

## 2017-08-29 DIAGNOSIS — Z0289 Encounter for other administrative examinations: Secondary | ICD-10-CM

## 2017-08-29 NOTE — Telephone Encounter (Signed)
Patient no showed to his appt today to discuss back pain and need for brace.

## 2017-08-29 NOTE — Telephone Encounter (Signed)
Copied from [place] [phone]. Topic: Quick Communication - See Telephone Encounter >> Aug 29, 2017  1:37 PM Vernona Rieger wrote: CRM for notification. See Telephone encounter for: 08/29/17.  Gerald Stabs from [facility] called about a fax he sent over on 6/24 for a back brace. He is just checking the status of this. He would like to know when he will be getting information back on this?Call back @ [phone]

## 2017-09-01 ENCOUNTER — Telehealth: Payer: Self-pay | Admitting: Internal Medicine

## 2017-09-01 NOTE — Telephone Encounter (Signed)
Copied from [place] [phone]. Topic: Quick Communication - See Telephone Encounter >> Sep 01, 2017  3:15 PM Judyann Munson wrote: CRM for notification. See Telephone encounter for: Juan [name] called in from [facility] about a fax he sent over on 6/26 for a back brace, knee braces. She e is just checking the status of this. she would like to know when he will be getting information back on this? Call back @ [phone]

## 2017-09-04 NOTE — Telephone Encounter (Signed)
Spoke with patient and confirmed that he did request these supplies.  However the call back number listed for [facility] is not a working number. It doesn't ring, only silence once dialed.

## 2017-09-06 ENCOUNTER — Telehealth: Payer: Self-pay | Admitting: Internal Medicine

## 2017-09-06 NOTE — Telephone Encounter (Signed)
Copied from [place] [phone]. Topic: Quick Communication - See Telephone Encounter >> Sep 06, 2017  3:15 PM Burchel, Abbi R wrote: See Telephone encounter for: 09/06/17.  [place] [phone]  Following up on paperwork faxed 09/06/17 for Freestyle Libra Glucometer.

## 2017-09-06 NOTE — Telephone Encounter (Signed)
Was received today via fax and given to PCP. Will fax back once PCP has completed the form.

## 2017-09-08 NOTE — Telephone Encounter (Signed)
Form has been completed. However it is not fax number listed for it to be returned. I have reached out  to [place] [facility] leaving a VM to call me back with a fax number.

## 2017-09-27 NOTE — Telephone Encounter (Signed)
[facility] called to check to check if the fax was received for the pt's glucometer; contact if needed @ [phone]  Or fax the form back to [phone] or [phone] w/ office notes

## 2017-09-28 NOTE — Telephone Encounter (Signed)
I now believe that this is a scam. After calling the contact number to speak with someone it was called "[facility]" and from the original msg it was them called [facility]. I will not follow up.

## 2017-10-04 ENCOUNTER — Telehealth: Payer: Self-pay | Admitting: Internal Medicine

## 2017-10-04 NOTE — Telephone Encounter (Signed)
[facility] called to verify that the pt is receiving medical supplies from them; the pt was on the phone as well to verify about the diabetic supplies; contact pt or company [facility]: [phone]

## 2017-10-04 NOTE — Telephone Encounter (Signed)
Called and requested the forms to be re-faxed to [facility]

## 2017-10-04 NOTE — Telephone Encounter (Signed)
Copied from [place] [phone]. Topic: Inquiry >> Oct 04, 2017  2:06 PM Conception Chancy, NT wrote: Reason for CRM: Katharine Look is calling from [employer] and states that the page where it says practice practice profile at the bottom was not signed. Please advise.   Cb# [phone] Fax# [phone]

## 2017-10-05 NOTE — Telephone Encounter (Signed)
Form was faxed back earlier today

## 2017-10-05 NOTE — Telephone Encounter (Signed)
Benjamine Mola calling to verify fax received.  She is going to re fax to shirron attn asap

## 2017-10-11 ENCOUNTER — Telehealth: Payer: Self-pay | Admitting: Internal Medicine

## 2017-10-11 NOTE — Telephone Encounter (Signed)
Copied from [place] [phone]. Topic: General - Other >> Oct 11, 2017  4:27 PM Cecelia Byars, NT wrote: Reason for CRM: [facility] called and needs medical notes pertaining  to the patients diagnosis as well the number of testing done please call  Elizabeth at [phone]

## 2017-10-25 NOTE — Telephone Encounter (Signed)
Noted  

## 2017-10-25 NOTE — Telephone Encounter (Signed)
[facility] called and asked for 6 months of medical notes from the facility; contact [phone] to advise

## 2017-10-26 NOTE — Telephone Encounter (Signed)
[facility] calling again, needing records. Please advise.

## 2017-10-26 NOTE — Telephone Encounter (Signed)
Routed to medical records 

## 2017-11-01 ENCOUNTER — Telehealth: Payer: Self-pay | Admitting: Internal Medicine

## 2017-11-01 NOTE — Telephone Encounter (Signed)
Copied from [place] [phone]. Topic: Quick Communication - See Telephone Encounter >> Nov 01, 2017 12:59 PM Mylinda Latina, NT wrote: CRM for notification. See Telephone encounter for: 11/01/17. Elizabeth calling from [facility] states the Free style Elenor Legato PA was filled out incorrectly. Please fill this out correctly and provide clinical documentation  She states she faxed it on over on 10/02/17 and it was faxed back to her on 10/04/17. She is going to fax it back again so It can be completed it  correctly . Marland Kitchen  If you have any questions please call CB# [phone]

## 2017-11-01 NOTE — Telephone Encounter (Signed)
Noted  

## 2017-11-02 NOTE — Telephone Encounter (Signed)
Benjamine Mola called back and said she needs the medical documentation for the reason of diabetes. Fax number [phone]

## 2018-03-15 ENCOUNTER — Other Ambulatory Visit: Payer: Self-pay | Admitting: Internal Medicine

## 2019-03-12 ENCOUNTER — Encounter: Payer: Self-pay | Admitting: Internal Medicine

## 2019-03-12 DIAGNOSIS — Z86010 Personal history of colonic polyps: Secondary | ICD-10-CM

## 2020-06-05 DEATH — deceased
